# Patient Record
Sex: Male | Born: 1945 | Race: White | Hispanic: No | Marital: Married | Smoking: Former smoker
Health system: Southern US, Community
[De-identification: ages and names within clinical notes are randomized; demographics above are authoritative.]

## PROBLEM LIST (undated history)

## (undated) DIAGNOSIS — I209 Angina pectoris, unspecified: Secondary | ICD-10-CM

## (undated) DIAGNOSIS — K922 Gastrointestinal hemorrhage, unspecified: Secondary | ICD-10-CM

## (undated) DIAGNOSIS — R609 Edema, unspecified: Secondary | ICD-10-CM

## (undated) DIAGNOSIS — E119 Type 2 diabetes mellitus without complications: Secondary | ICD-10-CM

## (undated) DIAGNOSIS — I509 Heart failure, unspecified: Secondary | ICD-10-CM

## (undated) DIAGNOSIS — E039 Hypothyroidism, unspecified: Secondary | ICD-10-CM

## (undated) DIAGNOSIS — I890 Lymphedema, not elsewhere classified: Secondary | ICD-10-CM

## (undated) DIAGNOSIS — I2510 Atherosclerotic heart disease of native coronary artery without angina pectoris: Secondary | ICD-10-CM

## (undated) DIAGNOSIS — K219 Gastro-esophageal reflux disease without esophagitis: Secondary | ICD-10-CM

## (undated) DIAGNOSIS — E860 Dehydration: Secondary | ICD-10-CM

## (undated) DIAGNOSIS — I219 Acute myocardial infarction, unspecified: Secondary | ICD-10-CM

## (undated) DIAGNOSIS — G4730 Sleep apnea, unspecified: Secondary | ICD-10-CM

## (undated) DIAGNOSIS — I4891 Unspecified atrial fibrillation: Secondary | ICD-10-CM

## (undated) DIAGNOSIS — I10 Essential (primary) hypertension: Secondary | ICD-10-CM

## (undated) DIAGNOSIS — G2581 Restless legs syndrome: Secondary | ICD-10-CM

## (undated) DIAGNOSIS — F32A Depression, unspecified: Secondary | ICD-10-CM

## (undated) DIAGNOSIS — F329 Major depressive disorder, single episode, unspecified: Secondary | ICD-10-CM

## (undated) HISTORY — PX: CARPAL TUNNEL RELEASE: SHX101

## (undated) HISTORY — PX: COSMETIC SURGERY: SHX468

## (undated) HISTORY — PX: GASTRIC BYPASS: SHX52

## (undated) HISTORY — PX: PACEMAKER INSERTION: SHX728

---

## 2010-02-10 DIAGNOSIS — I2510 Atherosclerotic heart disease of native coronary artery without angina pectoris: Secondary | ICD-10-CM | POA: Insufficient documentation

## 2011-03-23 DIAGNOSIS — K922 Gastrointestinal hemorrhage, unspecified: Secondary | ICD-10-CM | POA: Insufficient documentation

## 2013-02-14 DIAGNOSIS — I4891 Unspecified atrial fibrillation: Secondary | ICD-10-CM | POA: Insufficient documentation

## 2013-02-23 DIAGNOSIS — D509 Iron deficiency anemia, unspecified: Secondary | ICD-10-CM | POA: Insufficient documentation

## 2013-03-12 DIAGNOSIS — I5032 Chronic diastolic (congestive) heart failure: Secondary | ICD-10-CM | POA: Insufficient documentation

## 2014-01-31 DIAGNOSIS — R768 Other specified abnormal immunological findings in serum: Secondary | ICD-10-CM | POA: Insufficient documentation

## 2015-06-10 DIAGNOSIS — I5032 Chronic diastolic (congestive) heart failure: Secondary | ICD-10-CM | POA: Diagnosis not present

## 2015-06-10 DIAGNOSIS — Z45018 Encounter for adjustment and management of other part of cardiac pacemaker: Secondary | ICD-10-CM | POA: Diagnosis not present

## 2015-06-28 DIAGNOSIS — R5383 Other fatigue: Secondary | ICD-10-CM | POA: Diagnosis not present

## 2015-06-28 DIAGNOSIS — I10 Essential (primary) hypertension: Secondary | ICD-10-CM | POA: Diagnosis not present

## 2015-06-28 DIAGNOSIS — G4700 Insomnia, unspecified: Secondary | ICD-10-CM | POA: Diagnosis not present

## 2015-06-28 DIAGNOSIS — K219 Gastro-esophageal reflux disease without esophagitis: Secondary | ICD-10-CM | POA: Diagnosis not present

## 2015-07-14 DIAGNOSIS — I5032 Chronic diastolic (congestive) heart failure: Secondary | ICD-10-CM | POA: Diagnosis not present

## 2015-07-23 ENCOUNTER — Inpatient Hospital Stay
Admission: EM | Admit: 2015-07-23 | Discharge: 2015-07-26 | DRG: 564 | Disposition: A | Discharge status: Home / Self Care | Payer: Medicare Other | Attending: Internal Medicine | Admitting: Internal Medicine

## 2015-07-23 ENCOUNTER — Emergency Department: Payer: Medicare Other

## 2015-07-23 DIAGNOSIS — G2581 Restless legs syndrome: Secondary | ICD-10-CM | POA: Diagnosis present

## 2015-07-23 DIAGNOSIS — W19XXXA Unspecified fall, initial encounter: Secondary | ICD-10-CM | POA: Diagnosis present

## 2015-07-23 DIAGNOSIS — R911 Solitary pulmonary nodule: Secondary | ICD-10-CM | POA: Diagnosis present

## 2015-07-23 DIAGNOSIS — E860 Dehydration: Secondary | ICD-10-CM | POA: Diagnosis present

## 2015-07-23 DIAGNOSIS — I110 Hypertensive heart disease with heart failure: Secondary | ICD-10-CM | POA: Diagnosis not present

## 2015-07-23 DIAGNOSIS — I2510 Atherosclerotic heart disease of native coronary artery without angina pectoris: Secondary | ICD-10-CM | POA: Diagnosis present

## 2015-07-23 DIAGNOSIS — M6282 Rhabdomyolysis: Secondary | ICD-10-CM | POA: Diagnosis not present

## 2015-07-23 DIAGNOSIS — Z8673 Personal history of transient ischemic attack (TIA), and cerebral infarction without residual deficits: Secondary | ICD-10-CM | POA: Diagnosis not present

## 2015-07-23 DIAGNOSIS — T796XXA Traumatic ischemia of muscle, initial encounter: Principal | ICD-10-CM | POA: Diagnosis present

## 2015-07-23 DIAGNOSIS — E669 Obesity, unspecified: Secondary | ICD-10-CM | POA: Diagnosis present

## 2015-07-23 DIAGNOSIS — R778 Other specified abnormalities of plasma proteins: Secondary | ICD-10-CM

## 2015-07-23 DIAGNOSIS — E119 Type 2 diabetes mellitus without complications: Secondary | ICD-10-CM | POA: Diagnosis present

## 2015-07-23 DIAGNOSIS — Z6841 Body Mass Index (BMI) 40.0 and over, adult: Secondary | ICD-10-CM

## 2015-07-23 DIAGNOSIS — Z833 Family history of diabetes mellitus: Secondary | ICD-10-CM | POA: Diagnosis not present

## 2015-07-23 DIAGNOSIS — Z66 Do not resuscitate: Secondary | ICD-10-CM | POA: Diagnosis present

## 2015-07-23 DIAGNOSIS — F329 Major depressive disorder, single episode, unspecified: Secondary | ICD-10-CM | POA: Diagnosis present

## 2015-07-23 DIAGNOSIS — M79601 Pain in right arm: Secondary | ICD-10-CM | POA: Diagnosis not present

## 2015-07-23 DIAGNOSIS — G9341 Metabolic encephalopathy: Secondary | ICD-10-CM | POA: Diagnosis not present

## 2015-07-23 DIAGNOSIS — S0081XA Abrasion of other part of head, initial encounter: Secondary | ICD-10-CM | POA: Diagnosis present

## 2015-07-23 DIAGNOSIS — S0993XA Unspecified injury of face, initial encounter: Secondary | ICD-10-CM | POA: Diagnosis not present

## 2015-07-23 DIAGNOSIS — R296 Repeated falls: Secondary | ICD-10-CM | POA: Diagnosis not present

## 2015-07-23 DIAGNOSIS — R55 Syncope and collapse: Secondary | ICD-10-CM | POA: Diagnosis not present

## 2015-07-23 DIAGNOSIS — G4733 Obstructive sleep apnea (adult) (pediatric): Secondary | ICD-10-CM | POA: Diagnosis present

## 2015-07-23 DIAGNOSIS — I5032 Chronic diastolic (congestive) heart failure: Secondary | ICD-10-CM | POA: Diagnosis present

## 2015-07-23 DIAGNOSIS — R4182 Altered mental status, unspecified: Secondary | ICD-10-CM | POA: Diagnosis present

## 2015-07-23 DIAGNOSIS — R748 Abnormal levels of other serum enzymes: Secondary | ICD-10-CM | POA: Diagnosis present

## 2015-07-23 DIAGNOSIS — S61511A Laceration without foreign body of right wrist, initial encounter: Secondary | ICD-10-CM | POA: Diagnosis present

## 2015-07-23 DIAGNOSIS — I10 Essential (primary) hypertension: Secondary | ICD-10-CM

## 2015-07-23 DIAGNOSIS — Z882 Allergy status to sulfonamides status: Secondary | ICD-10-CM

## 2015-07-23 DIAGNOSIS — R7989 Other specified abnormal findings of blood chemistry: Secondary | ICD-10-CM

## 2015-07-23 DIAGNOSIS — R531 Weakness: Secondary | ICD-10-CM | POA: Diagnosis not present

## 2015-07-23 DIAGNOSIS — I890 Lymphedema, not elsewhere classified: Secondary | ICD-10-CM | POA: Diagnosis present

## 2015-07-23 DIAGNOSIS — S199XXA Unspecified injury of neck, initial encounter: Secondary | ICD-10-CM | POA: Diagnosis not present

## 2015-07-23 DIAGNOSIS — I4891 Unspecified atrial fibrillation: Secondary | ICD-10-CM | POA: Diagnosis not present

## 2015-07-23 DIAGNOSIS — Z87891 Personal history of nicotine dependence: Secondary | ICD-10-CM | POA: Diagnosis not present

## 2015-07-23 DIAGNOSIS — E039 Hypothyroidism, unspecified: Secondary | ICD-10-CM | POA: Diagnosis not present

## 2015-07-23 DIAGNOSIS — Z79899 Other long term (current) drug therapy: Secondary | ICD-10-CM

## 2015-07-23 DIAGNOSIS — Z950 Presence of cardiac pacemaker: Secondary | ICD-10-CM | POA: Diagnosis not present

## 2015-07-23 DIAGNOSIS — I214 Non-ST elevation (NSTEMI) myocardial infarction: Secondary | ICD-10-CM | POA: Diagnosis not present

## 2015-07-23 DIAGNOSIS — R609 Edema, unspecified: Secondary | ICD-10-CM | POA: Diagnosis present

## 2015-07-23 DIAGNOSIS — S299XXA Unspecified injury of thorax, initial encounter: Secondary | ICD-10-CM | POA: Diagnosis not present

## 2015-07-23 DIAGNOSIS — S3993XA Unspecified injury of pelvis, initial encounter: Secondary | ICD-10-CM | POA: Diagnosis not present

## 2015-07-23 HISTORY — DX: Unspecified atrial fibrillation: I48.91

## 2015-07-23 HISTORY — DX: Type 2 diabetes mellitus without complications: E11.9

## 2015-07-23 HISTORY — DX: Atherosclerotic heart disease of native coronary artery without angina pectoris: I25.10

## 2015-07-23 HISTORY — DX: Essential (primary) hypertension: I10

## 2015-07-23 HISTORY — DX: Edema, unspecified: R60.9

## 2015-07-23 HISTORY — DX: Heart failure, unspecified: I50.9

## 2015-07-23 HISTORY — DX: Sleep apnea, unspecified: G47.30

## 2015-07-23 LAB — URINE DRUG SCREEN, QUALITATIVE (ARMC ONLY)
AMPHETAMINES, UR SCREEN: NOT DETECTED
BENZODIAZEPINE, UR SCRN: POSITIVE — AB
Barbiturates, Ur Screen: NOT DETECTED
Cannabinoid 50 Ng, Ur ~~LOC~~: NOT DETECTED
Cocaine Metabolite,Ur ~~LOC~~: NOT DETECTED
MDMA (Ecstasy)Ur Screen: NOT DETECTED
METHADONE SCREEN, URINE: NOT DETECTED
Opiate, Ur Screen: NOT DETECTED
Phencyclidine (PCP) Ur S: NOT DETECTED
Tricyclic, Ur Screen: NOT DETECTED

## 2015-07-23 LAB — COMPREHENSIVE METABOLIC PANEL
ALK PHOS: 67 U/L (ref 38–126)
ALT: 28 U/L (ref 17–63)
AST: 78 U/L — AB (ref 15–41)
Albumin: 4.4 g/dL (ref 3.5–5.0)
Anion gap: 6 (ref 5–15)
BUN: 32 mg/dL — AB (ref 6–20)
CHLORIDE: 104 mmol/L (ref 101–111)
CO2: 30 mmol/L (ref 22–32)
CREATININE: 1.01 mg/dL (ref 0.61–1.24)
Calcium: 8.7 mg/dL — ABNORMAL LOW (ref 8.9–10.3)
GFR calc Af Amer: >60 mL/min (ref >60)
GFR calc non Af Amer: >60 mL/min (ref >60)
Glucose, Bld: 119 mg/dL — ABNORMAL HIGH (ref 65–99)
Potassium: 3.9 mmol/L (ref 3.5–5.1)
SODIUM: 140 mmol/L (ref 135–145)
Total Bilirubin: 2.2 mg/dL — ABNORMAL HIGH (ref 0.3–1.2)
Total Protein: 7.6 g/dL (ref 6.5–8.1)

## 2015-07-23 LAB — URINALYSIS COMPLETE WITH MICROSCOPIC (ARMC ONLY)
BILIRUBIN URINE: NEGATIVE
GLUCOSE, UA: NEGATIVE mg/dL
HGB URINE DIPSTICK: NEGATIVE
LEUKOCYTES UA: NEGATIVE
Nitrite: NEGATIVE
Protein, ur: NEGATIVE mg/dL
SPECIFIC GRAVITY, URINE: 1.017 (ref 1.005–1.030)
pH: 5 (ref 5.0–8.0)

## 2015-07-23 LAB — CBC WITH DIFFERENTIAL/PLATELET
Basophils Absolute: 0 10*3/uL (ref 0–0.1)
Basophils Relative: 1 %
EOS PCT: 1 %
Eosinophils Absolute: 0.1 10*3/uL (ref 0–0.7)
HCT: 41.7 % (ref 40.0–52.0)
HEMOGLOBIN: 14.3 g/dL (ref 13.0–18.0)
LYMPHS ABS: 0.5 10*3/uL — AB (ref 1.0–3.6)
LYMPHS PCT: 5 %
MCH: 31.9 pg (ref 26.0–34.0)
MCHC: 34.4 g/dL (ref 32.0–36.0)
MCV: 92.8 fL (ref 80.0–100.0)
MONOS PCT: 9 %
Monocytes Absolute: 0.9 10*3/uL (ref 0.2–1.0)
Neutro Abs: 8.7 10*3/uL — ABNORMAL HIGH (ref 1.4–6.5)
Neutrophils Relative %: 84 %
PLATELETS: 158 10*3/uL (ref 150–440)
RBC: 4.49 MIL/uL (ref 4.40–5.90)
RDW: 14.6 % — ABNORMAL HIGH (ref 11.5–14.5)
WBC: 10.3 10*3/uL (ref 3.8–10.6)

## 2015-07-23 LAB — TYPE AND SCREEN
ABO/RH(D): A POS
Antibody Screen: NEGATIVE

## 2015-07-23 LAB — LIPASE, BLOOD: Lipase: 14 U/L (ref 11–51)

## 2015-07-23 LAB — ETHANOL

## 2015-07-23 LAB — GLUCOSE, CAPILLARY: Glucose-Capillary: 90 mg/dL (ref 65–99)

## 2015-07-23 LAB — CK: Total CK: 2139 U/L — ABNORMAL HIGH (ref 49–397)

## 2015-07-23 LAB — TROPONIN I: TROPONIN I: 0.34 ng/mL — AB (ref <0.031)

## 2015-07-23 LAB — ABO/RH: ABO/RH(D): A POS

## 2015-07-23 MED ORDER — LIDOCAINE-EPINEPHRINE (PF) 1 %-1:200000 IJ SOLN
INTRAMUSCULAR | Status: AC
  Administered 2015-07-23: 21:00:00
  Filled 2015-07-23: qty 30

## 2015-07-23 NOTE — ED Notes (Signed)
Per wife, states she believes pt had been on the ground since Thursday, states pt is lethargic and not at his baseline

## 2015-07-23 NOTE — ED Notes (Signed)
Notified of elevated troponin, EDP made aware

## 2015-07-23 NOTE — ED Notes (Signed)
MD at bedside. 

## 2015-07-23 NOTE — ED Notes (Signed)
Pt from home via EMS, reports wife was out of town and pt was at home by himself, wife reports when she got home she found pt on the floor covered in blood but awake. Pt states when his HGB is low he passes out. Pt lost consciousness, fell and broke lamp which cut his R. Wrist. Bleeding is controlled on assessment. Pt was incontinent upon arrival . Pt also presents with extensive bruising on abd and arms, 4+ edema in all extremities. Pt has hx of CHF and pacemaker. Pt A&O X4. Pt states he was on the floor for approx. 2 hrs before wife found him

## 2015-07-23 NOTE — ED Provider Notes (Signed)
[facility] Emergency Department Provider Note  ____________________________________________  Time seen: Approximately 815 PM  I have reviewed the triage vital signs and the nursing notes.   HISTORY  Chief Complaint Fall and Laceration   HPI Jesse [name] is a 70 y.o. male with a history of hypertension, diabetes and CHF who is presenting to the emergency department today after syncopal episode. The patient says that he thinks he passed out because he is anemic. He denies any chest pain or shortness of breath. Says that he has had to have blood transfusions in the past because of a chronic anemia. He says that he was probably lying on the ground for several hours. Denies any headache or pain at this time. Says that he thinks that he fell and cut his right wrist on a lamp. He denies any suicidal or homicidal ideation. He denies attempting to cut his wrist.   Past Medical History  Diagnosis Date  . Hypertension   . Diabetes mellitus without complication (HCC)   . CHF (congestive heart failure) (HCC)     There are no active problems to display for this patient.   No past surgical history on file.  No current outpatient prescriptions on file.  Allergies Sulfa antibiotics  No family history on file.  Social History Social History  Substance Use Topics  . Smoking status: Never Smoker   . Smokeless tobacco: None  . Alcohol Use: Yes    Review of Systems Constitutional: No fever/chills Eyes: No visual changes. ENT: No sore throat. Cardiovascular: Denies chest pain. Respiratory: Denies shortness of breath. Gastrointestinal: No abdominal pain.  No nausea, no vomiting.  No diarrhea.  No constipation. Genitourinary: Negative for dysuria. Musculoskeletal: Negative for back pain. Skin: Negative for rash. Neurological: Negative for headaches, focal weakness or numbness.  10-point ROS otherwise  negative.  ____________________________________________   PHYSICAL EXAM:  VITAL SIGNS: ED Triage Vitals  Enc Vitals Group     BP 07/23/15 1955 133/77 mmHg     Pulse Rate 07/23/15 1955 78     Resp 07/23/15 1959 14     Temp 07/23/15 1945 97.8 F (36.6 C)     Temp Source 07/23/15 1945 Axillary     SpO2 07/23/15 1955 99 %     Weight --      Height --      Head Cir --      Peak Flow --      Pain Score --      Pain Loc --      Pain Edu? --      Excl. in GC? --     Constitutional: Alert and oriented. Patient is covered in blood. He told the nurse that she was "floating like an Chief Technology Officer." Eyes: Conjunctivae are normal. PERRL. EOMI. Head: Atraumatic. Nose: Blood in the bilateral nares. However, no active bleeding. No tenderness or deformity. No nasal septal hematoma. Mouth/Throat: Mucous membranes are moist.  Oropharynx non-erythematous. Neck: No stridor.  Nontender to palpation. Cardiovascular: Normal rate, regular rhythm. Grossly normal heart sounds.  Good peripheral circulation. Respiratory: Normal respiratory effort.  No retractions. Lungs CTAB. Gastrointestinal: Soft and nontender. No distention. No abdominal bruits. No CVA tenderness. Musculoskeletal: Rennis Harding entices to the bilateral lower extremities. Nontender to palpation. Not erythematous, warm and without any induration.  No joint effusions. Neurologic:  Normal speech and language. No gross focal neurologic deficits are appreciated.  Skin:  Right wrist with medial to lateral laceration which is linear and without any pulsatile bleeding.  It is to the subcutaneous layer and there is no exposed tendon or bone. Patient is neurovascularly intact distal to the injury. Psychiatric: Mood and affect are normal. Speech and behavior are normal.  ____________________________________________   LABS (all labs ordered are listed, but only abnormal results are displayed)  Labs Reviewed  CBC WITH DIFFERENTIAL/PLATELET - Abnormal; Notable  for the following:    RDW 14.6 (*)    Neutro Abs 8.7 (*)    Lymphs Abs 0.5 (*)    All other components within normal limits  GLUCOSE, CAPILLARY  TROPONIN I  COMPREHENSIVE METABOLIC PANEL  LIPASE, BLOOD  ETHANOL  URINE DRUG SCREEN, QUALITATIVE ([facility] ONLY)  URINALYSIS COMPLETEWITH MICROSCOPIC ([facility] ONLY)  CK  TYPE AND SCREEN   ____________________________________________  EKG  ED ECG REPORT I, Arelia Longest, the attending physician, personally viewed and interpreted this ECG.   Date: 07/23/2015  EKG Time: 1956  Rate: 85  Rhythm: Ventricular paced rhythm   Intervals:Left bundle branch block pattern consistent with ventricular pacing.  ST&T Change: No ST segment elevation or depression. T-wave inversion in aVL.  ____________________________________________  RADIOLOGY      CT Head Wo Contrast (Final result) Result time: 07/23/15 81:19:14   Final result by Rad Results In Interface (07/23/15 78:29:56)   Narrative:   CLINICAL DATA: Patient found unresponsive on the floor with blood on his face.  EXAM: CT HEAD WITHOUT CONTRAST  CT MAXILLOFACIAL WITHOUT CONTRAST  CT CERVICAL SPINE WITHOUT CONTRAST  TECHNIQUE: Multidetector CT imaging of the head, cervical spine, and maxillofacial structures were performed using the standard protocol without intravenous contrast. Multiplanar CT image reconstructions of the cervical spine and maxillofacial structures were also generated.  COMPARISON: None.  FINDINGS: CT HEAD FINDINGS  Ventricles and sulci are appropriate for patient's age. 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 No evidence for acute intracranial hemorrhage, mass lesion or mass-effect. Orbits are unremarkable. Scalp soft tissues are unremarkable. Mucosal thickening involving the left maxillary sinus. Mastoid air cells unremarkable. Calvarium is intact.  CT MAXILLOFACIAL FINDINGS  Mucosal thickening involving the left maxillary  sinus. Zygomatic archs and pterygoid plates are intact. Maxilla and mandible are intact. Multiple missing teeth are demonstrated. Multiple dental caries. No evidence for acute maxillofacial fracture.  CT CERVICAL SPINE FINDINGS  Straightening of the normal cervical lordosis. Multilevel degenerative disc disease and facet disease. Preservation of the vertebral body heights. Craniocervical junction is intact. No evidence for acute cervical spine fracture. In the right upper lobe there is a 8 x 7 mm nodule (8 mm mean diameter) on image 94 of series 13. Prevertebral soft tissues unremarkable.  IMPRESSION: No acute intracranial process.  No acute cervical spine fracture.  No acute maxillofacial fracture.  Mucosal thickening left maxillary sinus.  There is an 8 mm right upper lobe pulmonary nodule. Non-contrast chest CT at 6-12 months is recommended. If the nodule is stable at time of repeat CT, then future CT at 18-24 months (from today's scan) is considered optional for low-risk patients, but is recommended for high-risk patients. This recommendation follows the consensus statement: Guidelines for Management of Incidental Pulmonary Nodules Detected on CT Images:From the Fleischner Society 2017; published online before print (10.1148/[id number]).   Electronically Signed By: Annia Belt M.D. On: 07/23/2015 22:08          CT Cervical Spine Wo Contrast (Final result) Result time: 07/23/15 69:62:95   Final result by Rad Results In Interface (07/23/15 28:41:32)   Narrative:   CLINICAL DATA: Patient found  unresponsive on the floor with blood on his face.  EXAM: CT HEAD WITHOUT CONTRAST  CT MAXILLOFACIAL WITHOUT CONTRAST  CT CERVICAL SPINE WITHOUT CONTRAST  TECHNIQUE: Multidetector CT imaging of the head, cervical spine, and maxillofacial structures were performed using the standard protocol without intravenous contrast. Multiplanar CT image  reconstructions of the cervical spine and maxillofacial structures were also generated.  COMPARISON: None.  FINDINGS: CT HEAD FINDINGS  Ventricles and sulci are appropriate for patient's age. 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 No evidence for acute intracranial hemorrhage, mass lesion or mass-effect. Orbits are unremarkable. Scalp soft tissues are unremarkable. Mucosal thickening involving the left maxillary sinus. Mastoid air cells unremarkable. Calvarium is intact.  CT MAXILLOFACIAL FINDINGS  Mucosal thickening involving the left maxillary sinus. Zygomatic archs and pterygoid plates are intact. Maxilla and mandible are intact. Multiple missing teeth are demonstrated. Multiple dental caries. No evidence for acute maxillofacial fracture.  CT CERVICAL SPINE FINDINGS  Straightening of the normal cervical lordosis. Multilevel degenerative disc disease and facet disease. Preservation of the vertebral body heights. Craniocervical junction is intact. No evidence for acute cervical spine fracture. In the right upper lobe there is a 8 x 7 mm nodule (8 mm mean diameter) on image 94 of series 13. Prevertebral soft tissues unremarkable.  IMPRESSION: No acute intracranial process.  No acute cervical spine fracture.  No acute maxillofacial fracture.  Mucosal thickening left maxillary sinus.  There is an 8 mm right upper lobe pulmonary nodule. Non-contrast chest CT at 6-12 months is recommended. If the nodule is stable at time of repeat CT, then future CT at 18-24 months (from today's scan) is considered optional for low-risk patients, but is recommended for high-risk patients. This recommendation follows the consensus statement: Guidelines for Management of Incidental Pulmonary Nodules Detected on CT Images:From the Fleischner Society 2017; published online before print (10.1148/[id number]).   Electronically Signed By: Annia Beltrew Davis M.D. On:  07/23/2015 22:08          CT Maxillofacial WO CM (Final result) Result time: 07/23/15 09:81:1922:08:22   Final result by Rad Results In Interface (07/23/15 14:78:2922:08:22)   Narrative:   CLINICAL DATA: Patient found unresponsive on the floor with blood on his face.  EXAM: CT HEAD WITHOUT CONTRAST  CT MAXILLOFACIAL WITHOUT CONTRAST  CT CERVICAL SPINE WITHOUT CONTRAST  TECHNIQUE: Multidetector CT imaging of the head, cervical spine, and maxillofacial structures were performed using the standard protocol without intravenous contrast. Multiplanar CT image reconstructions of the cervical spine and maxillofacial structures were also generated.  COMPARISON: None.  FINDINGS: CT HEAD FINDINGS  Ventricles and sulci are appropriate for patient's age. 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 No evidence for acute intracranial hemorrhage, mass lesion or mass-effect. Orbits are unremarkable. Scalp soft tissues are unremarkable. Mucosal thickening involving the left maxillary sinus. Mastoid air cells unremarkable. Calvarium is intact.  CT MAXILLOFACIAL FINDINGS  Mucosal thickening involving the left maxillary sinus. Zygomatic archs and pterygoid plates are intact. Maxilla and mandible are intact. Multiple missing teeth are demonstrated. Multiple dental caries. No evidence for acute maxillofacial fracture.  CT CERVICAL SPINE FINDINGS  Straightening of the normal cervical lordosis. Multilevel degenerative disc disease and facet disease. Preservation of the vertebral body heights. Craniocervical junction is intact. No evidence for acute cervical spine fracture. In the right upper lobe there is a 8 x 7 mm nodule (8 mm mean diameter) on image 94 of series 13. Prevertebral soft tissues unremarkable.  IMPRESSION: No acute intracranial process.  No acute cervical spine fracture.  No acute maxillofacial fracture.  Mucosal thickening left maxillary sinus.  There is an 8  mm right upper lobe pulmonary nodule. Non-contrast chest CT at 6-12 months is recommended. If the nodule is stable at time of repeat CT, then future CT at 18-24 months (from today's scan) is considered optional for low-risk patients, but is recommended for high-risk patients. This recommendation follows the consensus statement: Guidelines for Management of Incidental Pulmonary Nodules Detected on CT Images:From the Fleischner Society 2017; published online before print (10.1148/[id number]).   Electronically Signed By: Annia Belt M.D. On: 07/23/2015 22:08          DG Chest 2 View (Final result) Result time: 07/23/15 21:31:25   Final result by Rad Results In Interface (07/23/15 21:31:25)   Narrative:   CLINICAL DATA: Found on floor at home.  EXAM: CHEST 2 VIEW  COMPARISON: None  FINDINGS: There is moderate cardiomegaly. There are grossly intact appearances of the visible portions of the transvenous cardiac lead. The lungs are clear. The pulmonary vasculature is normal. There is no pleural effusion. Hilar and mediastinal contours are unremarkable.  IMPRESSION: Cardiomegaly. No acute cardiopulmonary findings.   Electronically Signed By: Ellery Plunk M.D. On: 07/23/2015 21:31          DG Pelvis 1-2 Views (Final result) Result time: 07/23/15 21:31:59   Final result by Rad Results In Interface (07/23/15 21:31:59)   Narrative:   CLINICAL DATA: Found on floor at home  EXAM: PELVIS - 1-2 VIEW  COMPARISON: None  FINDINGS: Negative for acute fracture dislocation. Sacroiliac joints and pubic symphysis appear intact. No soft tissue foreign body.  IMPRESSION: Negative.   Electronically Signed By: Ellery Plunk M.D. On: 07/23/2015 21:31    ____________________________________________   PROCEDURES  LACERATION REPAIR Performed by: Arelia Longest Authorized by: Arelia Longest Consent: Verbal consent  obtained. Risks and benefits: risks, benefits and alternatives were discussed Consent given by: patient Patient identity confirmed: provided demographic data Prepped and Draped in normal sterile fashion Wound explored  Laceration Location: Right wrist, volar surface  Laceration Length: 6 cm  No Foreign Bodies seen or palpated  Anesthesia: local infiltration  Local anesthetic: lidocaine 1 % with epinephrine  Anesthetic total: 4 ml  Irrigation method: syringe Amount of cleaning: standard  Skin closure: 4-0 nylon   Number of sutures: 5   Technique: Simple interrupted   Patient tolerance: Patient tolerated the procedure well with no immediate complications.  ____________________________________________   INITIAL IMPRESSION / ASSESSMENT AND PLAN / ED COURSE  Pertinent labs & imaging results that were available during my care of the patient were reviewed by me and considered in my medical decision making (see chart for details).  ----------------------------------------- 12:15 AM on 07/24/2015 -----------------------------------------  Patient remains at his baseline mental status since he came to the emergency department here today. His family is now the bedside and says that he is acting abnormal. They stated they think that this happened on Thursday evening. The patient continues to have a nonfocal neurologic exam.  He was found to have an increased creatinine kinase as well as troponin. He is still denying any chest pain. I discussed the case with Dr. Welton Flakes of cardiology who thinks that the elevated troponin is secondary to rhabdo. Due to the patient's history of GI bleed we'll be holding off on any anticoagulation at this time. I explain the plan for remission to the wife as well as the family and they're understanding  one to comply. They're concerned that the patient may have had a stroke. 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 I  discussed with them that the inpatient team would likely pursue further workup.  We discussed possible transfer to the [facility] with the patient has had care in the past. However, the family is concerned about a delay in transfer which would delay care. To the hospital here in [place]. Signed out to Dr. Tobi Bastos. Unclear what it initially precipitated the fall. Possible syncope. ____________________________________________   FINAL CLINICAL IMPRESSION(S) / ED DIAGNOSES  Altered mental status. Right wrist laceration. Rhabdomyolysis.    Myrna Blazer, MD 07/24/15 [phone]

## 2015-07-23 NOTE — ED Notes (Signed)
Pt incontinent of stool and urine, pt cleaned, redness noted to sacrum

## 2015-07-23 NOTE — ED Notes (Addendum)
Emergency planning/management officerolice officer at bedside. Pt denies SI upon assessment

## 2015-07-23 NOTE — ED Notes (Signed)
This RN attempted top interrogate pacemaker, unable to. EDP made aware. Family unsure of what type of pacemaker pt has

## 2015-07-23 NOTE — ED Notes (Signed)
Patient transported to X-ray 

## 2015-07-24 ENCOUNTER — Encounter: Payer: Self-pay | Admitting: Internal Medicine

## 2015-07-24 ENCOUNTER — Inpatient Hospital Stay
Admit: 2015-07-24 | Discharge: 2015-07-24 | Disposition: A | Discharge status: Home / Self Care | Payer: Medicare Other | Attending: Cardiovascular Disease | Admitting: Cardiovascular Disease

## 2015-07-24 DIAGNOSIS — R55 Syncope and collapse: Secondary | ICD-10-CM | POA: Diagnosis present

## 2015-07-24 DIAGNOSIS — I4891 Unspecified atrial fibrillation: Secondary | ICD-10-CM | POA: Diagnosis present

## 2015-07-24 DIAGNOSIS — S61511A Laceration without foreign body of right wrist, initial encounter: Secondary | ICD-10-CM | POA: Diagnosis present

## 2015-07-24 DIAGNOSIS — W19XXXA Unspecified fall, initial encounter: Secondary | ICD-10-CM | POA: Diagnosis present

## 2015-07-24 DIAGNOSIS — G2581 Restless legs syndrome: Secondary | ICD-10-CM | POA: Diagnosis present

## 2015-07-24 DIAGNOSIS — Z79899 Other long term (current) drug therapy: Secondary | ICD-10-CM | POA: Diagnosis not present

## 2015-07-24 DIAGNOSIS — Z833 Family history of diabetes mellitus: Secondary | ICD-10-CM | POA: Diagnosis not present

## 2015-07-24 DIAGNOSIS — E860 Dehydration: Secondary | ICD-10-CM | POA: Diagnosis present

## 2015-07-24 DIAGNOSIS — R748 Abnormal levels of other serum enzymes: Secondary | ICD-10-CM | POA: Diagnosis present

## 2015-07-24 DIAGNOSIS — R911 Solitary pulmonary nodule: Secondary | ICD-10-CM | POA: Diagnosis present

## 2015-07-24 DIAGNOSIS — I110 Hypertensive heart disease with heart failure: Secondary | ICD-10-CM | POA: Diagnosis present

## 2015-07-24 DIAGNOSIS — I890 Lymphedema, not elsewhere classified: Secondary | ICD-10-CM | POA: Diagnosis present

## 2015-07-24 DIAGNOSIS — G4733 Obstructive sleep apnea (adult) (pediatric): Secondary | ICD-10-CM | POA: Diagnosis present

## 2015-07-24 DIAGNOSIS — I5032 Chronic diastolic (congestive) heart failure: Secondary | ICD-10-CM | POA: Diagnosis present

## 2015-07-24 DIAGNOSIS — Z6841 Body Mass Index (BMI) 40.0 and over, adult: Secondary | ICD-10-CM | POA: Diagnosis not present

## 2015-07-24 DIAGNOSIS — Z66 Do not resuscitate: Secondary | ICD-10-CM | POA: Diagnosis present

## 2015-07-24 DIAGNOSIS — R296 Repeated falls: Secondary | ICD-10-CM | POA: Diagnosis present

## 2015-07-24 DIAGNOSIS — Z87891 Personal history of nicotine dependence: Secondary | ICD-10-CM | POA: Diagnosis not present

## 2015-07-24 DIAGNOSIS — E669 Obesity, unspecified: Secondary | ICD-10-CM | POA: Diagnosis present

## 2015-07-24 DIAGNOSIS — R4182 Altered mental status, unspecified: Secondary | ICD-10-CM | POA: Diagnosis present

## 2015-07-24 DIAGNOSIS — M6282 Rhabdomyolysis: Secondary | ICD-10-CM | POA: Diagnosis present

## 2015-07-24 DIAGNOSIS — S0081XA Abrasion of other part of head, initial encounter: Secondary | ICD-10-CM | POA: Diagnosis present

## 2015-07-24 DIAGNOSIS — E119 Type 2 diabetes mellitus without complications: Secondary | ICD-10-CM | POA: Diagnosis present

## 2015-07-24 DIAGNOSIS — G9341 Metabolic encephalopathy: Secondary | ICD-10-CM | POA: Diagnosis present

## 2015-07-24 DIAGNOSIS — E039 Hypothyroidism, unspecified: Secondary | ICD-10-CM | POA: Diagnosis present

## 2015-07-24 DIAGNOSIS — F329 Major depressive disorder, single episode, unspecified: Secondary | ICD-10-CM | POA: Diagnosis present

## 2015-07-24 DIAGNOSIS — R609 Edema, unspecified: Secondary | ICD-10-CM | POA: Diagnosis present

## 2015-07-24 DIAGNOSIS — I2510 Atherosclerotic heart disease of native coronary artery without angina pectoris: Secondary | ICD-10-CM | POA: Diagnosis present

## 2015-07-24 DIAGNOSIS — Z882 Allergy status to sulfonamides status: Secondary | ICD-10-CM | POA: Diagnosis not present

## 2015-07-24 DIAGNOSIS — Z950 Presence of cardiac pacemaker: Secondary | ICD-10-CM | POA: Diagnosis not present

## 2015-07-24 DIAGNOSIS — T796XXA Traumatic ischemia of muscle, initial encounter: Secondary | ICD-10-CM | POA: Diagnosis present

## 2015-07-24 DIAGNOSIS — Z8673 Personal history of transient ischemic attack (TIA), and cerebral infarction without residual deficits: Secondary | ICD-10-CM | POA: Diagnosis not present

## 2015-07-24 LAB — BLOOD GAS, ARTERIAL
ACID-BASE EXCESS: 3.3 mmol/L — AB (ref 0.0–3.0)
Allens test (pass/fail): POSITIVE — AB
BICARBONATE: 28.5 meq/L — AB (ref 21.0–28.0)
DELIVERY SYSTEMS: POSITIVE
Expiratory PAP: 15.6
FIO2: 0.28
O2 SAT: 85.4 %
PATIENT TEMPERATURE: 37
pCO2 arterial: 45 mmHg (ref 32.0–48.0)
pH, Arterial: 7.41 (ref 7.350–7.450)
pO2, Arterial: 50 mmHg — ABNORMAL LOW (ref 83.0–108.0)

## 2015-07-24 LAB — BASIC METABOLIC PANEL
ANION GAP: 9 (ref 5–15)
BUN: 30 mg/dL — AB (ref 6–20)
CO2: 22 mmol/L (ref 22–32)
Calcium: 8.5 mg/dL — ABNORMAL LOW (ref 8.9–10.3)
Chloride: 111 mmol/L (ref 101–111)
Creatinine, Ser: 0.96 mg/dL (ref 0.61–1.24)
GFR calc Af Amer: >60 mL/min (ref >60)
Glucose, Bld: 120 mg/dL — ABNORMAL HIGH (ref 65–99)
POTASSIUM: 4.9 mmol/L (ref 3.5–5.1)
SODIUM: 142 mmol/L (ref 135–145)

## 2015-07-24 LAB — ECHOCARDIOGRAM COMPLETE
Height: 73 in
Weight: 4860.8 oz

## 2015-07-24 LAB — CBC
HEMATOCRIT: 36.6 % — AB (ref 40.0–52.0)
Hemoglobin: 13.1 g/dL (ref 13.0–18.0)
MCH: 32.7 pg (ref 26.0–34.0)
MCHC: 35.8 g/dL (ref 32.0–36.0)
MCV: 91.2 fL (ref 80.0–100.0)
Platelets: 144 10*3/uL — ABNORMAL LOW (ref 150–440)
RBC: 4.01 MIL/uL — ABNORMAL LOW (ref 4.40–5.90)
RDW: 14.6 % — AB (ref 11.5–14.5)
WBC: 8.8 10*3/uL (ref 3.8–10.6)

## 2015-07-24 LAB — CK: CK TOTAL: 1607 U/L — AB (ref 49–397)

## 2015-07-24 LAB — TROPONIN I
TROPONIN I: 0.19 ng/mL — AB (ref <0.031)
Troponin I: 0.31 ng/mL — ABNORMAL HIGH (ref <0.031)
Troponin I: 0.26 ng/mL — ABNORMAL HIGH (ref <0.031)

## 2015-07-24 LAB — AMMONIA: Ammonia: 37 umol/L — ABNORMAL HIGH (ref 9–35)

## 2015-07-24 MED ORDER — ACETAMINOPHEN 650 MG RE SUPP: 650.0000 mg | Freq: Four times a day (QID) | RECTAL | Status: DC | PRN

## 2015-07-24 MED ORDER — SODIUM CHLORIDE 0.9 % IV SOLN
INTRAVENOUS | Status: DC
  Administered 2015-07-24 – 2015-07-25 (×3): via INTRAVENOUS

## 2015-07-24 MED ORDER — ACETAMINOPHEN 325 MG PO TABS
650.0000 mg | ORAL_TABLET | Freq: Four times a day (QID) | ORAL | Status: DC | PRN
  Administered 2015-07-26: 650 mg via ORAL
  Filled 2015-07-24: qty 2

## 2015-07-24 MED ORDER — SODIUM CHLORIDE 0.9% FLUSH
3.0000 mL | Freq: Two times a day (BID) | INTRAVENOUS | Status: DC
  Administered 2015-07-24 – 2015-07-26 (×5): 3 mL via INTRAVENOUS

## 2015-07-24 MED ORDER — ROPINIROLE HCL 1 MG PO TABS
2.0000 mg | ORAL_TABLET | Freq: Three times a day (TID) | ORAL | Status: DC
Start: 2015-07-24 — End: 2015-07-26
  Administered 2015-07-24 – 2015-07-25 (×5): 2 mg via ORAL
  Filled 2015-07-24 (×5): qty 2

## 2015-07-24 MED ORDER — HEPARIN SODIUM (PORCINE) 5000 UNIT/ML IJ SOLN
5000.0000 [IU] | Freq: Three times a day (TID) | INTRAMUSCULAR | Status: DC
  Administered 2015-07-24 – 2015-07-25 (×4): 5000 [IU] via SUBCUTANEOUS
  Filled 2015-07-24 (×4): qty 1

## 2015-07-24 MED ORDER — PANTOPRAZOLE SODIUM 40 MG PO TBEC
40.0000 mg | DELAYED_RELEASE_TABLET | Freq: Every day | ORAL | Status: DC
  Administered 2015-07-24 – 2015-07-26 (×3): 40 mg via ORAL
  Filled 2015-07-24 (×3): qty 1

## 2015-07-24 MED ORDER — LORAZEPAM 1 MG PO TABS
1.0000 mg | ORAL_TABLET | Freq: Every day | ORAL | Status: DC
  Administered 2015-07-24 – 2015-07-25 (×2): 1 mg via ORAL
  Filled 2015-07-24 (×3): qty 1

## 2015-07-24 MED ORDER — ISOSORBIDE MONONITRATE ER 60 MG PO TB24
60.0000 mg | ORAL_TABLET | Freq: Every day | ORAL | Status: DC
  Administered 2015-07-24 – 2015-07-26 (×3): 60 mg via ORAL
  Filled 2015-07-24 (×3): qty 1

## 2015-07-24 MED ORDER — ONDANSETRON HCL 4 MG PO TABS: 4.0000 mg | ORAL_TABLET | Freq: Four times a day (QID) | ORAL | Status: DC | PRN

## 2015-07-24 MED ORDER — CARVEDILOL 3.125 MG PO TABS: 3.1250 mg | ORAL_TABLET | Freq: Two times a day (BID) | ORAL | Status: DC

## 2015-07-24 MED ORDER — ONDANSETRON HCL 4 MG/2ML IJ SOLN: 4.0000 mg | Freq: Four times a day (QID) | INTRAMUSCULAR | Status: DC | PRN

## 2015-07-24 MED ORDER — SENNOSIDES-DOCUSATE SODIUM 8.6-50 MG PO TABS: 1.0000 | ORAL_TABLET | Freq: Every evening | ORAL | Status: DC | PRN

## 2015-07-24 MED ORDER — CARVEDILOL 3.125 MG PO TABS
3.1250 mg | ORAL_TABLET | Freq: Two times a day (BID) | ORAL | Status: DC
  Administered 2015-07-24 – 2015-07-26 (×5): 3.125 mg via ORAL
  Filled 2015-07-24 (×5): qty 1

## 2015-07-24 MED ORDER — LEVOTHYROXINE SODIUM 50 MCG PO TABS
25.0000 ug | ORAL_TABLET | Freq: Every day | ORAL | Status: DC
  Administered 2015-07-24 – 2015-07-26 (×3): 25 ug via ORAL
  Filled 2015-07-24 (×3): qty 1

## 2015-07-24 MED ORDER — CITALOPRAM HYDROBROMIDE 20 MG PO TABS
20.0000 mg | ORAL_TABLET | Freq: Every day | ORAL | Status: DC
  Administered 2015-07-24 – 2015-07-26 (×3): 20 mg via ORAL
  Filled 2015-07-24 (×3): qty 1

## 2015-07-24 MED ORDER — PANTOPRAZOLE SODIUM 40 MG PO TBEC: 40.0000 mg | DELAYED_RELEASE_TABLET | Freq: Every day | ORAL | Status: DC

## 2015-07-24 NOTE — H&P (Signed)
[facility] - [place] [facility]   PATIENT NAME: Jesse [name]    MR#:  [id number]  DATE OF BIRTH:  [birth year]  DATE OF ADMISSION:  07/23/2015  PRIMARY CARE PHYSICIAN: No PCP Per Patient   REQUESTING/REFERRING PHYSICIAN:   CHIEF COMPLAINT:   Chief Complaint  Patient presents with  . Fall  . Laceration    HISTORY OF PRESENT ILLNESS: Jesse [name]  is a 70 y.o. 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 Patient's wife went to telemetry on Thursday and patient fell down his bedroom and was lying on the floor for 3-4 hours. He was brought to the emergency room today. He had an abrasion over the face as well as extremities and the some blood stains were also noted on the walls at home secondary to fall. This information was given by patient's wife. Patient appeared little confused and altered according to the family member. Patient was worked up with a CT head and CT spine and CT maxillofacial area. No evidence of fracture is noted no evidence of any intracranial abnormality. During the workup in the emergency room first set of troponin was elevated. Case was discussed by ER physician with patient's cardiologist Dr. Welton Flakes who said that could be secondary to rhabdomyolysis. Patient has elevated CK level during the workup in the emergency room. Patient is somewhat lethargic and symptoms back to sleep. Not able to give much history.  PAST MEDICAL HISTORY:   Past Medical History  Diagnosis Date  . Hypertension   . Diabetes mellitus without complication (HCC)   . CHF (congestive heart failure) (HCC)   . CAD (coronary artery disease)   . A-fib (HCC)   . Sleep apnea   . Chronic edema     PAST SURGICAL  HISTORY: Past Surgical History  Procedure Laterality Date  . Pacemaker insertion      SOCIAL HISTORY:  Social History  Substance Use Topics  . Smoking status: Former Games developer  . Smokeless tobacco: Not on file  . Alcohol Use: 0.0 oz/week    0 Standard drinks or equivalent per week    FAMILY HISTORY:  Family History  Problem Relation Age of Onset  . Diabetes Brother     DRUG ALLERGIES:  Allergies  Allergen Reactions  . Sulfa Antibiotics     REVIEW OF SYSTEMS:  Unable to obtain as patient is sleepy and lethargic.  MEDICATIONS AT HOME:  Prior to Admission medications   Medication Sig Start Date End Date Taking? Authorizing Provider  bumetanide (BUMEX) 1 MG tablet Take 3 tablets by mouth daily. Pt takes 2 tablets in the AM and 1 tablet in the afternoon as directed 06/26/15  Yes Historical Provider, MD  carvedilol (COREG) 3.125 MG tablet Take 1 tablet by mouth 2 (two) times daily. 05/17/15  Yes Historical Provider, MD  citalopram (CELEXA) 20 MG tablet Take 20 mg by mouth daily. 06/07/15  Yes Historical Provider, MD  gabapentin (NEURONTIN) 400 MG capsule Take 1 capsule by mouth 3 (three) times daily. 06/26/15  Yes Historical Provider, MD  isosorbide mononitrate (IMDUR) 60 MG 24 hr tablet Take 1 tablet by mouth daily. 05/26/15  Yes Historical Provider, MD  levothyroxine (SYNTHROID, LEVOTHROID) 25 MCG tablet Take 25 mcg by mouth daily. 05/26/15  Yes Historical Provider, MD  LORazepam (ATIVAN) 1 MG tablet Take 1 mg by mouth 2 (two) times daily. 04/18/15  Yes Historical Provider, MD  pantoprazole (PROTONIX) 40 MG tablet Take 40 mg by mouth daily. 05/02/15  Yes Historical Provider, MD  rOPINIRole (REQUIP) 2 MG tablet Take 1 tablet by mouth 3 (three) times daily. 06/11/15  Yes Historical Provider, MD  spironolactone (ALDACTONE) 25 MG tablet Take 25 mg by mouth daily. 05/25/15  Yes Historical Provider, MD      PHYSICAL EXAMINATION:   VITAL SIGNS: Blood pressure 129/75, pulse 79, temperature 97.8 F  (36.6 C), temperature source Axillary, resp. rate 17, SpO2 97 %.  GENERAL:  70 y.o.-year-old patient lying in the bed with no acute distress.  EYES: Pupils equal, round, reactive to light and accommodation. No scleral icterus. Extraocular muscles intact.  HEENT: Head normocephalic. Oropharynx and nasopharynx clear. Has abrasion over the frontal area and nose NECK:  Supple, no jugular venous distention. No thyroid enlargement, no tenderness.  LUNGS: Normal breath sounds bilaterally, no wheezing, rales,rhonchi or crepitation. No use of accessory muscles of respiration.  CARDIOVASCULAR: S1, S2 normal. No murmurs, rubs, or gallops.  ABDOMEN: Soft, nontender, nondistended. Bowel sounds present. No organomegaly or mass.  EXTREMITIES: No pedal edema, cyanosis, or clubbing. Has abrasion secondary to fall NEUROLOGIC: Awake and arousable, not completely oriented to time,place and person. PSYCHIATRIC: could not be assessed SKIN: Has abrasion over the arm ad face areas.  LABORATORY PANEL:   CBC  Recent Labs Lab 07/23/15 1948  WBC 10.3  HGB 14.3  HCT 41.7  PLT 158  MCV 92.8  MCH 31.9  MCHC 34.4  RDW 14.6*  LYMPHSABS 0.5*  MONOABS 0.9  EOSABS 0.1  BASOSABS 0.0   ------------------------------------------------------------------------------------------------------------------  Chemistries   Recent Labs Lab 07/23/15 1948  NA 140  K 3.9  CL 104  CO2 30  GLUCOSE 119*  BUN 32*  CREATININE 1.01  CALCIUM 8.7*  AST 78*  ALT 28  ALKPHOS 67  BILITOT 2.2*   ------------------------------------------------------------------------------------------------------------------ CrCl cannot be calculated (Unknown ideal weight.). ------------------------------------------------------------------------------------------------------------------ No results for input(s): TSH, T4TOTAL, T3FREE, THYROIDAB in the last 72 hours.  Invalid input(s): FREET3   Coagulation profile No results for  input(s): INR, PROTIME in the last 168 hours. ------------------------------------------------------------------------------------------------------------------- No results for input(s): DDIMER in the last 72 hours. -------------------------------------------------------------------------------------------------------------------  Cardiac Enzymes  Recent Labs Lab 07/23/15 1948  TROPONINI 0.34*   ------------------------------------------------------------------------------------------------------------------ Invalid input(s): POCBNP  ---------------------------------------------------------------------------------------------------------------  Urinalysis    Component Value Date/Time   COLORURINE YELLOW* 07/23/2015 1948   APPEARANCEUR CLEAR* 07/23/2015 1948   LABSPEC 1.017 07/23/2015 1948   PHURINE 5.0 07/23/2015 1948   GLUCOSEU NEGATIVE 07/23/2015 1948   HGBUR NEGATIVE 07/23/2015 1948   BILIRUBINUR NEGATIVE 07/23/2015 1948   KETONESUR 1+* 07/23/2015 1948   PROTEINUR NEGATIVE 07/23/2015 1948   NITRITE NEGATIVE 07/23/2015 1948   LEUKOCYTESUR NEGATIVE 07/23/2015 1948     RADIOLOGY: Dg Chest 2 View  07/23/2015  CLINICAL DATA:  Found on floor at home. EXAM: CHEST  2 VIEW COMPARISON:  None FINDINGS: There is moderate cardiomegaly. There are grossly intact appearances of the visible portions of the transvenous cardiac lead. The lungs are clear. The pulmonary vasculature is normal. There is no pleural effusion. Hilar and mediastinal contours are unremarkable. IMPRESSION: Cardiomegaly.  No acute cardiopulmonary findings. Electronically Signed   By: Ellery Plunk M.D.   On: 07/23/2015 21:31   Dg Pelvis 1-2 Views  07/23/2015  CLINICAL DATA:  Found on floor at home  EXAM: PELVIS - 1-2 VIEW COMPARISON:  None FINDINGS: Negative for acute fracture dislocation. Sacroiliac joints and pubic symphysis appear intact. No soft tissue foreign body. IMPRESSION: Negative. Electronically Signed    By: Ellery Plunk M.D.   On: 07/23/2015 21:31   Ct Head Wo Contrast  07/23/2015  CLINICAL DATA:  Patient found unresponsive on the floor with blood on his face. EXAM: CT HEAD WITHOUT CONTRAST CT MAXILLOFACIAL WITHOUT CONTRAST CT CERVICAL SPINE WITHOUT CONTRAST TECHNIQUE: Multidetector CT imaging of the head, cervical spine, and maxillofacial structures were performed using the standard protocol without intravenous contrast. Multiplanar CT image reconstructions of the cervical spine and maxillofacial structures were also generated. COMPARISON:  None. FINDINGS: CT HEAD FINDINGS Ventricles and sulci are appropriate for patient's age. 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 No evidence for acute intracranial hemorrhage, mass lesion or mass-effect. Orbits are unremarkable. Scalp soft tissues are unremarkable. Mucosal thickening involving the left maxillary sinus. Mastoid air cells unremarkable. Calvarium is intact. CT MAXILLOFACIAL FINDINGS Mucosal thickening involving the left maxillary sinus. Zygomatic archs and pterygoid plates are intact. Maxilla and mandible are intact. Multiple missing teeth are demonstrated. Multiple dental caries. No evidence for acute maxillofacial fracture. CT CERVICAL SPINE FINDINGS Straightening of the normal cervical lordosis. Multilevel degenerative disc disease and facet disease. Preservation of the vertebral body heights. Craniocervical junction is intact. No evidence for acute cervical spine fracture. In the right upper lobe there is a 8 x 7 mm nodule (8 mm mean diameter) on image 94 of series 13. Prevertebral soft tissues unremarkable. IMPRESSION: No acute intracranial process. No acute cervical spine fracture. No acute maxillofacial fracture. Mucosal thickening left maxillary sinus. There is an 8 mm right upper lobe pulmonary nodule. Non-contrast chest CT at 6-12 months is recommended. If the nodule is stable at time of repeat CT, then future CT at 18-24 months  (from today's scan) is considered optional for low-risk patients, but is recommended for high-risk patients. This recommendation follows the consensus statement: Guidelines for Management of Incidental Pulmonary Nodules Detected on CT Images:From the Fleischner Society 2017; published online before print (10.1148/[id number]). Electronically Signed   By: Annia Belt M.D.   On: 07/23/2015 22:08   Ct Cervical Spine Wo Contrast  07/23/2015  CLINICAL DATA:  Patient found unresponsive on the floor with blood on his face. EXAM: CT HEAD WITHOUT CONTRAST CT MAXILLOFACIAL WITHOUT CONTRAST CT CERVICAL SPINE WITHOUT CONTRAST TECHNIQUE: Multidetector CT imaging of the head, cervical spine, and maxillofacial structures were performed using the standard protocol without intravenous contrast. Multiplanar CT image reconstructions of the cervical spine and maxillofacial structures were also generated. COMPARISON:  None. FINDINGS: CT HEAD FINDINGS Ventricles and sulci are appropriate for patient's age. 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 No evidence for acute intracranial hemorrhage, mass lesion or mass-effect. Orbits are unremarkable. Scalp soft tissues are unremarkable. Mucosal thickening involving the left maxillary sinus. Mastoid air cells unremarkable. Calvarium is intact. CT MAXILLOFACIAL FINDINGS Mucosal thickening involving the left maxillary sinus. Zygomatic archs and pterygoid plates are intact. Maxilla and mandible are intact. Multiple missing teeth are demonstrated. Multiple dental caries. No evidence for acute maxillofacial fracture. CT CERVICAL SPINE FINDINGS Straightening of the normal cervical lordosis. Multilevel degenerative disc disease and facet disease. Preservation of the vertebral body heights. Craniocervical junction is intact. No evidence for acute cervical spine fracture. In the right upper lobe there is a 8 x 7 mm nodule (8 mm mean diameter) on image 94 of series 13.  Prevertebral soft tissues unremarkable. IMPRESSION: No acute intracranial process. No acute cervical spine fracture. No acute maxillofacial fracture. Mucosal thickening left maxillary sinus. There is an 8 mm right upper lobe pulmonary nodule. Non-contrast chest CT at 6-12 months is recommended. If the nodule is stable at time of repeat CT, then future CT at 18-24 months (from today's scan) is considered optional for low-risk patients, but is recommended for high-risk patients. This recommendation follows the consensus statement: Guidelines for Management of Incidental Pulmonary Nodules Detected on CT Images:From the Fleischner Society 2017; published online before print (10.1148/[phone]). Electronically Signed   By: Annia Beltrew  Davis M.D.   On: 07/23/2015 22:08   Ct Maxillofacial Wo Cm  07/23/2015  CLINICAL DATA:  Patient found unresponsive on the floor with blood on his face. EXAM: CT HEAD WITHOUT CONTRAST CT MAXILLOFACIAL WITHOUT CONTRAST CT CERVICAL SPINE WITHOUT CONTRAST TECHNIQUE: Multidetector CT imaging of the head, cervical spine, and maxillofacial structures were performed using the standard protocol without intravenous contrast. Multiplanar CT image reconstructions of the cervical spine and maxillofacial structures were also generated. COMPARISON:  None. FINDINGS: CT HEAD FINDINGS Ventricles and sulci are appropriate for patient's age. 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 No evidence for acute intracranial hemorrhage, mass lesion or mass-effect. Orbits are unremarkable. Scalp soft tissues are unremarkable. Mucosal thickening involving the left maxillary sinus. Mastoid air cells unremarkable. Calvarium is intact. CT MAXILLOFACIAL FINDINGS Mucosal thickening involving the left maxillary sinus. Zygomatic archs and pterygoid plates are intact. Maxilla and mandible are intact. Multiple missing teeth are demonstrated. Multiple dental caries. No evidence for acute maxillofacial  fracture. CT CERVICAL SPINE FINDINGS Straightening of the normal cervical lordosis. Multilevel degenerative disc disease and facet disease. Preservation of the vertebral body heights. Craniocervical junction is intact. No evidence for acute cervical spine fracture. In the right upper lobe there is a 8 x 7 mm nodule (8 mm mean diameter) on image 94 of series 13. Prevertebral soft tissues unremarkable. IMPRESSION: No acute intracranial process. No acute cervical spine fracture. No acute maxillofacial fracture. Mucosal thickening left maxillary sinus. There is an 8 mm right upper lobe pulmonary nodule. Non-contrast chest CT at 6-12 months is recommended. If the nodule is stable at time of repeat CT, then future CT at 18-24 months (from today's scan) is considered optional for low-risk patients, but is recommended for high-risk patients. This recommendation follows the consensus statement: Guidelines for Management of Incidental Pulmonary Nodules Detected on CT Images:From the Fleischner Society 2017; published online before print (10.1148/[phone]). Electronically Signed   By: Annia Beltrew  Davis M.D.   On: 07/23/2015 22:08    EKG: Orders placed or performed during the hospital encounter of 07/23/15  . ED EKG  . ED EKG    IMPRESSION AND PLAN: 70 year old male patient with history of hypertension, diabetes mellitus, congestive heart failure, pacemaker, sleep apnea, coronary artery disease presented to the emergency room secondary to fall and confusion.  Admitting diagnosis 1. Accidental fall 2. Questionable syncope 3. Rhabdomyolysis 4. Dehydration 5. Abnormal troponin 6. Hypertension 7. Congestive heart failure 8. Altered mental status Treatment plan Admit patient to telemetry Continue CPAP at bedtime IV fluid hydration Hold diuretics Cycle troponin check for ischemia Elevated troponin could be secondary to rhabdomyolysis DVT prophylaxis subcutaneous heparin Follow-up CK level Monitor  patient on telemetry for any arrhythmia. Cardiac consultation Supportive care.  All the records are reviewed and case discussed with ED provider. Management plans discussed with the patient, family and they are in agreement.  CODE  STATUS:FULL Code Status History    This patient does not have a recorded code status. Please follow your organizational policy for patients in this situation.       TOTAL TIME TAKING CARE OF THIS PATIENT: 52 minutes.    Ihor Austin M.D on 07/24/2015 at 12:53 AM  Between 7am to 6pm - Pager - [phone]  After 6pm go to [email or web address] - password [email or web address] [facility]  [place] [facility]  Office  [phone]  CC: Primary care physician; No PCP Per Patient

## 2015-07-24 NOTE — Progress Notes (Signed)
*  PRELIMINARY RESULTS* Echocardiogram 2D Echocardiogram has been performed.  Garrel Ridgelikeshia S Stills 07/24/2015, 2:09 PM

## 2015-07-24 NOTE — ED Notes (Signed)
MD at bedside. 

## 2015-07-24 NOTE — Care Management Important Message (Signed)
Important Message  Patient Details  Name: Jesse PerchesSamuel [name] MRN: [id number] Date of Birth: 02/20/1946   Medicare Important Message Given:  Yes    Harris Kistler A, RN 07/24/2015, 3:17 PM

## 2015-07-24 NOTE — Progress Notes (Signed)
Jesse [name] is a 70 y.o. male  [id number]  Primary Cardiologist: Adrian Blackwater Reason for Consultation: Syncope with elevated troponin  HPI: 70 year old white male with a past medical history of coronary artery disease diastolic heart failure pacemaker implantation and atrial fibrillation presented to the hospital after having an episode of syncope and abrasion on the face. Patient denied any chest pain but has been short of breath and had several syncopal episodes in the past which ended up leading to pacemaker implantation. There was gap of several weeks after implantation of the pacemaker that patient did not have syncope. Now had another episode where he fell down and was on the floor for like up few days.   Review of Systems: No chest pain no fever no chills   Past Medical History  Diagnosis Date  . Hypertension   . Diabetes mellitus without complication (HCC)   . CHF (congestive heart failure) (HCC)   . CAD (coronary artery disease)   . A-fib (HCC)   . Sleep apnea   . Chronic edema     Medications Prior to Admission  Medication Sig Dispense Refill  . bumetanide (BUMEX) 1 MG tablet Take 3 tablets by mouth daily. Pt takes 2 tablets in the AM and 1 tablet in the afternoon as directed  0  . carvedilol (COREG) 3.125 MG tablet Take 1 tablet by mouth 2 (two) times daily.  3  . citalopram (CELEXA) 20 MG tablet Take 20 mg by mouth daily.  0  . gabapentin (NEURONTIN) 400 MG capsule Take 1 capsule by mouth 3 (three) times daily.  0  . isosorbide mononitrate (IMDUR) 60 MG 24 hr tablet Take 1 tablet by mouth daily.  0  . levothyroxine (SYNTHROID, LEVOTHROID) 25 MCG tablet Take 25 mcg by mouth daily.  0  . LORazepam (ATIVAN) 1 MG tablet Take 1 mg by mouth 2 (two) times daily.  0  . pantoprazole (PROTONIX) 40 MG tablet Take 40 mg by mouth daily.  0  . rOPINIRole (REQUIP) 2 MG tablet Take 1 tablet by mouth 3 (three) times daily.  0  . spironolactone (ALDACTONE) 25 MG tablet Take 25 mg by  mouth daily.  0     . carvedilol  3.125 mg Oral BID WC  . citalopram  20 mg Oral Daily  . heparin  5,000 Units Subcutaneous 3 times per day  . isosorbide mononitrate  60 mg Oral Daily  . levothyroxine  25 mcg Oral QAC breakfast  . pantoprazole  40 mg Oral QAC breakfast  . rOPINIRole  2 mg Oral TID  . sodium chloride flush  3 mL Intravenous Q12H    Infusions: . sodium chloride 100 mL/hr at 07/24/15 0237    Allergies  Allergen Reactions  . Sulfa Antibiotics     Social History   Social History  . Marital Status: Married    Spouse Name: N/A  . Number of Children: N/A  . Years of Education: N/A   Occupational History  . retired    Social History Main Topics  . Smoking status: Former Games developer  . Smokeless tobacco: Not on file  . Alcohol Use: 0.0 oz/week    0 Standard drinks or equivalent per week  . Drug Use: No  . Sexual Activity: Not on file   Other Topics Concern  . Not on file   Social History Narrative    Family History  Problem Relation Age of Onset  . Diabetes Brother     PHYSICAL EXAM:  Filed Vitals:   07/24/15 0130 07/24/15 0256  BP: 138/85 118/64  Pulse: 85 75  Temp:  98.1 F (36.7 C)  Resp: 19 24    No intake or output data in the 24 hours ending 07/24/15 1057  General:  Well appearing. No respiratory difficulty HEENT: normal Neck: supple. no JVD. Carotids 2+ bilat; no bruits. No lymphadenopathy or thryomegaly appreciated. Cor: PMI nondisplaced. Regular rate & rhythm. No rubs, gallops or murmurs. Lungs: clear Abdomen: soft, nontender, nondistended. No hepatosplenomegaly. No bruits or masses. Good bowel sounds. Extremities: no cyanosis, clubbing, rash, edema Neuro: alert & oriented x 3, cranial nerves grossly intact. moves all 4 extremities w/o difficulty. Affect pleasant.  ECG: Unable to view EKG  Results for orders placed or performed during the hospital encounter of 07/23/15 (from the past 24 hour(s))  CBC with Differential     Status:  Abnormal   Collection Time: 07/23/15  7:48 PM  Result Value Ref Range   WBC 10.3 3.8 - 10.6 K/uL   RBC 4.49 4.40 - 5.90 MIL/uL   Hemoglobin 14.3 13.0 - 18.0 g/dL   HCT 16.141.7 09.640.0 - 04.552.0 %   MCV 92.8 80.0 - 100.0 fL   MCH 31.9 26.0 - 34.0 pg   MCHC 34.4 32.0 - 36.0 g/dL   RDW 40.914.6 (H) 81.111.5 - 91.414.5 %   Platelets 158 150 - 440 K/uL   Neutrophils Relative % 84 %   Neutro Abs 8.7 (H) 1.4 - 6.5 K/uL   Lymphocytes Relative 5 %   Lymphs Abs 0.5 (L) 1.0 - 3.6 K/uL   Monocytes Relative 9 %   Monocytes Absolute 0.9 0.2 - 1.0 K/uL   Eosinophils Relative 1 %   Eosinophils Absolute 0.1 0 - 0.7 K/uL   Basophils Relative 1 %   Basophils Absolute 0.0 0 - 0.1 K/uL  Type and screen [facility]     Status: None   Collection Time: 07/23/15  7:48 PM  Result Value Ref Range   ABO/RH(D) A POS    Antibody Screen NEG    Sample Expiration 07/26/2015   Troponin I     Status: Abnormal   Collection Time: 07/23/15  7:48 PM  Result Value Ref Range   Troponin I 0.34 (H) <0.031 ng/mL  Comprehensive metabolic panel     Status: Abnormal   Collection Time: 07/23/15  7:48 PM  Result Value Ref Range   Sodium 140 135 - 145 mmol/L   Potassium 3.9 3.5 - 5.1 mmol/L   Chloride 104 101 - 111 mmol/L   CO2 30 22 - 32 mmol/L   Glucose, Bld 119 (H) 65 - 99 mg/dL   BUN 32 (H) 6 - 20 mg/dL   Creatinine, Ser 7.821.01 0.61 - 1.24 mg/dL   Calcium 8.7 (L) 8.9 - 10.3 mg/dL   Total Protein 7.6 6.5 - 8.1 g/dL   Albumin 4.4 3.5 - 5.0 g/dL   AST 78 (H) 15 - 41 U/L   ALT 28 17 - 63 U/L   Alkaline Phosphatase 67 38 - 126 U/L   Total Bilirubin 2.2 (H) 0.3 - 1.2 mg/dL   GFR calc non Af Amer >60 >60 mL/min   GFR calc Af Amer >60 >60 mL/min   Anion gap 6 5 - 15  Lipase, blood     Status: None   Collection Time: 07/23/15  7:48 PM  Result Value Ref Range   Lipase 14 11 - 51 U/L  Ethanol     Status:  None   Collection Time: 07/23/15  7:48 PM  Result Value Ref Range   Alcohol, Ethyl (B) <5 <5 mg/dL  Urine Drug  Screen, Qualitative ([facility] only)     Status: Abnormal   Collection Time: 07/23/15  7:48 PM  Result Value Ref Range   Tricyclic, Ur Screen NONE DETECTED NONE DETECTED   Amphetamines, Ur Screen NONE DETECTED NONE DETECTED   MDMA (Ecstasy)Ur Screen NONE DETECTED NONE DETECTED   Cocaine Metabolite,Ur [place] NONE DETECTED NONE DETECTED   Opiate, Ur Screen NONE DETECTED NONE DETECTED   Phencyclidine (PCP) Ur S NONE DETECTED NONE DETECTED   Cannabinoid 50 Ng, Ur [place] NONE DETECTED NONE DETECTED   Barbiturates, Ur Screen NONE DETECTED NONE DETECTED   Benzodiazepine, Ur Scrn POSITIVE (A) NONE DETECTED   Methadone Scn, Ur NONE DETECTED NONE DETECTED  Urinalysis complete, with microscopic ([facility] only)     Status: Abnormal   Collection Time: 07/23/15  7:48 PM  Result Value Ref Range   Color, Urine YELLOW (A) YELLOW   APPearance CLEAR (A) CLEAR   Glucose, UA NEGATIVE NEGATIVE mg/dL   Bilirubin Urine NEGATIVE NEGATIVE   Ketones, ur 1+ (A) NEGATIVE mg/dL   Specific Gravity, Urine 1.017 1.005 - 1.030   Hgb urine dipstick NEGATIVE NEGATIVE   pH 5.0 5.0 - 8.0   Protein, ur NEGATIVE NEGATIVE mg/dL   Nitrite NEGATIVE NEGATIVE   Leukocytes, UA NEGATIVE NEGATIVE   RBC / HPF 0-5 0 - 5 RBC/hpf   WBC, UA 0-5 0 - 5 WBC/hpf   Bacteria, UA RARE (A) NONE SEEN   Squamous Epithelial / LPF 0-5 (A) NONE SEEN   Mucous PRESENT    Hyaline Casts, UA PRESENT   CK     Status: Abnormal   Collection Time: 07/23/15  7:48 PM  Result Value Ref Range   Total CK 2139 (H) 49 - 397 U/L  ABO/Rh     Status: None   Collection Time: 07/23/15  7:48 PM  Result Value Ref Range   ABO/RH(D) A POS   Glucose, capillary     Status: None   Collection Time: 07/23/15  8:10 PM  Result Value Ref Range   Glucose-Capillary 90 65 - 99 mg/dL  CK     Status: Abnormal   Collection Time: 07/24/15  3:13 AM  Result Value Ref Range   Total CK 1607 (H) 49 - 397 U/L  Basic metabolic panel     Status: Abnormal   Collection Time: 07/24/15  3:13 AM   Result Value Ref Range   Sodium 142 135 - 145 mmol/L   Potassium 4.9 3.5 - 5.1 mmol/L   Chloride 111 101 - 111 mmol/L   CO2 22 22 - 32 mmol/L   Glucose, Bld 120 (H) 65 - 99 mg/dL   BUN 30 (H) 6 - 20 mg/dL   Creatinine, Ser 5.78 0.61 - 1.24 mg/dL   Calcium 8.5 (L) 8.9 - 10.3 mg/dL   GFR calc non Af Amer >60 >60 mL/min   GFR calc Af Amer >60 >60 mL/min   Anion gap 9 5 - 15  CBC     Status: Abnormal   Collection Time: 07/24/15  3:13 AM  Result Value Ref Range   WBC 8.8 3.8 - 10.6 K/uL   RBC 4.01 (L) 4.40 - 5.90 MIL/uL   Hemoglobin 13.1 13.0 - 18.0 g/dL   HCT 46.9 (L) 62.9 - 52.8 %   MCV 91.2 80.0 - 100.0 fL   MCH 32.7 26.0 -  34.0 pg   MCHC 35.8 32.0 - 36.0 g/dL   RDW 81.1 (H) 91.4 - 78.2 %   Platelets 144 (L) 150 - 440 K/uL  Troponin I     Status: Abnormal   Collection Time: 07/24/15  3:40 AM  Result Value Ref Range   Troponin I 0.31 (H) <0.031 ng/mL  Troponin I     Status: Abnormal   Collection Time: 07/24/15  7:54 AM  Result Value Ref Range   Troponin I 0.26 (H) <0.031 ng/mL  Blood gas, arterial     Status: Abnormal   Collection Time: 07/24/15  9:52 AM  Result Value Ref Range   FIO2 0.28    Delivery systems CONTINUOUS POSITIVE AIRWAY PRESSURE    Expiratory PAP 15.6    pH, Arterial 7.41 7.350 - 7.450   pCO2 arterial 45 32.0 - 48.0 mmHg   pO2, Arterial 50 (L) 83.0 - 108.0 mmHg   Bicarbonate 28.5 (H) 21.0 - 28.0 mEq/L   Acid-Base Excess 3.3 (H) 0.0 - 3.0 mmol/L   O2 Saturation 85.4 %   Patient temperature 37.0    Collection site LEFT BRACHIAL    Sample type ARTERIAL DRAW    Allens test (pass/fail) POSITIVE (A) PASS   Dg Chest 2 View  07/23/2015  CLINICAL DATA:  Found on floor at home. EXAM: CHEST  2 VIEW COMPARISON:  None FINDINGS: There is moderate cardiomegaly. There are grossly intact appearances of the visible portions of the transvenous cardiac lead. The lungs are clear. The pulmonary vasculature is normal. There is no pleural effusion. Hilar and mediastinal  contours are unremarkable. IMPRESSION: Cardiomegaly.  No acute cardiopulmonary findings. Electronically Signed   By: Ellery Plunk M.D.   On: 07/23/2015 21:31   Dg Pelvis 1-2 Views  07/23/2015  CLINICAL DATA:  Found on floor at home EXAM: PELVIS - 1-2 VIEW COMPARISON:  None FINDINGS: Negative for acute fracture dislocation. Sacroiliac joints and pubic symphysis appear intact. No soft tissue foreign body. IMPRESSION: Negative. Electronically Signed   By: Ellery Plunk M.D.   On: 07/23/2015 21:31   Ct Head Wo Contrast  07/23/2015  CLINICAL DATA:  Patient found unresponsive on the floor with blood on his face. EXAM: CT HEAD WITHOUT CONTRAST CT MAXILLOFACIAL WITHOUT CONTRAST CT CERVICAL SPINE WITHOUT CONTRAST TECHNIQUE: Multidetector CT imaging of the head, cervical spine, and maxillofacial structures were performed using the standard protocol without intravenous contrast. Multiplanar CT image reconstructions of the cervical spine and maxillofacial structures were also generated. COMPARISON:  None. FINDINGS: CT HEAD FINDINGS Ventricles and sulci are appropriate for patient's age. 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 No evidence for acute intracranial hemorrhage, mass lesion or mass-effect. Orbits are unremarkable. Scalp soft tissues are unremarkable. Mucosal thickening involving the left maxillary sinus. Mastoid air cells unremarkable. Calvarium is intact. CT MAXILLOFACIAL FINDINGS Mucosal thickening involving the left maxillary sinus. Zygomatic archs and pterygoid plates are intact. Maxilla and mandible are intact. Multiple missing teeth are demonstrated. Multiple dental caries. No evidence for acute maxillofacial fracture. CT CERVICAL SPINE FINDINGS Straightening of the normal cervical lordosis. Multilevel degenerative disc disease and facet disease. Preservation of the vertebral body heights. Craniocervical junction is intact. No evidence for acute cervical spine fracture. In the  right upper lobe there is a 8 x 7 mm nodule (8 mm mean diameter) on image 94 of series 13. Prevertebral soft tissues unremarkable. IMPRESSION: No acute intracranial process. No acute cervical spine fracture. No acute maxillofacial fracture. Mucosal thickening left maxillary  sinus. There is an 8 mm right upper lobe pulmonary nodule. Non-contrast chest CT at 6-12 months is recommended. If the nodule is stable at time of repeat CT, then future CT at 18-24 months (from today's scan) is considered optional for low-risk patients, but is recommended for high-risk patients. This recommendation follows the consensus statement: Guidelines for Management of Incidental Pulmonary Nodules Detected on CT Images:From the Fleischner Society 2017; published online before print (10.1148/[id number]). Electronically Signed   By: Annia Belt M.D.   On: 07/23/2015 22:08   Ct Cervical Spine Wo Contrast  07/23/2015  CLINICAL DATA:  Patient found unresponsive on the floor with blood on his face. EXAM: CT HEAD WITHOUT CONTRAST CT MAXILLOFACIAL WITHOUT CONTRAST CT CERVICAL SPINE WITHOUT CONTRAST TECHNIQUE: Multidetector CT imaging of the head, cervical spine, and maxillofacial structures were performed using the standard protocol without intravenous contrast. Multiplanar CT image reconstructions of the cervical spine and maxillofacial structures were also generated. COMPARISON:  None. FINDINGS: CT HEAD FINDINGS Ventricles and sulci are appropriate for patient's age. 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 No evidence for acute intracranial hemorrhage, mass lesion or mass-effect. Orbits are unremarkable. Scalp soft tissues are unremarkable. Mucosal thickening involving the left maxillary sinus. Mastoid air cells unremarkable. Calvarium is intact. CT MAXILLOFACIAL FINDINGS Mucosal thickening involving the left maxillary sinus. Zygomatic archs and pterygoid plates are intact. Maxilla and mandible are intact. Multiple  missing teeth are demonstrated. Multiple dental caries. No evidence for acute maxillofacial fracture. CT CERVICAL SPINE FINDINGS Straightening of the normal cervical lordosis. Multilevel degenerative disc disease and facet disease. Preservation of the vertebral body heights. Craniocervical junction is intact. No evidence for acute cervical spine fracture. In the right upper lobe there is a 8 x 7 mm nodule (8 mm mean diameter) on image 94 of series 13. Prevertebral soft tissues unremarkable. IMPRESSION: No acute intracranial process. No acute cervical spine fracture. No acute maxillofacial fracture. Mucosal thickening left maxillary sinus. There is an 8 mm right upper lobe pulmonary nodule. Non-contrast chest CT at 6-12 months is recommended. If the nodule is stable at time of repeat CT, then future CT at 18-24 months (from today's scan) is considered optional for low-risk patients, but is recommended for high-risk patients. This recommendation follows the consensus statement: Guidelines for Management of Incidental Pulmonary Nodules Detected on CT Images:From the Fleischner Society 2017; published online before print (10.1148/[id number]). Electronically Signed   By: Annia Belt M.D.   On: 07/23/2015 22:08   Ct Maxillofacial Wo Cm  07/23/2015  CLINICAL DATA:  Patient found unresponsive on the floor with blood on his face. EXAM: CT HEAD WITHOUT CONTRAST CT MAXILLOFACIAL WITHOUT CONTRAST CT CERVICAL SPINE WITHOUT CONTRAST TECHNIQUE: Multidetector CT imaging of the head, cervical spine, and maxillofacial structures were performed using the standard protocol without intravenous contrast. Multiplanar CT image reconstructions of the cervical spine and maxillofacial structures were also generated. COMPARISON:  None. FINDINGS: CT HEAD FINDINGS Ventricles and sulci are appropriate for patient's age. 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 No evidence for acute intracranial hemorrhage, mass lesion or  mass-effect. Orbits are unremarkable. Scalp soft tissues are unremarkable. Mucosal thickening involving the left maxillary sinus. Mastoid air cells unremarkable. Calvarium is intact. CT MAXILLOFACIAL FINDINGS Mucosal thickening involving the left maxillary sinus. Zygomatic archs and pterygoid plates are intact. Maxilla and mandible are intact. Multiple missing teeth are demonstrated. Multiple dental caries. No evidence for acute maxillofacial fracture. CT CERVICAL SPINE FINDINGS Straightening of the normal cervical lordosis.  Multilevel degenerative disc disease and facet disease. Preservation of the vertebral body heights. Craniocervical junction is intact. No evidence for acute cervical spine fracture. In the right upper lobe there is a 8 x 7 mm nodule (8 mm mean diameter) on image 94 of series 13. Prevertebral soft tissues unremarkable. IMPRESSION: No acute intracranial process. No acute cervical spine fracture. No acute maxillofacial fracture. Mucosal thickening left maxillary sinus. There is an 8 mm right upper lobe pulmonary nodule. Non-contrast chest CT at 6-12 months is recommended. If the nodule is stable at time of repeat CT, then future CT at 18-24 months (from today's scan) is considered optional for low-risk patients, but is recommended for high-risk patients. This recommendation follows the consensus statement: Guidelines for Management of Incidental Pulmonary Nodules Detected on CT Images:From the Fleischner Society 2017; published online before print (10.1148/[id number]). Electronically Signed   By: Annia Belt M.D.   On: 07/23/2015 22:08     ASSESSMENT AND PLAN: Elevated troponin with syncope initially thought to be rhabdomyolysis and patient has been given IV fluids and is being watched. Advise getting an echocardiogram to look at wall motion and ejection fraction and consider doing cardiac catheterization as troponin is quite high to be only due to rhabdomyolysis. Given the patient has  syncopal episode we will consider doing cardiac catheterization.  Jesse [name] A

## 2015-07-24 NOTE — Progress Notes (Signed)
Pt admitted to room 236. A&Ox4, drowsy, VSS, no complaints at this time. Pt educated on call bell, telephone, bed alarm, and need to call nursing before up out of bed. Skin assessed with Yasmin, RN. Tele[place], [place]. RN will continue to monitor and treat per MD orders. Syliva Overmanassie A Soundra Lampley, RN

## 2015-07-24 NOTE — Progress Notes (Signed)
[employer] - [place] [facility]   PATIENT NAME: Jesse [name]    MR#:  [id number]  DATE OF BIRTH:  12-15-1945  SUBJECTIVE:   Patient here due to recurrent falls, generalized weakness and also noted to be in acute rhabdomyolysis. Patient is quite lethargic this morning, wife is at bedside. Patient on CPAP presently but responsive and follows simple commands.   REVIEW OF SYSTEMS:    Review of Systems  Constitutional: Negative for fever and chills.  HENT: Negative for congestion and tinnitus.   Eyes: Negative for blurred vision and double vision.  Respiratory: Negative for cough, shortness of breath and wheezing.   Cardiovascular: Positive for leg swelling. Negative for chest pain, orthopnea and PND.  Gastrointestinal: Negative for nausea, vomiting, abdominal pain and diarrhea.  Genitourinary: Negative for dysuria and hematuria.  Musculoskeletal: Positive for falls.  Neurological: Positive for weakness. Negative for dizziness, sensory change and focal weakness.  All other systems reviewed and are negative.   Nutrition: Heart healthy Tolerating Diet: Yes Tolerating PT: Await evaluation   DRUG ALLERGIES:   Allergies  Allergen Reactions  . Sulfa Antibiotics     VITALS:  Blood pressure 122/61, pulse 69, temperature 98.7 F (37.1 C), temperature source Oral, resp. rate 19, height  (1.854 m), weight 137.803 kg (303 lb 12.8 oz), SpO2 99 %.  PHYSICAL EXAMINATION:   Physical Exam  GENERAL:  70 y.o.-year-old morbidly obese patient lying in the bed lethargic but in no apparent distress  EYES: Pupils equal, round, reactive to light and accommodation. No scleral icterus. Extraocular muscles intact.  HEENT: Head atraumatic, normocephalic. Oropharynx and nasopharynx clear.  NECK:  Supple, no jugular venous distention. No thyroid enlargement, no tenderness.  LUNGS: Poor respiratory effort, no wheezing, rales, rhonchi. No use of accessory muscles of respiration.   CARDIOVASCULAR: S1, S2 normal. No murmurs, rubs, or gallops.  ABDOMEN: Soft, nontender, nondistended. Bowel sounds present. No organomegaly or mass.  EXTREMITIES: No cyanosis, clubbing, +3-4 dependent edema bilaterally.    NEUROLOGIC: Cranial nerves II through XII are intact. No focal Motor or sensory deficits b/l. Globally weak  PSYCHIATRIC: The patient is alert and oriented x 2.  SKIN: No obvious rash, lesion, or ulcer.    LABORATORY PANEL:   CBC  Recent Labs Lab 07/24/15 0313  WBC 8.8  HGB 13.1  HCT 36.6*  PLT 144*   ------------------------------------------------------------------------------------------------------------------  Chemistries   Recent Labs Lab 07/23/15 1948 07/24/15 0313  NA 140 142  K 3.9 4.9  CL 104 111  CO2 30 22  GLUCOSE 119* 120*  BUN 32* 30*  CREATININE 1.01 0.96  CALCIUM 8.7* 8.5*  AST 78*  --   ALT 28  --   ALKPHOS 67  --   BILITOT 2.2*  --    ------------------------------------------------------------------------------------------------------------------  Cardiac Enzymes  Recent Labs Lab 07/24/15 0754  TROPONINI 0.26*   ------------------------------------------------------------------------------------------------------------------  RADIOLOGY:  Dg Chest 2 View  07/23/2015  CLINICAL DATA:  Found on floor at home. EXAM: CHEST  2 VIEW COMPARISON:  None FINDINGS: There is moderate cardiomegaly. There are grossly intact appearances of the visible portions of the transvenous cardiac lead. The lungs are clear. The pulmonary vasculature is normal. There is no pleural effusion. Hilar and mediastinal contours are unremarkable. IMPRESSION: Cardiomegaly.  No acute cardiopulmonary findings. Electronically Signed   By: Ellery Plunk M.D.   On: 07/23/2015 21:31   Dg Pelvis 1-2 Views  07/23/2015  CLINICAL DATA:  Found on floor at home EXAM: PELVIS -  1-2 VIEW COMPARISON:  None FINDINGS: Negative for acute fracture dislocation. Sacroiliac  joints and pubic symphysis appear intact. No soft tissue foreign body. IMPRESSION: Negative. Electronically Signed   By: Ellery Plunkaniel R Mitchell M.D.   On: 07/23/2015 21:31   Ct Head Wo Contrast  07/23/2015  CLINICAL DATA:  Patient found unresponsive on the floor with blood on his face. EXAM: CT HEAD WITHOUT CONTRAST CT MAXILLOFACIAL WITHOUT CONTRAST CT CERVICAL SPINE WITHOUT CONTRAST TECHNIQUE: Multidetector CT imaging of the head, cervical spine, and maxillofacial structures were performed using the standard protocol without intravenous contrast. Multiplanar CT image reconstructions of the cervical spine and maxillofacial structures were also generated. COMPARISON:  None. FINDINGS: CT HEAD FINDINGS Ventricles and sulci are appropriate for patient's age. 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 No evidence for acute intracranial hemorrhage, mass lesion or mass-effect. Orbits are unremarkable. Scalp soft tissues are unremarkable. Mucosal thickening involving the left maxillary sinus. Mastoid air cells unremarkable. Calvarium is intact. CT MAXILLOFACIAL FINDINGS Mucosal thickening involving the left maxillary sinus. Zygomatic archs and pterygoid plates are intact. Maxilla and mandible are intact. Multiple missing teeth are demonstrated. Multiple dental caries. No evidence for acute maxillofacial fracture. CT CERVICAL SPINE FINDINGS Straightening of the normal cervical lordosis. Multilevel degenerative disc disease and facet disease. Preservation of the vertebral body heights. Craniocervical junction is intact. No evidence for acute cervical spine fracture. In the right upper lobe there is a 8 x 7 mm nodule (8 mm mean diameter) on image 94 of series 13. Prevertebral soft tissues unremarkable. IMPRESSION: No acute intracranial process. No acute cervical spine fracture. No acute maxillofacial fracture. Mucosal thickening left maxillary sinus. There is an 8 mm right upper lobe pulmonary nodule. Non-contrast  chest CT at 6-12 months is recommended. If the nodule is stable at time of repeat CT, then future CT at 18-24 months (from today's scan) is considered optional for low-risk patients, but is recommended for high-risk patients. This recommendation follows the consensus statement: Guidelines for Management of Incidental Pulmonary Nodules Detected on CT Images:From the Fleischner Society 2017; published online before print (10.1148/[id number]). Electronically Signed   By: Annia Beltrew  Davis M.D.   On: 07/23/2015 22:08   Ct Cervical Spine Wo Contrast  07/23/2015  CLINICAL DATA:  Patient found unresponsive on the floor with blood on his face. EXAM: CT HEAD WITHOUT CONTRAST CT MAXILLOFACIAL WITHOUT CONTRAST CT CERVICAL SPINE WITHOUT CONTRAST TECHNIQUE: Multidetector CT imaging of the head, cervical spine, and maxillofacial structures were performed using the standard protocol without intravenous contrast. Multiplanar CT image reconstructions of the cervical spine and maxillofacial structures were also generated. COMPARISON:  None. FINDINGS: CT HEAD FINDINGS Ventricles and sulci are appropriate for patient's age. 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 No evidence for acute intracranial hemorrhage, mass lesion or mass-effect. Orbits are unremarkable. Scalp soft tissues are unremarkable. Mucosal thickening involving the left maxillary sinus. Mastoid air cells unremarkable. Calvarium is intact. CT MAXILLOFACIAL FINDINGS Mucosal thickening involving the left maxillary sinus. Zygomatic archs and pterygoid plates are intact. Maxilla and mandible are intact. Multiple missing teeth are demonstrated. Multiple dental caries. No evidence for acute maxillofacial fracture. CT CERVICAL SPINE FINDINGS Straightening of the normal cervical lordosis. Multilevel degenerative disc disease and facet disease. Preservation of the vertebral body heights. Craniocervical junction is intact. No evidence for acute cervical spine  fracture. In the right upper lobe there is a 8 x 7 mm nodule (8 mm mean diameter) on image 94 of series 13. Prevertebral soft tissues  unremarkable. IMPRESSION: No acute intracranial process. No acute cervical spine fracture. No acute maxillofacial fracture. Mucosal thickening left maxillary sinus. There is an 8 mm right upper lobe pulmonary nodule. Non-contrast chest CT at 6-12 months is recommended. If the nodule is stable at time of repeat CT, then future CT at 18-24 months (from today's scan) is considered optional for low-risk patients, but is recommended for high-risk patients. This recommendation follows the consensus statement: Guidelines for Management of Incidental Pulmonary Nodules Detected on CT Images:From the Fleischner Society 2017; published online before print (10.1148/[id number]). Electronically Signed   By: Annia Belt M.D.   On: 07/23/2015 22:08   Ct Maxillofacial Wo Cm  07/23/2015  CLINICAL DATA:  Patient found unresponsive on the floor with blood on his face. EXAM: CT HEAD WITHOUT CONTRAST CT MAXILLOFACIAL WITHOUT CONTRAST CT CERVICAL SPINE WITHOUT CONTRAST TECHNIQUE: Multidetector CT imaging of the head, cervical spine, and maxillofacial structures were performed using the standard protocol without intravenous contrast. Multiplanar CT image reconstructions of the cervical spine and maxillofacial structures were also generated. COMPARISON:  None. FINDINGS: CT HEAD FINDINGS Ventricles and sulci are appropriate for patient's age. 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 No evidence for acute intracranial hemorrhage, mass lesion or mass-effect. Orbits are unremarkable. Scalp soft tissues are unremarkable. Mucosal thickening involving the left maxillary sinus. Mastoid air cells unremarkable. Calvarium is intact. CT MAXILLOFACIAL FINDINGS Mucosal thickening involving the left maxillary sinus. Zygomatic archs and pterygoid plates are intact. Maxilla and mandible are intact.  Multiple missing teeth are demonstrated. Multiple dental caries. No evidence for acute maxillofacial fracture. CT CERVICAL SPINE FINDINGS Straightening of the normal cervical lordosis. Multilevel degenerative disc disease and facet disease. Preservation of the vertebral body heights. Craniocervical junction is intact. No evidence for acute cervical spine fracture. In the right upper lobe there is a 8 x 7 mm nodule (8 mm mean diameter) on image 94 of series 13. Prevertebral soft tissues unremarkable. IMPRESSION: No acute intracranial process. No acute cervical spine fracture. No acute maxillofacial fracture. Mucosal thickening left maxillary sinus. There is an 8 mm right upper lobe pulmonary nodule. Non-contrast chest CT at 6-12 months is recommended. If the nodule is stable at time of repeat CT, then future CT at 18-24 months (from today's scan) is considered optional for low-risk patients, but is recommended for high-risk patients. This recommendation follows the consensus statement: Guidelines for Management of Incidental Pulmonary Nodules Detected on CT Images:From the Fleischner Society 2017; published online before print (10.1148/[id number]). Electronically Signed   By: Annia Belt M.D.   On: 07/23/2015 22:08     ASSESSMENT AND PLAN:   70 year old male with past medical history of hypertension, type 2 diabetes without complication, history of CHF, coronary artery disease, atrial fibrillation, obstructive sleep apnea, chronic lower extremity edema who presented to the hospital after being found down on the floor for prolonged period of time and noted to be in acute rhabdomyolysis.  1. Acute rhabdomyolysis-this was secondary to the patient's fall and patient being on the floor for prolonged period of time. -Continue aggressive IV fluids, CKs are trending down. Renal function is stable.  2. Generalized weakness, frequent falls-etiology unclear. I suspect this is probably deconditioning from his  poor mobility status and obesity. -I will get a physical therapy consult to assess his mobility.  3. Elevated troponin-likely in the setting of acute rhabdomyolysis. No evidence of acute coronary syndrome clinically. -Await further cardiology input, await echocardiogram results.  4. Hypothyroidism-continue Synthroid.  5. History  of obstructive sleep apnea with CO2 retention-continue CPAP at bedtime. -ABG obtained this morning does not show any evidence of hypercarbia.  6. Restless leg syndrome-continue Requip.  7. Essential hypertension-continue indoor, Coreg.  8. Depression-continue Celexa.  9. Atrial fibrillation-rate controlled continue Coreg. -Patient is a high fall risk and therefore not on long term anticoagulation.  All the records are reviewed and case discussed with Care Management/Social Workerr. Management plans discussed with the patient, family and they are in agreement.  CODE STATUS: DO NOT RESUSCITATE  DVT Prophylaxis: Heparin subcutaneous  TOTAL TIME TAKING CARE OF THIS PATIENT: 30 minutes.   POSSIBLE D/C IN 2-3 DAYS, DEPENDING ON CLINICAL CONDITION.   Houston Siren M.D on 07/24/2015 at 1:21 PM  Between 7am to 6pm - Pager - [phone]  After 6pm go to [email or web address] - password [email or web address] [facility]  [facility]  Office  [phone]  CC: Primary care physician; No PCP Per Patient

## 2015-07-24 NOTE — Progress Notes (Signed)
PT Cancellation Note  Patient Details Name: Cherlynn PerchesSamuel Rengel MRN: [id number] DOB: 10/28/1945   Cancelled Treatment:    Reason Eval/Treat Not Completed: Medical issues which prohibited therapy Pt's troponin is elevated, possibly more than would be expected from rhabdomylosis.  Will hold PT until after further work up.  Malachi ProGalen R Bao Coreas 07/24/2015, 1:02 PM

## 2015-07-25 ENCOUNTER — Encounter
Admission: EM | Disposition: A | Discharge status: Home / Self Care | Payer: Self-pay | Source: Home / Self Care | Attending: Specialist

## 2015-07-25 ENCOUNTER — Encounter: Payer: Self-pay | Admitting: Cardiovascular Disease

## 2015-07-25 HISTORY — PX: CARDIAC CATHETERIZATION: SHX172

## 2015-07-25 LAB — BASIC METABOLIC PANEL
Anion gap: 3 — ABNORMAL LOW (ref 5–15)
BUN: 24 mg/dL — AB (ref 6–20)
CO2: 28 mmol/L (ref 22–32)
CREATININE: 0.84 mg/dL (ref 0.61–1.24)
Calcium: 8.2 mg/dL — ABNORMAL LOW (ref 8.9–10.3)
Chloride: 111 mmol/L (ref 101–111)
GFR calc Af Amer: >60 mL/min (ref >60)
GLUCOSE: 117 mg/dL — AB (ref 65–99)
POTASSIUM: 3.6 mmol/L (ref 3.5–5.1)
SODIUM: 142 mmol/L (ref 135–145)

## 2015-07-25 LAB — PROTIME-INR
INR: 1.19
Prothrombin Time: 15.3 seconds — ABNORMAL HIGH (ref 11.4–15.0)

## 2015-07-25 LAB — CK: Total CK: 470 U/L — ABNORMAL HIGH (ref 49–397)

## 2015-07-25 SURGERY — LEFT HEART CATH AND CORONARY ANGIOGRAPHY
Anesthesia: Moderate Sedation | Laterality: Right

## 2015-07-25 MED ORDER — MIDAZOLAM HCL 2 MG/2ML IJ SOLN
INTRAMUSCULAR | Status: AC
  Filled 2015-07-25: qty 2

## 2015-07-25 MED ORDER — SODIUM CHLORIDE 0.9 % WEIGHT BASED INFUSION: 1.0000 mL/kg/h | INTRAVENOUS | Status: DC

## 2015-07-25 MED ORDER — SODIUM CHLORIDE 0.9 % IV SOLN: 250.0000 mL | INTRAVENOUS | Status: DC | PRN

## 2015-07-25 MED ORDER — SODIUM CHLORIDE 0.9% FLUSH
3.0000 mL | Freq: Two times a day (BID) | INTRAVENOUS | Status: DC
  Administered 2015-07-25 – 2015-07-26 (×2): 3 mL via INTRAVENOUS

## 2015-07-25 MED ORDER — ACETAMINOPHEN 325 MG PO TABS: 650.0000 mg | ORAL_TABLET | ORAL | Status: DC | PRN

## 2015-07-25 MED ORDER — SODIUM CHLORIDE 0.9% FLUSH
3.0000 mL | Freq: Two times a day (BID) | INTRAVENOUS | Status: DC
  Administered 2015-07-25: 3 mL via INTRAVENOUS

## 2015-07-25 MED ORDER — SODIUM CHLORIDE 0.9% FLUSH: 3.0000 mL | INTRAVENOUS | Status: DC | PRN

## 2015-07-25 MED ORDER — ASPIRIN 81 MG PO CHEW
81.0000 mg | CHEWABLE_TABLET | ORAL | Status: AC
  Administered 2015-07-25: 81 mg via ORAL

## 2015-07-25 MED ORDER — MIDAZOLAM HCL 2 MG/2ML IJ SOLN
INTRAMUSCULAR | Status: DC | PRN
  Administered 2015-07-25 (×2): 1 mg via INTRAVENOUS

## 2015-07-25 MED ORDER — SODIUM CHLORIDE 0.9 % WEIGHT BASED INFUSION: 1.0000 mL/kg/h | INTRAVENOUS | Status: AC

## 2015-07-25 MED ORDER — HEPARIN (PORCINE) IN NACL 2-0.9 UNIT/ML-% IJ SOLN
INTRAMUSCULAR | Status: AC
  Filled 2015-07-25: qty 1000

## 2015-07-25 MED ORDER — SODIUM CHLORIDE 0.9 % WEIGHT BASED INFUSION: 3.0000 mL/kg/h | INTRAVENOUS | Status: DC

## 2015-07-25 MED ORDER — ONDANSETRON HCL 4 MG/2ML IJ SOLN: 4.0000 mg | Freq: Four times a day (QID) | INTRAMUSCULAR | Status: DC | PRN

## 2015-07-25 MED ORDER — ASPIRIN 81 MG PO CHEW
CHEWABLE_TABLET | ORAL | Status: AC
  Administered 2015-07-25: 81 mg via ORAL
  Filled 2015-07-25: qty 1

## 2015-07-25 MED ORDER — IOPAMIDOL (ISOVUE-300) INJECTION 61%
INTRAVENOUS | Status: DC | PRN
  Administered 2015-07-25: 160 mL via INTRA_ARTERIAL

## 2015-07-25 MED ORDER — CLOPIDOGREL BISULFATE 75 MG PO TABS
75.0000 mg | ORAL_TABLET | Freq: Every day | ORAL | Status: DC
  Administered 2015-07-25 – 2015-07-26 (×2): 75 mg via ORAL
  Filled 2015-07-25 (×2): qty 1

## 2015-07-25 MED ORDER — ENOXAPARIN SODIUM 40 MG/0.4ML ~~LOC~~ SOLN
40.0000 mg | Freq: Two times a day (BID) | SUBCUTANEOUS | Status: DC
  Administered 2015-07-25: 40 mg via SUBCUTANEOUS
  Filled 2015-07-25 (×2): qty 0.4

## 2015-07-25 MED ORDER — FENTANYL CITRATE (PF) 100 MCG/2ML IJ SOLN
INTRAMUSCULAR | Status: DC | PRN
  Administered 2015-07-25 (×2): 25 ug via INTRAVENOUS

## 2015-07-25 MED ORDER — FENTANYL CITRATE (PF) 100 MCG/2ML IJ SOLN
INTRAMUSCULAR | Status: AC
  Filled 2015-07-25: qty 2

## 2015-07-25 SURGICAL SUPPLY — 9 items
CATH INFINITI 5FR ANG PIGTAIL (CATHETERS) ×3 IMPLANT
CATH INFINITI 5FR JL4 (CATHETERS) ×3 IMPLANT
CATH INFINITI JR4 5F (CATHETERS) ×3 IMPLANT
DEVICE CLOSURE MYNXGRIP 5F (Vascular Products) ×3 IMPLANT
KIT MANI 3VAL PERCEP (MISCELLANEOUS) ×3 IMPLANT
NEEDLE PERC 18GX7CM (NEEDLE) ×3 IMPLANT
PACK CARDIAC CATH (CUSTOM PROCEDURE TRAY) ×3 IMPLANT
SHEATH PINNACLE 5F 10CM (SHEATH) ×3 IMPLANT
WIRE EMERALD 3MM-J .035X150CM (WIRE) ×3 IMPLANT

## 2015-07-25 NOTE — Progress Notes (Signed)
[employer] - [place] at [facility]   PATIENT NAME: Jesse PerchesSamuel [name]    MR#:  [id number]  DATE OF BIRTH:  05/14/1945  SUBJECTIVE:   Patient here due to recurrent falls, generalized weakness and also noted to be in acute rhabdomyolysis. CK is much improved today. Much more awake and conversive today. Has any chest pain, shortness of breath, nausea, vomiting. Going for cardiac catheterization this morning.  REVIEW OF SYSTEMS:    Review of Systems  Constitutional: Negative for fever and chills.  HENT: Negative for congestion and tinnitus.   Eyes: Negative for blurred vision and double vision.  Respiratory: Negative for cough, shortness of breath and wheezing.   Cardiovascular: Positive for leg swelling. Negative for chest pain, orthopnea and PND.  Gastrointestinal: Negative for nausea, vomiting, abdominal pain and diarrhea.  Genitourinary: Negative for dysuria and hematuria.  Musculoskeletal: Positive for falls.  Neurological: Positive for weakness. Negative for dizziness, sensory change and focal weakness.  All other systems reviewed and are negative.   Nutrition: Heart healthy Tolerating Diet: Yes Tolerating PT: Await evaluation   DRUG ALLERGIES:   Allergies  Allergen Reactions  . Sulfa Antibiotics     VITALS:  Blood pressure 118/64, pulse 66, temperature 97.6 F (36.4 C), temperature source Oral, resp. rate 18, height 6\' 1"  (1.854 m), weight 138.483 kg (305 lb 4.8 oz), SpO2 99 %.  PHYSICAL EXAMINATION:   Physical Exam  GENERAL:  70 y.o.-year-old morbidly obese patient lying in the bed in no apparent distress  EYES: Pupils equal, round, reactive to light and accommodation. No scleral icterus. Extraocular muscles intact.  HEENT: Head atraumatic, normocephalic. Oropharynx and nasopharynx clear.  NECK:  Supple, no jugular venous distention. No thyroid enlargement, no tenderness.  LUNGS: Poor respiratory effort, no wheezing, rales, rhonchi. No use of accessory  muscles of respiration.  CARDIOVASCULAR: S1, S2 normal. No murmurs, rubs, or gallops.  ABDOMEN: Soft, nontender, nondistended. Bowel sounds present. No organomegaly or mass.  EXTREMITIES: No cyanosis, clubbing, +3-4 dependent edema bilaterally.    NEUROLOGIC: Cranial nerves II through XII are intact. No focal Motor or sensory deficits b/l. Globally weak  PSYCHIATRIC: The patient is alert and oriented x 3.  SKIN: No obvious rash, lesion, or ulcer.    LABORATORY PANEL:   CBC  Recent Labs Lab 07/24/15 0313  WBC 8.8  HGB 13.1  HCT 36.6*  PLT 144*   ------------------------------------------------------------------------------------------------------------------  Chemistries   Recent Labs Lab 07/23/15 1948  07/25/15 0523  NA 140  < > 142  K 3.9  < > 3.6  CL 104  < > 111  CO2 30  < > 28  GLUCOSE 119*  < > 117*  BUN 32*  < > 24*  CREATININE 1.01  < > 0.84  CALCIUM 8.7*  < > 8.2*  AST 78*  --   --   ALT 28  --   --   ALKPHOS 67  --   --   BILITOT 2.2*  --   --   < > = values in this interval not displayed. ------------------------------------------------------------------------------------------------------------------  Cardiac Enzymes  Recent Labs Lab 07/24/15 1438  TROPONINI 0.19*   ------------------------------------------------------------------------------------------------------------------  RADIOLOGY:  Dg Chest 2 View  07/23/2015  CLINICAL DATA:  Found on floor at home. EXAM: CHEST  2 VIEW COMPARISON:  None FINDINGS: There is moderate cardiomegaly. There are grossly intact appearances of the visible portions of the transvenous cardiac lead. The lungs are clear. The pulmonary vasculature is normal. There is no pleural  effusion. Hilar and mediastinal contours are unremarkable. IMPRESSION: Cardiomegaly.  No acute cardiopulmonary findings. Electronically Signed   By: Ellery Plunk M.D.   On: 07/23/2015 21:31   Dg Pelvis 1-2 Views  07/23/2015  CLINICAL DATA:   Found on floor at home EXAM: PELVIS - 1-2 VIEW COMPARISON:  None FINDINGS: Negative for acute fracture dislocation. Sacroiliac joints and pubic symphysis appear intact. No soft tissue foreign body. IMPRESSION: Negative. Electronically Signed   By: Ellery Plunk M.D.   On: 07/23/2015 21:31   Ct Head Wo Contrast  07/23/2015  CLINICAL DATA:  Patient found unresponsive on the floor with blood on his face. EXAM: CT HEAD WITHOUT CONTRAST CT MAXILLOFACIAL WITHOUT CONTRAST CT CERVICAL SPINE WITHOUT CONTRAST TECHNIQUE: Multidetector CT imaging of the head, cervical spine, and maxillofacial structures were performed using the standard protocol without intravenous contrast. Multiplanar CT image reconstructions of the cervical spine and maxillofacial structures were also generated. COMPARISON:  None. FINDINGS: CT HEAD FINDINGS Ventricles and sulci are appropriate for patient's age. 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 No evidence for acute intracranial hemorrhage, mass lesion or mass-effect. Orbits are unremarkable. Scalp soft tissues are unremarkable. Mucosal thickening involving the left maxillary sinus. Mastoid air cells unremarkable. Calvarium is intact. CT MAXILLOFACIAL FINDINGS Mucosal thickening involving the left maxillary sinus. Zygomatic archs and pterygoid plates are intact. Maxilla and mandible are intact. Multiple missing teeth are demonstrated. Multiple dental caries. No evidence for acute maxillofacial fracture. CT CERVICAL SPINE FINDINGS Straightening of the normal cervical lordosis. Multilevel degenerative disc disease and facet disease. Preservation of the vertebral body heights. Craniocervical junction is intact. No evidence for acute cervical spine fracture. In the right upper lobe there is a 8 x 7 mm nodule (8 mm mean diameter) on image 94 of series 13. Prevertebral soft tissues unremarkable. IMPRESSION: No acute intracranial process. No acute cervical spine fracture. No acute  maxillofacial fracture. Mucosal thickening left maxillary sinus. There is an 8 mm right upper lobe pulmonary nodule. Non-contrast chest CT at 6-12 months is recommended. If the nodule is stable at time of repeat CT, then future CT at 18-24 months (from today's scan) is considered optional for low-risk patients, but is recommended for high-risk patients. This recommendation follows the consensus statement: Guidelines for Management of Incidental Pulmonary Nodules Detected on CT Images:From the Fleischner Society 2017; published online before print (10.1148/[id number]). Electronically Signed   By: Annia Belt M.D.   On: 07/23/2015 22:08   Ct Cervical Spine Wo Contrast  07/23/2015  CLINICAL DATA:  Patient found unresponsive on the floor with blood on his face. EXAM: CT HEAD WITHOUT CONTRAST CT MAXILLOFACIAL WITHOUT CONTRAST CT CERVICAL SPINE WITHOUT CONTRAST TECHNIQUE: Multidetector CT imaging of the head, cervical spine, and maxillofacial structures were performed using the standard protocol without intravenous contrast. Multiplanar CT image reconstructions of the cervical spine and maxillofacial structures were also generated. COMPARISON:  None. FINDINGS: CT HEAD FINDINGS Ventricles and sulci are appropriate for patient's age. 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 No evidence for acute intracranial hemorrhage, mass lesion or mass-effect. Orbits are unremarkable. Scalp soft tissues are unremarkable. Mucosal thickening involving the left maxillary sinus. Mastoid air cells unremarkable. Calvarium is intact. CT MAXILLOFACIAL FINDINGS Mucosal thickening involving the left maxillary sinus. Zygomatic archs and pterygoid plates are intact. Maxilla and mandible are intact. Multiple missing teeth are demonstrated. Multiple dental caries. No evidence for acute maxillofacial fracture. CT CERVICAL SPINE FINDINGS Straightening of the normal cervical lordosis. Multilevel degenerative disc  disease and  facet disease. Preservation of the vertebral body heights. Craniocervical junction is intact. No evidence for acute cervical spine fracture. In the right upper lobe there is a 8 x 7 mm nodule (8 mm mean diameter) on image 94 of series 13. Prevertebral soft tissues unremarkable. IMPRESSION: No acute intracranial process. No acute cervical spine fracture. No acute maxillofacial fracture. Mucosal thickening left maxillary sinus. There is an 8 mm right upper lobe pulmonary nodule. Non-contrast chest CT at 6-12 months is recommended. If the nodule is stable at time of repeat CT, then future CT at 18-24 months (from today's scan) is considered optional for low-risk patients, but is recommended for high-risk patients. This recommendation follows the consensus statement: Guidelines for Management of Incidental Pulmonary Nodules Detected on CT Images:From the Fleischner Society 2017; published online before print (10.1148/[id number]). Electronically Signed   By: Annia Belt M.D.   On: 07/23/2015 22:08   Ct Maxillofacial Wo Cm  07/23/2015  CLINICAL DATA:  Patient found unresponsive on the floor with blood on his face. EXAM: CT HEAD WITHOUT CONTRAST CT MAXILLOFACIAL WITHOUT CONTRAST CT CERVICAL SPINE WITHOUT CONTRAST TECHNIQUE: Multidetector CT imaging of the head, cervical spine, and maxillofacial structures were performed using the standard protocol without intravenous contrast. Multiplanar CT image reconstructions of the cervical spine and maxillofacial structures were also generated. COMPARISON:  None. FINDINGS: CT HEAD FINDINGS Ventricles and sulci are appropriate for patient's age. 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 No evidence for acute intracranial hemorrhage, mass lesion or mass-effect. Orbits are unremarkable. Scalp soft tissues are unremarkable. Mucosal thickening involving the left maxillary sinus. Mastoid air cells unremarkable. Calvarium is intact. CT MAXILLOFACIAL FINDINGS Mucosal  thickening involving the left maxillary sinus. Zygomatic archs and pterygoid plates are intact. Maxilla and mandible are intact. Multiple missing teeth are demonstrated. Multiple dental caries. No evidence for acute maxillofacial fracture. CT CERVICAL SPINE FINDINGS Straightening of the normal cervical lordosis. Multilevel degenerative disc disease and facet disease. Preservation of the vertebral body heights. Craniocervical junction is intact. No evidence for acute cervical spine fracture. In the right upper lobe there is a 8 x 7 mm nodule (8 mm mean diameter) on image 94 of series 13. Prevertebral soft tissues unremarkable. IMPRESSION: No acute intracranial process. No acute cervical spine fracture. No acute maxillofacial fracture. Mucosal thickening left maxillary sinus. There is an 8 mm right upper lobe pulmonary nodule. Non-contrast chest CT at 6-12 months is recommended. If the nodule is stable at time of repeat CT, then future CT at 18-24 months (from today's scan) is considered optional for low-risk patients, but is recommended for high-risk patients. This recommendation follows the consensus statement: Guidelines for Management of Incidental Pulmonary Nodules Detected on CT Images:From the Fleischner Society 2017; published online before print (10.1148/[id number]). Electronically Signed   By: Annia Belt M.D.   On: 07/23/2015 22:08     ASSESSMENT AND PLAN:   70 year old male with past medical history of hypertension, type 2 diabetes without complication, history of CHF, coronary artery disease, atrial fibrillation, obstructive sleep apnea, chronic lower extremity edema who presented to the hospital after being found down on the floor for prolonged period of time and noted to be in acute rhabdomyolysis.  1. Acute rhabdomyolysis-this was secondary to the patient's fall and patient being on the floor for prolonged period of time. -Much improved with IV fluids and CKs almost normalized now.  Renal function stable.  2. Generalized weakness, frequent falls-etiology unclear. I suspect this is probably deconditioning  from his poor mobility status and obesity. -Await physical therapy evaluation.  3. Elevated troponin-likely in the setting of acute rhabdomyolysis. No evidence of acute coronary syndrome clinically. -Due to his recurrent falls and possible syncope patient was seen by cardiology and underwent cardiac catheterization today which showed a high-grade stenosis of the RCA which is currently being managed medically. -Continue aspirin, Plavix, Imdur, carvedilol. -Echo showed mild systolic dysfunction with EF of 40%.  4. Hypothyroidism-continue Synthroid.  5. History of obstructive sleep apnea with CO2 retention-continue CPAP at bedtime. -ABG yesterday showing no evidence of hypercarbia.  6. Restless leg syndrome-continue Requip.  7. Essential hypertension-continue Imdur, Coreg.  8. Depression-continue Celexa.  9. Atrial fibrillation-rate controlled continue Coreg. -Patient is a high fall risk and therefore not on long term anticoagulation.  All the records are reviewed and case discussed with Care Management/Social Workerr. Management plans discussed with the patient, family and they are in agreement.  CODE STATUS: DO NOT RESUSCITATE  DVT Prophylaxis: Heparin subcutaneous  TOTAL TIME TAKING CARE OF THIS PATIENT: 30 minutes.   POSSIBLE D/C IN 2-3 DAYS, DEPENDING ON CLINICAL CONDITION.   Houston Siren M.D on 07/25/2015 at 2:36 PM  Between 7am to 6pm - Pager - [phone]  After 6pm go to [email or web address] - password [email or web address] [facility]  [address] [place] [facility]  Office  [phone]  CC: Primary care physician; No PCP Per Patient

## 2015-07-25 NOTE — Progress Notes (Signed)
SUBJECTIVE: No chest pain sleeping comfortably   Filed Vitals:   07/24/15 1206 07/24/15 1935 07/25/15 0506 07/25/15 0509  BP: 122/61 109/60  130/72  Pulse: 69 66  75  Temp: 98.7 F (37.1 C) 98.2 F (36.8 C)  98.1 F (36.7 C)  TempSrc: Oral Oral  Oral  Resp: 19 20  20   Height:      Weight:   305 lb 4.8 oz (138.483 kg)   SpO2: 99% 99%  98%    Intake/Output Summary (Last 24 hours) at 07/25/15 0831 Last data filed at 07/25/15 [id number]  Gross per 24 hour  Intake 1373.33 ml  Output    950 ml  Net 423.33 ml    LABS: Basic Metabolic Panel:  Recent Labs  13/11/6502/16/17 0313 07/25/15 0523  NA 142 142  K 4.9 3.6  CL 111 111  CO2 22 28  GLUCOSE 120* 117*  BUN 30* 24*  CREATININE 0.96 0.84  CALCIUM 8.5* 8.2*   Liver Function Tests:  Recent Labs  07/23/15 1948  AST 78*  ALT 28  ALKPHOS 67  BILITOT 2.2*  PROT 7.6  ALBUMIN 4.4    Recent Labs  07/23/15 1948  LIPASE 14   CBC:  Recent Labs  07/23/15 1948 07/24/15 0313  WBC 10.3 8.8  NEUTROABS 8.7*  --   HGB 14.3 13.1  HCT 41.7 36.6*  MCV 92.8 91.2  PLT 158 144*   Cardiac Enzymes:  Recent Labs  07/23/15 1948 07/24/15 0313 07/24/15 0340 07/24/15 0754 07/24/15 1438 07/25/15 0523  CKTOTAL 2139* 1607*  --   --   --  470*  TROPONINI 0.34*  --  0.31* 0.26* 0.19*  --    BNP: Invalid input(s): POCBNP D-Dimer: No results for input(s): DDIMER in the last 72 hours. Hemoglobin A1C: No results for input(s): HGBA1C in the last 72 hours. Fasting Lipid Panel: No results for input(s): CHOL, HDL, LDLCALC, TRIG, CHOLHDL, LDLDIRECT in the last 72 hours. Thyroid Function Tests: No results for input(s): TSH, T4TOTAL, T3FREE, THYROIDAB in the last 72 hours.  Invalid input(s): FREET3 Anemia Panel: No results for input(s): VITAMINB12, FOLATE, FERRITIN, TIBC, IRON, RETICCTPCT in the last 72 hours.   PHYSICAL EXAM General: Well developed, well nourished, in no acute distress HEENT:  Normocephalic and atramatic Neck:   No JVD.  Lungs: Clear bilaterally to auscultation and percussion. Heart: HRRR . Normal S1 and S2 without gallops or murmurs.  Abdomen: Bowel sounds are positive, abdomen soft and non-tender  Msk:  Back normal, normal gait. Normal strength and tone for age. Extremities: No clubbing, cyanosis or edema.   Neuro: Alert and oriented X 3. Psych:  Good affect, responds appropriately  TELEMETRY:Sinus rhythm  ASSESSMENT AND PLAN: Syncopal episodes with history of diastolic heart failure presented with another episode of syncope in spite of having [name] recent pacemaker implantation. We will assess coronary status by doing cardiac catheterization today.  Principal Problem:   Rhabdomyolysis Active Problems:   Jesse [name]    Jesse [name] A, MD, [facility]ACC 07/25/2015 8:31 AM

## 2015-07-25 NOTE — Clinical Documentation Improvement (Signed)
Internal Medicine Please update your documentation within the medical record to reflect your response to this query. Thank you  Can the diagnosis of altered mental status be further specified?   Transient alteration of awareness  Encephalopathy - Alcoholic, Anoxic/Hypoxia, Drug Induced/Toxic (specify drug), Hepatic, Hypertensive, Hypoglycemic, Metabolic/Septic, Traumatic/post concussive, Wernicke, Other  Other  Clinically Undetermined  Document any associated diagnoses/conditions.  Supporting Information: 07/24/15 H&P.Marland Kitchen.Marland Kitchen."Altered mental status"... 07/24/15 progr note.Marland Kitchen.Marland Kitchen."Patient here due to recurrent falls, generalized weakness and also noted to be in acute rhabdomyolysis. Patient is quite lethargic this morning, wife is at bedside. Patient on CPAP presently but responsive and follows simple commands"..." 07/24/15: TX: "Continue CPAP at bedtime; IV fluid hydration; Hold diuretics; Follow-up CK level; Monitor patient on telemetry for any arrhythmia"  Please exercise your independent, professional judgment when responding. A specific answer is not anticipated or expected.  Thank You,  Toribio Harbourphelia R Skyelynn Rambeau, RN, BSN, CCDS Certified Clinical Documentation Specialist [place]: Health Information Management [phone]

## 2015-07-25 NOTE — Progress Notes (Signed)
PT Cancellation Note  Patient Details Name: Jesse [name] MRN: [id number] DOB: 10/08/1945   Cancelled Treatment:    Reason Eval/Treat Not Completed: Medical issues which prohibited therapy. Pt's chart reviewed. He currently has elevated troponin and is pending a cardiac catheterization today to assess coronary status and is inappropriate to participate in PT evaluation at this time. PT will f/u at a later time and complete evaluation when appropriate.   Adelene IdlerMindy Jo Shamecka Hocutt, PT, DPT  07/25/2015, 9:00 AM [phone]

## 2015-07-25 NOTE — Care Management (Addendum)
Cardiac cath today. Not in room on assessment. Has just returned. No family in room. Will follow up. It is reported in progression that patient lives at home with his wife. 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 Uses CPAP. Admitted following a fall. Toponins elevated, cardiology consult and cath.

## 2015-07-25 NOTE — Progress Notes (Signed)
Report to cheryl telemetry.  Check right groin for bleeding or hematoma.  Patient will be on bedrest for 1 hours post sheath pull---out of bed at 13:55.  Bilateral pulses are doppler 's DP's..Marland Kitchen

## 2015-07-26 DIAGNOSIS — E669 Obesity, unspecified: Secondary | ICD-10-CM

## 2015-07-26 DIAGNOSIS — R7989 Other specified abnormal findings of blood chemistry: Secondary | ICD-10-CM

## 2015-07-26 DIAGNOSIS — G2581 Restless legs syndrome: Secondary | ICD-10-CM

## 2015-07-26 DIAGNOSIS — I890 Lymphedema, not elsewhere classified: Secondary | ICD-10-CM

## 2015-07-26 DIAGNOSIS — G4733 Obstructive sleep apnea (adult) (pediatric): Secondary | ICD-10-CM

## 2015-07-26 DIAGNOSIS — R778 Other specified abnormalities of plasma proteins: Secondary | ICD-10-CM

## 2015-07-26 DIAGNOSIS — I10 Essential (primary) hypertension: Secondary | ICD-10-CM

## 2015-07-26 DIAGNOSIS — G9341 Metabolic encephalopathy: Secondary | ICD-10-CM

## 2015-07-26 LAB — BASIC METABOLIC PANEL
Anion gap: 2 — ABNORMAL LOW (ref 5–15)
BUN: 19 mg/dL (ref 6–20)
CHLORIDE: 111 mmol/L (ref 101–111)
CO2: 28 mmol/L (ref 22–32)
CREATININE: 0.83 mg/dL (ref 0.61–1.24)
Calcium: 8.3 mg/dL — ABNORMAL LOW (ref 8.9–10.3)
GFR calc non Af Amer: >60 mL/min (ref >60)
Glucose, Bld: 130 mg/dL — ABNORMAL HIGH (ref 65–99)
Potassium: 3.6 mmol/L (ref 3.5–5.1)
Sodium: 141 mmol/L (ref 135–145)

## 2015-07-26 LAB — CK: Total CK: 212 U/L (ref 49–397)

## 2015-07-26 MED ORDER — LISINOPRIL 5 MG PO TABS: 5.0000 mg | ORAL_TABLET | Freq: Every day | ORAL | Status: DC

## 2015-07-26 MED ORDER — CLOPIDOGREL BISULFATE 75 MG PO TABS: 75.0000 mg | ORAL_TABLET | Freq: Every day | ORAL | Status: AC

## 2015-07-26 NOTE — Evaluation (Signed)
Physical Therapy Evaluation Patient Details Name: Jesse PerchesSamuel Twilley MRN: [id number] DOB: 11/22/1945 Today's Date: 07/26/2015   History of Present Illness  70 yo M presented to ED after being found in the floor for a 2 day period by his wife due to a LOC. He was found to have an onset of rhabdomyolysis. PMH includes HTN, DM, CHF, CAD, a fib, and a pacemaker placement.  Clinical Impression  Pt demonstrated generalized weakness and difficulty walking due to extended period of time of inactivity. Pt c/o dizziness initially with BP 132/73. Symptoms clears as session progressed. He requires supervision to min guard for ambulation up to 125 ft with/without AD. Pt demonstrates deficits of strength, endurance, and ambulation, and HHPT is recommended to progress pt towards PLOF. PT recommended pt ambulate with his SPC at this time.     Follow Up Recommendations Home health PT    Equipment Recommendations  None recommended by PT ([facility]    Recommendations for Other Services       Precautions / Restrictions Precautions Precautions: Fall;ICD/Pacemaker      Mobility  Bed Mobility Overal bed mobility: Independent                Transfers Overall transfer level: Modified independent Equipment used: None                Ambulation/Gait Ambulation/Gait assistance: Supervision with AD; min guard without AD Ambulation Distance (Feet): 125 Feet Assistive device: Rolling walker (2 wheeled);None Gait Pattern/deviations: Decreased stride length;Drifts right/left;Wide base of support Gait velocity: reduced   General Gait Details: Pt generally steady with no LOB.   Stairs            Wheelchair Mobility    Modified Rankin (Stroke Patients Only)       Balance Overall balance assessment: History of Falls;Needs assistance Sitting-balance support: Feet supported Sitting balance-Leahy Scale: Normal     Standing balance support: No upper extremity  supported Standing balance-Leahy Scale: Fair Standing balance comment: no LOB                             Pertinent Vitals/Pain Pain Assessment: 0-10 Pain Score: 3  Pain Location: headache Pain Descriptors / Indicators: Aching (intermittent) Pain Intervention(s): Limited activity within patient's tolerance;Monitored during session    Home Living Family/patient expects to be discharged to:: Private residence Living Arrangements: Spouse/significant other;Children Available Help at Discharge: Family Type of Home: House Home Access: Level entry     Home Layout: One level        Prior Function Level of Independence: Independent         Comments: Pt I with ADLs, 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     Hand Dominance        Extremity/Trunk Assessment   Upper Extremity Assessment: Overall WFL for tasks assessed           Lower Extremity Assessment: Overall WFL for tasks assessed         Communication   Communication: No difficulties  Cognition Arousal/Alertness: Awake/alert Behavior During Therapy: WFL for tasks assessed/performed Overall Cognitive Status: Within Functional Limits for tasks assessed                      General Comments General comments (skin integrity, edema, etc.): severe edema B LE and feet    Exercises Other Exercises Other Exercises: Pt ambulated 75 ft with FWW, 50 ft  without AD,  and 125 ft without AD with min guard. Generally steady gait with no LOB. UE support would increase stability for longer distances and uneven surfaces.      Assessment/Plan    PT Assessment Patient needs continued PT services  PT Diagnosis Difficulty walking;Generalized weakness   PT Problem List Decreased strength;Decreased activity tolerance;Decreased balance;Decreased mobility;Obesity  PT Treatment Interventions DME instruction;Gait training;Stair training;Therapeutic activities;Therapeutic exercise;Balance  training;Neuromuscular re-education;Patient/family education   PT Goals (Current goals can be found in the Care Plan section) Acute Rehab PT Goals Patient Stated Goal: to go home PT Goal Formulation: With patient Time For Goal Achievement: 08/09/15 Potential to Achieve Goals: Fair    Frequency Min 2X/week   Barriers to discharge Inaccessible home environment;Decreased caregiver support      Co-evaluation               End of Session Equipment Utilized During Treatment: Gait belt Activity Tolerance: Patient tolerated treatment well Patient left: in chair;with call bell/phone within reach;with chair alarm set Nurse Communication: Mobility status         Time: 1610-9604 PT Time Calculation (min) (ACUTE ONLY): 24 min   Charges:   PT Evaluation $PT Eval Moderate Complexity: 1 Procedure     PT G Codes:        Adelene Idler, PT, DPT  07/26/2015, 10:10 AM [phone]

## 2015-07-26 NOTE — Care Management (Signed)
Patient admitted post fall.  Found to be in Rhabdomyolysis.  Patient lives at home with his wife.  Patient states that he has a CPAP in the home, and uses a cane for ambulation.  Patient states that drives himself.  PT has recommended home health PT.  Patient was provided agency preference list.  Patient does not have preference.  Referral [facility] made to [facility].  Barbara CowerJason with [facility] notified.  Informed by Barbara CowerJason that Patient can not be seen until after his initial appointment on 08/02/15.  Patient is aware.  RNCM signing off

## 2015-07-26 NOTE — Progress Notes (Signed)
Prayer provided.

## 2015-07-26 NOTE — Discharge Summary (Signed)
[facility] - [place] at [facility]   PATIENT NAME: Jesse [name]    MR#:  [id number]  DATE OF BIRTH:  03/27/1946  DATE OF ADMISSION:  07/23/2015 ADMITTING PHYSICIAN: Ihor Austin, MD  DATE OF DISCHARGE: No discharge date for patient encounter.  PRIMARY CARE PHYSICIAN: No PCP Per Patient     ADMISSION DIAGNOSIS:  Pulmonary nodule [R91.1] Wrist laceration, right, initial encounter [S61.511A] Traumatic rhabdomyolysis, initial encounter (HCC) [T79.6XXA] Altered mental status, unspecified altered mental status type [R41.82]  DISCHARGE DIAGNOSIS:  Principal Problem:   Rhabdomyolysis Active Problems:   Fall   Generalized weakness   Elevated troponin   S/P cardiac catheterization   Encephalopathy, metabolic   Obstructive sleep apnea   Restless leg syndrome   Essential hypertension   Lymphedema   Obesity   SECONDARY DIAGNOSIS:   Past Medical History  Diagnosis Date  . Hypertension   . Diabetes mellitus without complication (HCC)   . CHF (congestive heart failure) (HCC)   . CAD (coronary artery disease)   . A-fib (HCC)   . Sleep apnea   . Chronic edema     .pro HOSPITAL COURSE:   The patient is a 71 year old Caucasian male with past medical history significant for history of diabetes, congestive heart failure, coronary artery disease, atrial fibrillation, obstructive sleep apnea, essential hypertension, chronic lymphedema, who presents to the hospital with complaints of fall and inability to get up from the floor for  3 or 4 hours. Patient was confused. CT of head and C-spine showed no abnormalities. In emergency room, he was noted to have elevated troponin and rhabdomyolysis of about 2100. Patient was given IV fluids and the rhabdomyolysis resolved. Patient was seen by cardiologist, Dr. Welton Flakes, who recommended cardiac catheterization, which was performed on 07/25/2015, revealing 99% obstruction of proximal RCA with good collaterals. Plavix was added  to medical regiment. Follow-up was recommended as outpatient. Patient was seen by physical therapist and recommended home health physical therapy. Discussion by problem: 1.  Acute rhabdomyolysis-this was secondary to the patient's fall and patient being on the floor for prolonged period of time. -Much improved with IV fluids and CKs normalized now. Renal function remains stable. Patient still complains of some cramping in his thighs, we'll suspend statin for now until he seen by primary care physician/cardiologist , statin could be restarted at lower dose and advanced as outpatient.  2. Generalized weakness, frequent falls-etiology unclear. Suspected due to deconditioning from his poor mobility status and obesity.Physical therapy recommended home health physical therapy.  3. Elevated troponin-likely in the setting of acute rhabdomyolysis. No evidence of acute coronary syndrome clinically. -Due to his recurrent falls and possible syncope patient was seen by cardiology and underwent cardiac catheterization April 17th,  which showed a high-grade stenosis of proximal RCA which is currently being managed medically, Plavix was added to medical regiment. -Continue aspirin, Plavix, Imdur, carvedilol. -Echo showed mild systolic dysfunction with EF of 40%. Would benefit from an ACE inhibitor/ARB, but-patient's blood pressure is relatively low, will initiate ACE inhibitor at low-dose, to be advanced as outpatient  4. Hypothyroidism-continue Synthroid.  5. History of obstructive sleep apnea with CO2 retention-continue CPAP at bedtime. -ABG yesterday showing no evidence of hypercarbia.  6. Restless leg syndrome-continue Requip.  7. Essential hypertension-continue Imdur, Coreg, low-dose of lisinopril.  8. Depression-continue Celexa.  9. Atrial fibrillation-rate controlled continue Coreg.  -Patient is a high fall risk and therefore not on long term anticoagulation, may benefit from anticoagulation  initiation as outpatient  DISCHARGE  CONDITIONS:   Stable  CONSULTS OBTAINED:  Treatment Team:  Laurier Nancy, MD  DRUG ALLERGIES:   Allergies  Allergen Reactions  . Sulfa Antibiotics     DISCHARGE MEDICATIONS:   Current Discharge Medication List    START taking these medications   Details  clopidogrel (PLAVIX) 75 MG tablet Take 1 tablet (75 mg total) by mouth daily. Qty: 30 tablet, Refills: 6      CONTINUE these medications which have NOT CHANGED   Details  Ascorbic Acid (VITAMIN C) 1000 MG tablet Take 1,000 mg by mouth daily.    bumetanide (BUMEX) 1 MG tablet Take 3 tablets by mouth daily. Pt takes 2 tablets in the AM and 1 tablet in the afternoon as directed Refills: 0    carvedilol (COREG) 3.125 MG tablet Take 1 tablet by mouth 2 (two) times daily. Refills: 3    cholecalciferol (VITAMIN D) 400 units TABS tablet Take 2,000 Units by mouth.    citalopram (CELEXA) 20 MG tablet Take 20 mg by mouth daily. Refills: 0    ferrous sulfate 325 (65 FE) MG tablet Take 325 mg by mouth daily with breakfast.    gabapentin (NEURONTIN) 400 MG capsule Take 1 capsule by mouth 3 (three) times daily. Refills: 0    glimepiride (AMARYL) 2 MG tablet Take 2 mg by mouth daily with breakfast.    isosorbide mononitrate (IMDUR) 60 MG 24 hr tablet Take 60 mg by mouth daily.    levothyroxine (SYNTHROID, LEVOTHROID) 25 MCG tablet Take 25 mcg by mouth daily. Refills: 0    lisinopril (PRINIVIL,ZESTRIL) 20 MG tablet Take 20 mg by mouth daily.    LORazepam (ATIVAN) 1 MG tablet Take 1 mg by mouth 2 (two) times daily. Refills: 0    nitroGLYCERIN (NITROSTAT) 0.4 MG SL tablet Place 0.4 mg under the tongue every 5 (five) minutes as needed for chest pain.    pantoprazole (PROTONIX) 40 MG tablet Take 40 mg by mouth daily. Refills: 0    potassium chloride SA (K-DUR,KLOR-CON) 20 MEQ tablet Take 60 mEq by mouth daily.    rOPINIRole (REQUIP) 2 MG tablet Take 1 tablet by mouth 3 (three) times  daily. Refills: 0    spironolactone (ALDACTONE) 25 MG tablet Take 25 mg by mouth daily. Refills: 0    valsartan (DIOVAN) 160 MG tablet Take 160 mg by mouth 2 (two) times daily. Investigational drug from [employer], according to the label this could actually be a placebo    vitamin B-12 (CYANOCOBALAMIN) 1000 MCG tablet Take 1,000 mcg by mouth daily.      STOP taking these medications     atorvastatin (LIPITOR) 80 MG tablet          DISCHARGE INSTRUCTIONS:    Patient is to follow up with cardiologist as outpatient, to establish primary care physician and be seen within one week after discharge  If you experience worsening of your admission symptoms, develop shortness of breath, life threatening emergency, suicidal or homicidal thoughts you must seek medical attention immediately by calling 911 or calling your MD immediately  if symptoms less severe.  You Must read complete instructions/literature along with all the possible adverse reactions/side effects for all the Medicines you take and that have been prescribed to you. Take any new Medicines after you have completely understood and accept all the possible adverse reactions/side effects.   Please note  You were cared for by a hospitalist during your hospital stay. If you have any questions about  your discharge medications or the care you received while you were in the hospital after you are discharged, you can call the unit and asked to speak with the hospitalist on call if the hospitalist that took care of you is not available. Once you are discharged, your primary care physician will handle any further medical issues. Please note that NO REFILLS for any discharge medications will be authorized once you are discharged, as it is imperative that you return to your primary care physician (or establish a relationship with a primary care physician if you do not have one) for your aftercare needs so that they can reassess your  need for medications and monitor your lab values.    Today   CHIEF COMPLAINT:   Chief Complaint  Patient presents with  . Fall  . Laceration    HISTORY OF PRESENT ILLNESS:  Jesse [name]  is a 70 y.o. male with a known history of diabetes, congestive heart failure, coronary artery disease, atrial fibrillation, obstructive sleep apnea, essential hypertension, chronic lymphedema, who presents to the hospital with complaints of fall and inability to get up from the floor for  3 or 4 hours. Patient was confused. CT of head and C-spine showed no abnormalities. In emergency room, he was noted to have elevated troponin and rhabdomyolysis of about 2100. Patient was given IV fluids and the rhabdomyolysis resolved. Patient was seen by cardiologist, Dr. Welton Flakes, who recommended cardiac catheterization, which was performed on 07/25/2015, revealing 99% obstruction of proximal RCA with good collaterals. Plavix was added to medical regiment. Follow-up was recommended as outpatient. Patient was seen by physical therapist and recommended home health physical therapy. Discussion by problem: 1.  Acute rhabdomyolysis-this was secondary to the patient's fall and patient being on the floor for prolonged period of time. -Much improved with IV fluids and CKs normalized now. Renal function remains stable. Patient still complains of some cramping in his thighs, we'll suspend statin for now until he seen by primary care physician/cardiologist , statin could be restarted at lower dose and advanced as outpatient.  2. Generalized weakness, frequent falls-etiology unclear. Suspected due to deconditioning from his poor mobility status and obesity.Physical therapy recommended home health physical therapy.  3. Elevated troponin-likely in the setting of acute rhabdomyolysis. No evidence of acute coronary syndrome clinically. -Due to his recurrent falls and possible syncope patient was seen by cardiology and underwent cardiac  catheterization April 17th,  which showed a high-grade stenosis of proximal RCA which is currently being managed medically, Plavix was added to medical regiment. -Continue aspirin, Plavix, Imdur, carvedilol. -Echo showed mild systolic dysfunction with EF of 40%. Would benefit from an ACE inhibitor/ARB, but-patient's blood pressure is relatively low, will initiate ACE inhibitor at low-dose, to be advanced as outpatient  4. Hypothyroidism-continue Synthroid.  5. History of obstructive sleep apnea with CO2 retention-continue CPAP at bedtime. -ABG yesterday showing no evidence of hypercarbia.  6. Restless leg syndrome-continue Requip.  7. Essential hypertension-continue Imdur, Coreg, low-dose of lisinopril.  8. Depression-continue Celexa.  9. Atrial fibrillation-rate controlled continue Coreg.  -Patient is a high fall risk and therefore not on long term anticoagulation, may benefit from anticoagulation initiation as outpatient    VITAL SIGNS:  Blood pressure 128/73, pulse 69, temperature 97.8 F (36.6 C), temperature source Oral, resp. rate 20, height 6\' 1"  (1.854 m), weight 138.483 kg (305 lb 4.8 oz), SpO2 100 %.  I/O:   Intake/Output Summary (Last 24 hours) at 07/26/15 1123 Last data filed at 07/26/15 [phone]  Gross per 24 hour  Intake 1631.67 ml  Output    950 ml  Net 681.67 ml    PHYSICAL EXAMINATION:  GENERAL:  70 y.o.-year-old patient lying in the bed with no acute distress.  EYES: Pupils equal, round, reactive to light and accommodation. No scleral icterus. Extraocular muscles intact.  HEENT: Head atraumatic, normocephalic. Oropharynx and nasopharynx clear.  NECK:  Supple, no jugular venous distention. No thyroid enlargement, no tenderness.  LUNGS: Normal breath sounds bilaterally, no wheezing, rales,rhonchi or crepitation. No use of accessory muscles of respiration.  CARDIOVASCULAR: S1, S2 normal. No murmurs, rubs, or gallops.  ABDOMEN: Soft, non-tender, non-distended. Bowel  sounds present. No organomegaly or mass.  EXTREMITIES: 3+ lower extremity and pedal edema, no cyanosis, or clubbing.  NEUROLOGIC: Cranial nerves II through XII are intact. Muscle strength 5/5 in all extremities. Sensation intact. Gait not checked.  PSYCHIATRIC: The patient is alert and oriented x 3.  SKIN: No obvious rash, lesion, or ulcer.   DATA REVIEW:   CBC  Recent Labs Lab 07/24/15 0313  WBC 8.8  HGB 13.1  HCT 36.6*  PLT 144*    Chemistries   Recent Labs Lab 07/23/15 1948  07/26/15 0430  NA 140  < > 141  K 3.9  < > 3.6  CL 104  < > 111  CO2 30  < > 28  GLUCOSE 119*  < > 130*  BUN 32*  < > 19  CREATININE 1.01  < > 0.83  CALCIUM 8.7*  < > 8.3*  AST 78*  --   --   ALT 28  --   --   ALKPHOS 67  --   --   BILITOT 2.2*  --   --   < > = values in this interval not displayed.  Cardiac Enzymes  Recent Labs Lab 07/24/15 1438  TROPONINI 0.19*    Microbiology Results  No results found for this or any previous visit.  RADIOLOGY:  No results found.  EKG:   Orders placed or performed during the hospital encounter of 07/23/15  . ED EKG  . ED EKG      Management plans discussed with the patient, family and they are in agreement.  CODE STATUS:     Code Status Orders        Start     Ordered   07/24/15 1024  Do not attempt resuscitation (DNR)   Continuous    Question Answer Comment  In the event of cardiac or respiratory ARREST Do not call a "code blue"   In the event of cardiac or respiratory ARREST Do not perform Intubation, CPR, defibrillation or ACLS   In the event of cardiac or respiratory ARREST Use medication by any route, position, wound care, and other measures to relive pain and suffering. May use oxygen, suction and manual treatment of airway obstruction as needed for comfort.      07/24/15 1023    Code Status History    Date Active Date Inactive Code Status Order ID Comments User Context   07/24/2015  2:36 AM 07/24/2015 10:23 AM Full Code  [id number]  Ihor AustinPavan Pyreddy, MD ED      TOTAL TIME TAKING CARE OF THIS PATIENT: 40 minutes.    Katharina CaperVAICKUTE,Tahesha Skeet M.D on 07/26/2015 at 11:23 AM  Between 7am to 6pm - Pager - [phone]  After 6pm go to [email or web address] - password [email or web address] [facility]  Office  [phone]  CC: Primary care physician; No PCP Per Patient

## 2015-07-26 NOTE — Care Management Important Message (Signed)
Important Message  Patient Details  Name: Jesse PerchesSamuel Radi MRN: [id number] Date of Birth: 11/19/1945   Medicare Important Message Given:  Yes    Olegario MessierKathy A Candia Kingsbury 07/26/2015, 10:30 AM

## 2015-07-26 NOTE — Progress Notes (Signed)
SUBJECTIVE: No chest pain or shortness of breath sitting comfortably eating breakfast. F2   Filed Vitals:   07/25/15 1420 07/25/15 1519 07/25/15 2118 07/26/15 0417  BP: 118/64 133/72 142/74 134/60  Pulse: 66 66 63 69  Temp: 97.6 F (36.4 C) 97.3 F (36.3 C) 97.8 F (36.6 C) 97.8 F (36.6 C)  TempSrc: Oral Oral Oral Oral  Resp: 18 20 20 20   Height:      Weight:      SpO2: 99% 100% 100% 98%    Intake/Output Summary (Last 24 hours) at 07/26/15 0906 Last data filed at 07/26/15 0417  Gross per 24 hour  Intake 1391.67 ml  Output   1250 ml  Net 141.67 ml    LABS: Basic Metabolic Panel:  Recent Labs  16/01/9603/17/17 0523 07/26/15 0430  NA 142 141  K 3.6 3.6  CL 111 111  CO2 28 28  GLUCOSE 117* 130*  BUN 24* 19  CREATININE 0.84 0.83  CALCIUM 8.2* 8.3*   Liver Function Tests:  Recent Labs  07/23/15 1948  AST 78*  ALT 28  ALKPHOS 67  BILITOT 2.2*  PROT 7.6  ALBUMIN 4.4    Recent Labs  07/23/15 1948  LIPASE 14   CBC:  Recent Labs  07/23/15 1948 07/24/15 0313  WBC 10.3 8.8  NEUTROABS 8.7*  --   HGB 14.3 13.1  HCT 41.7 36.6*  MCV 92.8 91.2  PLT 158 144*   Cardiac Enzymes:  Recent Labs  07/24/15 0313 07/24/15 0340 07/24/15 0754 07/24/15 1438 07/25/15 0523 07/26/15 0430  CKTOTAL 1607*  --   --   --  470* 212  TROPONINI  --  0.31* 0.26* 0.19*  --   --    BNP: Invalid input(s): POCBNP D-Dimer: No results for input(s): DDIMER in the last 72 hours. Hemoglobin A1C: No results for input(s): HGBA1C in the last 72 hours. Fasting Lipid Panel: No results for input(s): CHOL, HDL, LDLCALC, TRIG, CHOLHDL, LDLDIRECT in the last 72 hours. Thyroid Function Tests: No results for input(s): TSH, T4TOTAL, T3FREE, THYROIDAB in the last 72 hours.  Invalid input(s): FREET3 Anemia Panel: No results for input(s): VITAMINB12, FOLATE, FERRITIN, TIBC, IRON, RETICCTPCT in the last 72 hours.   PHYSICAL EXAM General: Well developed, well nourished, in no acute  distress HEENT:  Normocephalic and atramatic Neck:  No JVD.  Lungs: Clear bilaterally to auscultation and percussion. Heart: HRRR . Normal S1 and S2 without gallops or murmurs.  Abdomen: Bowel sounds are positive, abdomen soft and non-tender  Msk:  Back normal, normal gait. Normal strength and tone for age. Extremities: No clubbing, cyanosis or edema.   Neuro: Alert and oriented X 3. Psych:  Good affect, responds appropriately  TELEMETRY:Sinus rhythm  ASSESSMENT AND PLAN: Patient has 1 vessel coronary artery disease with high-grade lesion in the mid RCA but appears to be chronically occluded with bridging collaterals. This severely calcified thus not a candidate for revascularization of this vessel. LAD and left circumflex had no significant disease and had normal left ventricle ejection fraction. Plavix was added to the current regimen and patient can go home with follow-up in the office next Tuesday at 10 AM. We will sign off.  Principal Problem:   Rhabdomyolysis Active Problems:   Mathis BudFall    Dori Devino A, MD, [facility] 07/26/2015 9:06 AM

## 2015-07-26 NOTE — Progress Notes (Signed)
Discharge instructions given. IV's and tele removed. Patient's wife is on the way now. Prescriptions sent to pharmacy. Given education on new meds as well as after care from cardiac cath. No questions at this time. Portable DNR signed by MD and given to patient.

## 2015-08-02 DIAGNOSIS — I10 Essential (primary) hypertension: Secondary | ICD-10-CM | POA: Diagnosis not present

## 2015-08-02 DIAGNOSIS — I739 Peripheral vascular disease, unspecified: Secondary | ICD-10-CM | POA: Diagnosis not present

## 2015-08-02 DIAGNOSIS — I2510 Atherosclerotic heart disease of native coronary artery without angina pectoris: Secondary | ICD-10-CM | POA: Diagnosis not present

## 2015-08-02 DIAGNOSIS — G4733 Obstructive sleep apnea (adult) (pediatric): Secondary | ICD-10-CM | POA: Diagnosis not present

## 2015-08-02 DIAGNOSIS — I509 Heart failure, unspecified: Secondary | ICD-10-CM | POA: Diagnosis not present

## 2015-08-22 DIAGNOSIS — E118 Type 2 diabetes mellitus with unspecified complications: Secondary | ICD-10-CM | POA: Diagnosis not present

## 2015-08-22 DIAGNOSIS — I2510 Atherosclerotic heart disease of native coronary artery without angina pectoris: Secondary | ICD-10-CM | POA: Diagnosis not present

## 2015-08-22 DIAGNOSIS — I890 Lymphedema, not elsewhere classified: Secondary | ICD-10-CM | POA: Insufficient documentation

## 2015-08-22 DIAGNOSIS — D500 Iron deficiency anemia secondary to blood loss (chronic): Secondary | ICD-10-CM | POA: Diagnosis not present

## 2015-08-22 DIAGNOSIS — I5032 Chronic diastolic (congestive) heart failure: Secondary | ICD-10-CM | POA: Diagnosis not present

## 2015-08-22 DIAGNOSIS — G2581 Restless legs syndrome: Secondary | ICD-10-CM | POA: Diagnosis not present

## 2015-08-22 DIAGNOSIS — E785 Hyperlipidemia, unspecified: Secondary | ICD-10-CM | POA: Diagnosis not present

## 2015-08-26 DIAGNOSIS — M67431 Ganglion, right wrist: Secondary | ICD-10-CM | POA: Diagnosis not present

## 2015-08-26 DIAGNOSIS — E119 Type 2 diabetes mellitus without complications: Secondary | ICD-10-CM | POA: Diagnosis not present

## 2015-08-26 DIAGNOSIS — E039 Hypothyroidism, unspecified: Secondary | ICD-10-CM | POA: Diagnosis not present

## 2015-09-07 DIAGNOSIS — M67431 Ganglion, right wrist: Secondary | ICD-10-CM | POA: Diagnosis not present

## 2015-09-07 DIAGNOSIS — M7021 Olecranon bursitis, right elbow: Secondary | ICD-10-CM | POA: Diagnosis not present

## 2015-09-17 ENCOUNTER — Encounter (HOSPITAL_COMMUNITY): Payer: Self-pay | Admitting: Emergency Medicine

## 2015-09-17 ENCOUNTER — Observation Stay (HOSPITAL_COMMUNITY)
Admission: EM | Admit: 2015-09-17 | Discharge: 2015-09-19 | Disposition: A | Discharge status: Home / Self Care | Payer: Medicare Other | Attending: Family Medicine | Admitting: Family Medicine

## 2015-09-17 DIAGNOSIS — Z87891 Personal history of nicotine dependence: Secondary | ICD-10-CM | POA: Insufficient documentation

## 2015-09-17 DIAGNOSIS — I2510 Atherosclerotic heart disease of native coronary artery without angina pectoris: Secondary | ICD-10-CM | POA: Diagnosis not present

## 2015-09-17 DIAGNOSIS — E119 Type 2 diabetes mellitus without complications: Secondary | ICD-10-CM | POA: Diagnosis not present

## 2015-09-17 DIAGNOSIS — Z950 Presence of cardiac pacemaker: Secondary | ICD-10-CM | POA: Insufficient documentation

## 2015-09-17 DIAGNOSIS — I110 Hypertensive heart disease with heart failure: Secondary | ICD-10-CM | POA: Diagnosis not present

## 2015-09-17 DIAGNOSIS — N179 Acute kidney failure, unspecified: Secondary | ICD-10-CM | POA: Diagnosis not present

## 2015-09-17 DIAGNOSIS — Z7901 Long term (current) use of anticoagulants: Secondary | ICD-10-CM | POA: Diagnosis not present

## 2015-09-17 DIAGNOSIS — Z79899 Other long term (current) drug therapy: Secondary | ICD-10-CM | POA: Diagnosis not present

## 2015-09-17 DIAGNOSIS — D696 Thrombocytopenia, unspecified: Secondary | ICD-10-CM | POA: Diagnosis not present

## 2015-09-17 DIAGNOSIS — I959 Hypotension, unspecified: Secondary | ICD-10-CM | POA: Diagnosis not present

## 2015-09-17 DIAGNOSIS — D649 Anemia, unspecified: Secondary | ICD-10-CM

## 2015-09-17 DIAGNOSIS — I890 Lymphedema, not elsewhere classified: Secondary | ICD-10-CM

## 2015-09-17 DIAGNOSIS — R079 Chest pain, unspecified: Secondary | ICD-10-CM | POA: Diagnosis present

## 2015-09-17 DIAGNOSIS — R072 Precordial pain: Secondary | ICD-10-CM | POA: Diagnosis present

## 2015-09-17 DIAGNOSIS — I509 Heart failure, unspecified: Secondary | ICD-10-CM | POA: Insufficient documentation

## 2015-09-17 DIAGNOSIS — I10 Essential (primary) hypertension: Secondary | ICD-10-CM | POA: Diagnosis present

## 2015-09-17 DIAGNOSIS — Z7984 Long term (current) use of oral hypoglycemic drugs: Secondary | ICD-10-CM | POA: Diagnosis not present

## 2015-09-17 DIAGNOSIS — G4733 Obstructive sleep apnea (adult) (pediatric): Secondary | ICD-10-CM | POA: Diagnosis present

## 2015-09-17 LAB — BASIC METABOLIC PANEL
Anion gap: 9 (ref 5–15)
BUN: 91 mg/dL — AB (ref 6–20)
CALCIUM: 7.9 mg/dL — AB (ref 8.9–10.3)
CHLORIDE: 106 mmol/L (ref 101–111)
CO2: 20 mmol/L — ABNORMAL LOW (ref 22–32)
CREATININE: 2.56 mg/dL — AB (ref 0.61–1.24)
GFR calc Af Amer: 28 mL/min — ABNORMAL LOW (ref >60)
GFR calc non Af Amer: 24 mL/min — ABNORMAL LOW (ref >60)
Glucose, Bld: 139 mg/dL — ABNORMAL HIGH (ref 65–99)
Potassium: 4.9 mmol/L (ref 3.5–5.1)
SODIUM: 135 mmol/L (ref 135–145)

## 2015-09-17 LAB — I-STAT CG4 LACTIC ACID, ED: LACTIC ACID, VENOUS: 1.23 mmol/L (ref 0.5–2.0)

## 2015-09-17 LAB — D-DIMER, QUANTITATIVE: D-Dimer, Quant: 0.37 ug/mL-FEU (ref 0.00–0.50)

## 2015-09-17 LAB — TROPONIN I

## 2015-09-17 MED ORDER — METOCLOPRAMIDE HCL 5 MG/ML IJ SOLN
10.0000 mg | Freq: Once | INTRAMUSCULAR | Status: AC
  Administered 2015-09-17: 10 mg via INTRAVENOUS
  Filled 2015-09-17: qty 2

## 2015-09-17 MED ORDER — SODIUM CHLORIDE 0.9 % IV SOLN
1000.0000 mL | INTRAVENOUS | Status: DC
  Administered 2015-09-17 – 2015-09-18 (×2): 1000 mL via INTRAVENOUS

## 2015-09-17 MED ORDER — ASPIRIN 81 MG PO CHEW
324.0000 mg | CHEWABLE_TABLET | Freq: Once | ORAL | Status: AC
  Administered 2015-09-17: 324 mg via ORAL
  Filled 2015-09-17: qty 4

## 2015-09-17 MED ORDER — DIPHENHYDRAMINE HCL 50 MG/ML IJ SOLN
25.0000 mg | Freq: Once | INTRAMUSCULAR | Status: AC
  Administered 2015-09-17: 25 mg via INTRAVENOUS
  Filled 2015-09-17: qty 1

## 2015-09-17 MED ORDER — SODIUM CHLORIDE 0.9 % IV SOLN
1000.0000 mL | Freq: Once | INTRAVENOUS | Status: AC
  Administered 2015-09-17: 1000 mL via INTRAVENOUS

## 2015-09-17 NOTE — ED Notes (Signed)
Ems states pt started having chest pain,sob and dizziness 2hrs. Pt denies chest pain at the present time

## 2015-09-17 NOTE — ED Provider Notes (Signed)
CSN: [id number]     Arrival date & time 09/17/15  2240 History   First MD Initiated Contact with Patient 09/17/15 2251     Chief Complaint  Patient presents with  . Chest Pain     (Consider location/radiation/quality/duration/timing/severity/associated sxs/prior Treatment) Patient is a 70 y.o. male presenting with chest pain. The history is provided by the patient.  Chest Pain He started having chest pain about 1.5 hours ago. Pain is sharp and over the midsternal with some radiation to the left side of the chest. There is associated dyspnea but no nausea or diaphoresis. Pain has resolved spontaneously. He does relate that he had has been having problems with dizziness when he stands up. This has been going on for the last month and seems to be getting worse. Today, blood pressure at home was reported to have been as low as 50/20. He denies melena. He is also had a mild headache today and has noted some mild photophobia.  Past Medical History  Diagnosis Date  . Hypertension   . Diabetes mellitus without complication (HCC)   . CHF (congestive heart failure) (HCC)   . CAD (coronary artery disease)   . A-fib (HCC)   . Sleep apnea   . Chronic edema    Past Surgical History  Procedure Laterality Date  . Pacemaker insertion    . Cardiac catheterization Right 07/25/2015    Procedure: Left Heart Cath and Coronary Angiography;  Surgeon: Laurier NancyShaukat A Khan, MD;  Location: [facility] INVASIVE CV LAB;  Service: Cardiovascular;  Laterality: Right;   Family History  Problem Relation Age of Onset  . Diabetes Brother    Social History  Substance Use Topics  . Smoking status: Former Games developermoker  . Smokeless tobacco: None  . Alcohol Use: 0.0 oz/week    0 Standard drinks or equivalent per week    Review of Systems  Cardiovascular: Positive for chest pain.  All other systems reviewed and are negative.     Allergies  Sulfa antibiotics  Home Medications   Prior to Admission medications   Medication  Sig Start Date End Date Taking? Authorizing Provider  Ascorbic Acid (VITAMIN C) 1000 MG tablet Take 1,000 mg by mouth daily.    Historical Provider, MD  bumetanide (BUMEX) 1 MG tablet Take 3 tablets by mouth daily. Pt takes 2 tablets in the AM and 1 tablet in the afternoon as directed 06/26/15   Historical Provider, MD  carvedilol (COREG) 3.125 MG tablet Take 1 tablet by mouth 2 (two) times daily. 05/17/15   Historical Provider, MD  cholecalciferol (VITAMIN D) 400 units TABS tablet Take 2,000 Units by mouth.    Historical Provider, MD  citalopram (CELEXA) 20 MG tablet Take 20 mg by mouth daily. 06/07/15   Historical Provider, MD  clopidogrel (PLAVIX) 75 MG tablet Take 1 tablet (75 mg total) by mouth daily. 07/26/15   Katharina Caperima Vaickute, MD  ferrous sulfate 325 (65 FE) MG tablet Take 325 mg by mouth daily with breakfast.    Historical Provider, MD  gabapentin (NEURONTIN) 400 MG capsule Take 1 capsule by mouth 3 (three) times daily. 06/26/15   Historical Provider, MD  glimepiride (AMARYL) 2 MG tablet Take 2 mg by mouth daily with breakfast.    Historical Provider, MD  isosorbide mononitrate (IMDUR) 60 MG 24 hr tablet Take 60 mg by mouth daily.    Historical Provider, MD  levothyroxine (SYNTHROID, LEVOTHROID) 25 MCG tablet Take 25 mcg by mouth daily. 05/26/15   Historical Provider, MD  lisinopril (PRINIVIL,ZESTRIL) 5 MG tablet Take 1 tablet (5 mg total) by mouth daily. 07/26/15   Katharina Caper, MD  LORazepam (ATIVAN) 1 MG tablet Take 1 mg by mouth 2 (two) times daily. 04/18/15   Historical Provider, MD  nitroGLYCERIN (NITROSTAT) 0.4 MG SL tablet Place 0.4 mg under the tongue every 5 (five) minutes as needed for chest pain.    Historical Provider, MD  pantoprazole (PROTONIX) 40 MG tablet Take 40 mg by mouth daily. 05/02/15   Historical Provider, MD  potassium chloride SA (K-DUR,KLOR-CON) 20 MEQ tablet Take 60 mEq by mouth daily.    Historical Provider, MD  rOPINIRole (REQUIP) 2 MG tablet Take 1 tablet by mouth 3  (three) times daily. 06/11/15   Historical Provider, MD  spironolactone (ALDACTONE) 25 MG tablet Take 25 mg by mouth daily. 05/25/15   Historical Provider, MD  vitamin B-12 (CYANOCOBALAMIN) 1000 MCG tablet Take 1,000 mcg by mouth daily.    Historical Provider, MD   There were no vitals taken for this visit. Physical Exam  Nursing note and vitals reviewed.  Morbidly obese 70 year old male, resting comfortably and in no acute distress. Vital signs are significant for hypotension. Oxygen saturation is 97%, which is normal. Head is normocephalic and atraumatic. PERRLA, EOMI. Oropharynx is clear. Neck is nontender and supple without adenopathy or JVD. Back is nontender and there is no CVA tenderness. Lungs are clear without rales, wheezes, or rhonchi. Chest is nontender. Heart has regular rate and rhythm without murmur. Abdomen is soft, flat, nontender without masses or hepatosplenomegaly and peristalsis is normoactive. Extremities: Severe lymphedema present in both legs, with superimposed 2+ pitting edema. Skin is warm and dry without rash. Neurologic: Mental status is normal, cranial nerves are intact, there are no motor or sensory deficits.  ED Course  Procedures (including critical care time) Labs Review Results for orders placed or performed during the hospital encounter of 09/17/15  Troponin I  Result Value Ref Range   Troponin I <0.03 <0.031 ng/mL  CBC with Differential  Result Value Ref Range   WBC 5.9 4.0 - 10.5 K/uL   RBC 3.50 (L) 4.22 - 5.81 MIL/uL   Hemoglobin 10.7 (L) 13.0 - 17.0 g/dL   HCT 16.1 (L) 09.6 - 04.5 %   MCV 90.9 78.0 - 100.0 fL   MCH 30.6 26.0 - 34.0 pg   MCHC 33.6 30.0 - 36.0 g/dL   RDW 40.9 81.1 - 91.4 %   Platelets 120 (L) 150 - 400 K/uL   Neutrophils Relative % 73 %   Neutro Abs 4.3 1.7 - 7.7 K/uL   Lymphocytes Relative 15 %   Lymphs Abs 0.9 0.7 - 4.0 K/uL   Monocytes Relative 10 %   Monocytes Absolute 0.6 0.1 - 1.0 K/uL   Eosinophils Relative 2 %    Eosinophils Absolute 0.1 0.0 - 0.7 K/uL   Basophils Relative 0 %   Basophils Absolute 0.0 0.0 - 0.1 K/uL  D-dimer, quantitative  Result Value Ref Range   D-Dimer, Quant 0.37 0.00 - 0.50 ug/mL-FEU  Basic metabolic panel  Result Value Ref Range   Sodium 135 135 - 145 mmol/L   Potassium 4.9 3.5 - 5.1 mmol/L   Chloride 106 101 - 111 mmol/L   CO2 20 (L) 22 - 32 mmol/L   Glucose, Bld 139 (H) 65 - 99 mg/dL   BUN 91 (H) 6 - 20 mg/dL   Creatinine, Ser 7.82 (H) 0.61 - 1.24 mg/dL   Calcium 7.9 (L) 8.9 -  10.3 mg/dL   GFR calc non Af Amer 24 (L) >60 mL/min   GFR calc Af Amer 28 (L) >60 mL/min   Anion gap 9 5 - 15  Urinalysis, Routine w reflex microscopic  Result Value Ref Range   Color, Urine YELLOW YELLOW   APPearance CLEAR CLEAR   Specific Gravity, Urine 1.015 1.005 - 1.030   pH 5.0 5.0 - 8.0   Glucose, UA NEGATIVE NEGATIVE mg/dL   Hgb urine dipstick NEGATIVE NEGATIVE   Bilirubin Urine NEGATIVE NEGATIVE   Ketones, ur NEGATIVE NEGATIVE mg/dL   Protein, ur NEGATIVE NEGATIVE mg/dL   Nitrite NEGATIVE NEGATIVE   Leukocytes, UA NEGATIVE NEGATIVE  Troponin I (q 6hr x 3)  Result Value Ref Range   Troponin I <0.03 <0.031 ng/mL  I-Stat CG4 Lactic Acid, ED  Result Value Ref Range   Lactic Acid, Venous 1.23 0.5 - 2.0 mmol/L  POC occult blood, ED Provider will collect  Result Value Ref Range   Fecal Occult Bld NEGATIVE NEGATIVE  Type and screen [facility]  Result Value Ref Range   ABO/RH(D) A POS    Antibody Screen NEG    Sample Expiration 09/21/2015   ABO/Rh  Result Value Ref Range   ABO/RH(D) A POS    Imaging Review US Renal  09/18/2015  CLINICAL DATA:  Acute renal failure. History of hypertension, diabetes. EXAM: RENAL / URINARY TRACT ULTRASOUND COMPLETE COMPARISON:  None. FINDINGS: Right Kidney: Length: 12.7 cm. Echogenicity within normal limits. No mass or hydronephrosis visualized. Left Kidney: Length: 12.9 cm. Echogenicity within normal limits. No mass or  hydronephrosis visualized. Bladder: Appears normal for degree of bladder distention. IMPRESSION: Negative. Electronically Signed   By: Awilda Metro M.D.   On: 09/18/2015 06:20   Dg Chest Port 1 View  09/18/2015  CLINICAL DATA:  Acute onset of dizziness and shortness of breath. Initial encounter. EXAM: PORTABLE CHEST 1 VIEW COMPARISON:  None. FINDINGS: The lungs are well-aerated. Vascular congestion is noted. Increased interstitial markings raise concern for mild pulmonary edema. No pleural effusion or pneumothorax is seen. The cardiomediastinal silhouette is mildly enlarged. A pacemaker is noted overlying the left chest wall, with a single lead ending overlying the right ventricle. No acute osseous abnormalities are seen. IMPRESSION: Vascular congestion and mild cardiomegaly. Increased interstitial markings raise concern for mild pulmonary edema. Electronically Signed   By: Roanna Raider M.D.   On: 09/18/2015 03:12   I have personally reviewed and evaluated these images and lab results as part of my medical decision-making.   EKG Interpretation   Date/Time:  Saturday September 17 2015 22:50:04 EDT Ventricular Rate:  66 PR Interval:    QRS Duration: 186 QT Interval:  502 QTC Calculation: 526 R Axis:   -103 Text Interpretation:  Electronic ventricular pacemaker No old tracing to  compare Confirmed by [facility]  MD, Penny Arrambide ([id number]) on 09/17/2015 10:51:36 PM      CRITICAL CARE Performed by: UEAVW,[email or web address] Total critical care time: 50 minutes Critical care time was exclusive of separately billable procedures and treating other patients. Critical care was necessary to treat or prevent imminent or life-threatening deterioration. Critical care was time spent personally by me on the following activities: development of treatment plan with patient and/or surrogate as well as nursing, discussions with consultants, evaluation of patient's response to treatment, examination of patient, obtaining history from  patient or surrogate, ordering and performing treatments and interventions, ordering and review of laboratory studies, ordering and review of radiographic studies, pulse oximetry  and re-evaluation of patient's condition. MDM   Final diagnoses:  Hypotension, unspecified hypotension type  Acute renal failure, unspecified acute renal failure type (HCC)  Normochromic normocytic anemia  Thrombocytopenia (HCC)    Chest pain which has resolved. ECG shows 100% paced rhythm so unable to assess for ischemia. Old records are reviewed and he had a cardiac catheterization in April of this year which showed a 90% lesion of the RCA but with established collaterals. No other lesions greater than 50%. Progressive orthostatic hypotension of uncertain cause. Prior echocardiogram showed both systolic and diastolic dysfunction. He will be given IV fluids and screening labs obtained.  After IV fluids, blood pressure did not really improved. Laboratory workup showed a significant drop in hemoglobin to 10.7. Rectal exam was done showing brown stool which was Hemoccult negative. Metabolic panel shows evidence of acute renal failure with BUN of 91 and creatinine of 2.56. 2 months ago, these were normal. Case is discussed with Dr. Katrinka Blazing of [facility] to request urinalysis and a chest x-ray which were obtained and were not illuminating. However, after second liter of fluid, blood pressure did come up over 100. Of note, lactic acid and troponin were both normal. He'll be admitted for evaluation of the cause of the failure as well as drop in hemoglobin.  Dione Booze, MD 09/18/15 [phone]

## 2015-09-18 ENCOUNTER — Emergency Department (HOSPITAL_COMMUNITY): Payer: Medicare Other

## 2015-09-18 ENCOUNTER — Observation Stay (HOSPITAL_COMMUNITY): Payer: Medicare Other

## 2015-09-18 DIAGNOSIS — I890 Lymphedema, not elsewhere classified: Secondary | ICD-10-CM | POA: Diagnosis not present

## 2015-09-18 DIAGNOSIS — N179 Acute kidney failure, unspecified: Secondary | ICD-10-CM | POA: Diagnosis not present

## 2015-09-18 DIAGNOSIS — I10 Essential (primary) hypertension: Secondary | ICD-10-CM | POA: Diagnosis not present

## 2015-09-18 DIAGNOSIS — R079 Chest pain, unspecified: Secondary | ICD-10-CM

## 2015-09-18 DIAGNOSIS — D649 Anemia, unspecified: Secondary | ICD-10-CM | POA: Insufficient documentation

## 2015-09-18 DIAGNOSIS — I951 Orthostatic hypotension: Secondary | ICD-10-CM | POA: Diagnosis not present

## 2015-09-18 DIAGNOSIS — I959 Hypotension, unspecified: Secondary | ICD-10-CM | POA: Diagnosis not present

## 2015-09-18 DIAGNOSIS — R072 Precordial pain: Secondary | ICD-10-CM

## 2015-09-18 DIAGNOSIS — D696 Thrombocytopenia, unspecified: Secondary | ICD-10-CM | POA: Insufficient documentation

## 2015-09-18 LAB — CBC WITH DIFFERENTIAL/PLATELET
BASOS ABS: 0 10*3/uL (ref 0.0–0.1)
BASOS ABS: 0 10*3/uL (ref 0.0–0.1)
BASOS PCT: 0 %
BASOS PCT: 0 %
EOS ABS: 0.1 10*3/uL (ref 0.0–0.7)
EOS ABS: 0.1 10*3/uL (ref 0.0–0.7)
EOS PCT: 2 %
EOS PCT: 4 %
HCT: 31.8 % — ABNORMAL LOW (ref 39.0–52.0)
HCT: 35.5 % — ABNORMAL LOW (ref 39.0–52.0)
Hemoglobin: 10.7 g/dL — ABNORMAL LOW (ref 13.0–17.0)
Hemoglobin: 11.6 g/dL — ABNORMAL LOW (ref 13.0–17.0)
Lymphocytes Relative: 15 %
Lymphocytes Relative: 23 %
Lymphs Abs: 0.9 10*3/uL (ref 0.7–4.0)
Lymphs Abs: 0.9 10*3/uL (ref 0.7–4.0)
MCH: 30.6 pg (ref 26.0–34.0)
MCH: 29.4 pg (ref 26.0–34.0)
MCHC: 33.6 g/dL (ref 30.0–36.0)
MCHC: 32.7 g/dL (ref 30.0–36.0)
MCV: 90.9 fL (ref 78.0–100.0)
MCV: 90.1 fL (ref 78.0–100.0)
MONO ABS: 0.6 10*3/uL (ref 0.1–1.0)
MONO ABS: 0.5 10*3/uL (ref 0.1–1.0)
MONOS PCT: 10 %
MONOS PCT: 13 %
NEUTROS ABS: 4.3 10*3/uL (ref 1.7–7.7)
Neutro Abs: 2.4 10*3/uL (ref 1.7–7.7)
Neutrophils Relative %: 73 %
Neutrophils Relative %: 61 %
PLATELETS: 120 10*3/uL — AB (ref 150–400)
PLATELETS: 90 10*3/uL — AB (ref 150–400)
RBC: 3.5 MIL/uL — ABNORMAL LOW (ref 4.22–5.81)
RBC: 3.94 MIL/uL — ABNORMAL LOW (ref 4.22–5.81)
RDW: 13.1 % (ref 11.5–15.5)
RDW: 12.7 % (ref 11.5–15.5)
WBC: 5.9 10*3/uL (ref 4.0–10.5)
WBC: 3.9 10*3/uL — ABNORMAL LOW (ref 4.0–10.5)

## 2015-09-18 LAB — URINALYSIS, ROUTINE W REFLEX MICROSCOPIC
BILIRUBIN URINE: NEGATIVE
GLUCOSE, UA: NEGATIVE mg/dL
HGB URINE DIPSTICK: NEGATIVE
KETONES UR: NEGATIVE mg/dL
Leukocytes, UA: NEGATIVE
Nitrite: NEGATIVE
PH: 5 (ref 5.0–8.0)
Protein, ur: NEGATIVE mg/dL
Specific Gravity, Urine: 1.015 (ref 1.005–1.030)

## 2015-09-18 LAB — TYPE AND SCREEN
ABO/RH(D): A POS
ANTIBODY SCREEN: NEGATIVE

## 2015-09-18 LAB — GLUCOSE, CAPILLARY
GLUCOSE-CAPILLARY: 109 mg/dL — AB (ref 65–99)
Glucose-Capillary: 67 mg/dL (ref 65–99)
Glucose-Capillary: 77 mg/dL (ref 65–99)
Glucose-Capillary: 80 mg/dL (ref 65–99)

## 2015-09-18 LAB — POC OCCULT BLOOD, ED: Fecal Occult Bld: NEGATIVE

## 2015-09-18 LAB — BASIC METABOLIC PANEL
ANION GAP: 9 (ref 5–15)
BUN: 83 mg/dL — ABNORMAL HIGH (ref 6–20)
CALCIUM: 8 mg/dL — AB (ref 8.9–10.3)
CO2: 21 mmol/L — AB (ref 22–32)
CREATININE: 2.1 mg/dL — AB (ref 0.61–1.24)
Chloride: 109 mmol/L (ref 101–111)
GFR calc Af Amer: 35 mL/min — ABNORMAL LOW (ref >60)
GFR, EST NON AFRICAN AMERICAN: 30 mL/min — AB (ref >60)
GLUCOSE: 66 mg/dL (ref 65–99)
Potassium: 5 mmol/L (ref 3.5–5.1)
Sodium: 139 mmol/L (ref 135–145)

## 2015-09-18 LAB — TROPONIN I
Troponin I: <0.03 ng/mL (ref <0.031)
Troponin I: <0.03 ng/mL (ref <0.031)

## 2015-09-18 LAB — MRSA PCR SCREENING: MRSA BY PCR: NEGATIVE

## 2015-09-18 LAB — TSH: TSH: 3.772 u[IU]/mL (ref 0.350–4.500)

## 2015-09-18 LAB — ABO/RH: ABO/RH(D): A POS

## 2015-09-18 LAB — BRAIN NATRIURETIC PEPTIDE: B NATRIURETIC PEPTIDE 5: 223.5 pg/mL — AB (ref 0.0–100.0)

## 2015-09-18 MED ORDER — ALBUTEROL SULFATE (2.5 MG/3ML) 0.083% IN NEBU: 2.5000 mg | INHALATION_SOLUTION | RESPIRATORY_TRACT | Status: DC | PRN

## 2015-09-18 MED ORDER — CLOPIDOGREL BISULFATE 75 MG PO TABS
75.0000 mg | ORAL_TABLET | Freq: Every day | ORAL | Status: DC
  Administered 2015-09-18: 75 mg via ORAL
  Filled 2015-09-18: qty 1

## 2015-09-18 MED ORDER — SODIUM CHLORIDE 0.9 % IV BOLUS (SEPSIS)
500.0000 mL | Freq: Once | INTRAVENOUS | Status: AC
Start: 2015-09-18 — End: 2015-09-18
  Administered 2015-09-18: 500 mL via INTRAVENOUS

## 2015-09-18 MED ORDER — SODIUM CHLORIDE 0.9 % IV BOLUS (SEPSIS)
1000.0000 mL | Freq: Once | INTRAVENOUS | Status: AC
  Administered 2015-09-18: 1000 mL via INTRAVENOUS

## 2015-09-18 MED ORDER — VITAMIN D 1000 UNITS PO TABS
2000.0000 [IU] | ORAL_TABLET | Freq: Every day | ORAL | Status: DC
  Administered 2015-09-18: 2000 [IU] via ORAL
  Filled 2015-09-18: qty 2

## 2015-09-18 MED ORDER — ACETAMINOPHEN 650 MG RE SUPP: 650.0000 mg | Freq: Four times a day (QID) | RECTAL | Status: DC | PRN

## 2015-09-18 MED ORDER — ROPINIROLE HCL 1 MG PO TABS
2.0000 mg | ORAL_TABLET | Freq: Once | ORAL | Status: AC
  Administered 2015-09-18: 2 mg via ORAL
  Filled 2015-09-18: qty 2

## 2015-09-18 MED ORDER — SPIRONOLACTONE 25 MG PO TABS
25.0000 mg | ORAL_TABLET | Freq: Every day | ORAL | Status: DC
  Administered 2015-09-18: 25 mg via ORAL
  Filled 2015-09-18: qty 1

## 2015-09-18 MED ORDER — IPRATROPIUM BROMIDE 0.02 % IN SOLN
0.5000 mg | Freq: Four times a day (QID) | RESPIRATORY_TRACT | Status: DC
  Filled 2015-09-18: qty 2.5

## 2015-09-18 MED ORDER — SODIUM CHLORIDE 0.9 % IV SOLN
1000.0000 mL | INTRAVENOUS | Status: DC
  Administered 2015-09-18 – 2015-09-19 (×2): 1000 mL via INTRAVENOUS

## 2015-09-18 MED ORDER — IPRATROPIUM BROMIDE 0.02 % IN SOLN: 0.5000 mg | RESPIRATORY_TRACT | Status: DC | PRN

## 2015-09-18 MED ORDER — GABAPENTIN 100 MG PO CAPS
200.0000 mg | ORAL_CAPSULE | Freq: Once | ORAL | Status: AC
  Administered 2015-09-18: 200 mg via ORAL
  Filled 2015-09-18: qty 2

## 2015-09-18 MED ORDER — INSULIN ASPART 100 UNIT/ML ~~LOC~~ SOLN: 0.0000 [IU] | Freq: Three times a day (TID) | SUBCUTANEOUS | Status: DC

## 2015-09-18 MED ORDER — FERROUS SULFATE 325 (65 FE) MG PO TABS
325.0000 mg | ORAL_TABLET | Freq: Every day | ORAL | Status: DC
  Administered 2015-09-18: 325 mg via ORAL
  Filled 2015-09-18: qty 1

## 2015-09-18 MED ORDER — LORAZEPAM 1 MG PO TABS
1.0000 mg | ORAL_TABLET | Freq: Two times a day (BID) | ORAL | Status: DC
  Administered 2015-09-18 (×2): 1 mg via ORAL
  Filled 2015-09-18 (×2): qty 1

## 2015-09-18 MED ORDER — ISOSORBIDE MONONITRATE ER 60 MG PO TB24
60.0000 mg | ORAL_TABLET | Freq: Every day | ORAL | Status: DC
  Administered 2015-09-18: 60 mg via ORAL
  Filled 2015-09-18: qty 1

## 2015-09-18 MED ORDER — VITAMIN B-12 1000 MCG PO TABS
1000.0000 ug | ORAL_TABLET | Freq: Every day | ORAL | Status: DC
  Administered 2015-09-18: 1000 ug via ORAL
  Filled 2015-09-18: qty 1

## 2015-09-18 MED ORDER — LEVOTHYROXINE SODIUM 25 MCG PO TABS
25.0000 ug | ORAL_TABLET | Freq: Every day | ORAL | Status: DC
  Administered 2015-09-18 – 2015-09-19 (×2): 25 ug via ORAL
  Filled 2015-09-18 (×2): qty 1

## 2015-09-18 MED ORDER — ACETAMINOPHEN 325 MG PO TABS: 650.0000 mg | ORAL_TABLET | Freq: Four times a day (QID) | ORAL | Status: DC | PRN

## 2015-09-18 MED ORDER — SODIUM CHLORIDE 0.9% FLUSH
3.0000 mL | Freq: Two times a day (BID) | INTRAVENOUS | Status: DC
  Administered 2015-09-18 (×2): 3 mL via INTRAVENOUS

## 2015-09-18 MED ORDER — CITALOPRAM HYDROBROMIDE 10 MG PO TABS
20.0000 mg | ORAL_TABLET | Freq: Every day | ORAL | Status: DC
  Administered 2015-09-18: 20 mg via ORAL
  Filled 2015-09-18: qty 2

## 2015-09-18 MED ORDER — PANTOPRAZOLE SODIUM 40 MG PO TBEC
40.0000 mg | DELAYED_RELEASE_TABLET | Freq: Two times a day (BID) | ORAL | Status: DC
  Administered 2015-09-18 (×2): 40 mg via ORAL
  Filled 2015-09-18 (×2): qty 1

## 2015-09-18 NOTE — ED Notes (Signed)
Hospitalist at bedside to speak with and assess pt for admission.

## 2015-09-18 NOTE — ED Notes (Signed)
Lab at bedside to obtain ordered specimens.

## 2015-09-18 NOTE — Plan of Care (Signed)
Problem: Pain Managment: Goal: General experience of comfort will improve Outcome: Completed/Met Date Met:  09/18/15 Pt educated on pain scale and available interventions. Pt verbalized understanding. Will continue to monitor.

## 2015-09-18 NOTE — Consult Note (Signed)
CARDIOLOGY CONSULT NOTE       Patient ID: Cherlynn PerchesSamuel Offenberger MRN: [id number] DOB/AGE: 69/07/1945 69 y.o.  Admit date: 09/17/2015 Referring Physician: Jennette KettleNeal Primary Physician: [name], MD Primary Cardiologist: Lynn ItoKhan [place] Reason for Consultation: Chest Pain  Principal Problem:   Chest pain Active Problems:   Generalized weakness   Obstructive sleep apnea   Essential hypertension   Lymphedema   Hypotension   HPI:  70 y.o. history of CAD.  Presented  with complaints of chest pain and generalized weakness. Pain was described as midsternal in nature. He reports no recent new medication changes. However over the last month or so he's been having increased lightheadedness when standing but has not reported any episodes of passing out. At home patient was noted have a blood pressure as close 50/20. He reports that he is eating and drinking as normal and has not noticed any significant changes in his weight.  Just cathed by Dr Welton FlakesKhan 07/25/15.  Total RCA with right to right collaterals and 50% D1 and distal LAD. Patient compliant with meds. History of SSS with pacer And CHF. Echo 07/24/15 with EF 40% moderate MR and estimated PA pressure 58 mmHg. [facility]  Has hereditary chronic lymphedema That has worsened since having heart issues. Retired from [employer] Two living daughters and wife and home. Severe depression and is supposed to see a psychiatrist for first time on  Monday.  Has been on citalopram.       Labs showed profound pre-renal azotemia Patient indicates being on both bumex and aldactone   ROS All other systems reviewed and negative except as noted above  Past Medical History  Diagnosis Date  . Hypertension   . Diabetes mellitus without complication (HCC)   . CHF (congestive heart failure) (HCC)   . CAD (coronary artery disease)   . A-fib (HCC)   . Sleep apnea   . Chronic edema     Family History  Problem Relation  Age of Onset  . Diabetes Brother     Social History   Social History  . Marital Status: Married    Spouse Name: N/A  . Number of Children: N/A  . Years of Education: N/A   Occupational History  . retired    Social History Main Topics  . Smoking status: Former Games developermoker  . Smokeless tobacco: Not on file  . Alcohol Use: 0.0 oz/week    0 Standard drinks or equivalent per week  . Drug Use: No  . Sexual Activity: Not on file   Other Topics Concern  . Not on file   Social History Narrative    Past Surgical History  Procedure Laterality Date  . Pacemaker insertion    . Cardiac catheterization Right 07/25/2015    Procedure: Left Heart Cath and Coronary Angiography;  Surgeon: Laurier NancyShaukat A Khan, MD;  Location: [facility] INVASIVE CV LAB;  Service: Cardiovascular;  Laterality: Right;     . cholecalciferol  2,000 Units Oral Daily  . citalopram  20 mg Oral Daily  . clopidogrel  75 mg Oral Daily  . ferrous sulfate  325 mg Oral Q breakfast  . insulin aspart  0-9 Units Subcutaneous TID WC  . ipratropium  0.5 mg Nebulization Q6H  . isosorbide mononitrate  60 mg Oral Daily  . levothyroxine  25 mcg Oral QAC breakfast  . LORazepam  1 mg Oral BID  . pantoprazole  40 mg Oral BID  . sodium chloride flush  3 mL Intravenous Q12H  . spironolactone  25 mg Oral Daily  . vitamin B-12  1,000 mcg Oral Daily   . sodium chloride 1,000 mL (09/18/15 0647)    Physical Exam: Blood pressure 117/67, pulse 69, temperature 97.6 F (36.4 C), temperature source Oral, resp. rate 18, height  (1.854 m), weight 130.591 kg (287 lb 14.4 oz), SpO2 100 %.    Depressed but articulate Healthy:  appears stated age HEENT: normal Neck supple with no adenopathy JVP normal no bruits no thyromegaly Lungs clear with no wheezing and good diaphragmatic motion Heart:  S1/S2 no murmur, no rub, gallop or click pacer under left clavicle PMI normal Abdomen: benighn, BS positve, no tenderness, no AAA no bruit.  No HSM or  HJR Distal pulses intact with no bruits Plus 3 chronic LE lymphedema Neuro non-focal Skin warm and dry No muscular weakness   Labs:   Lab Results  Component Value Date   WBC 5.9 09/17/2015   HGB 10.7* 09/17/2015   HCT 31.8* 09/17/2015   MCV 90.9 09/17/2015   PLT 120* 09/17/2015    Recent Labs Lab 09/18/15 0725  NA 139  K 5.0  CL 109  CO2 21*  BUN 83*  CREATININE 2.10*  CALCIUM 8.0*  GLUCOSE 66   Lab Results  Component Value Date   CKTOTAL 212 07/26/2015   TROPONINI <0.03 09/18/2015   No results found for: CHOL No results found for: HDL No results found for: LDLCALC No results found for: TRIG No results found for: CHOLHDL No results found for: LDLDIRECT    Radiology: US Renal  09/18/2015  CLINICAL DATA:  Acute renal failure. History of hypertension, diabetes. EXAM: RENAL / URINARY TRACT ULTRASOUND COMPLETE COMPARISON:  None. FINDINGS: Right Kidney: Length: 12.7 cm. Echogenicity within normal limits. No mass or hydronephrosis visualized. Left Kidney: Length: 12.9 cm. Echogenicity within normal limits. No mass or hydronephrosis visualized. Bladder: Appears normal for degree of bladder distention. IMPRESSION: Negative. Electronically Signed   By: Awilda Metro M.D.   On: 09/18/2015 06:20   Dg Chest Port 1 View  09/18/2015  CLINICAL DATA:  Acute onset of dizziness and shortness of breath. Initial encounter. EXAM: PORTABLE CHEST 1 VIEW COMPARISON:  None. FINDINGS: The lungs are well-aerated. Vascular congestion is noted. Increased interstitial markings raise concern for mild pulmonary edema. No pleural effusion or pneumothorax is seen. The cardiomediastinal silhouette is mildly enlarged. A pacemaker is noted overlying the left chest wall, with a single lead ending overlying the right ventricle. No acute osseous abnormalities are seen. IMPRESSION: Vascular congestion and mild cardiomegaly. Increased interstitial markings raise concern for mild pulmonary edema.  Electronically Signed   By: Roanna Raider M.D.   On: 09/18/2015 03:12    EKG: V pacing with variable PR    ASSESSMENT AND PLAN:  Chest Pain: r/o recent cath with stable anatomy and collateralized total RCA. Continue plavix and nitrates CHF:  EF 40% LE edema more from lymphedema hold diuretics given azotemia. Suspect he should only be on low dose lasix on d/c  Lymphedema:  Chronic hereditary  Depression: continue citalapram pateint would like to be d/c by Monday am to keep psychiatry appt SSS:  Normal pacer function ? Variable PR interval on ECG and not chronic afib not on anticoagulation  Ok to d/c home once azotemia resolved with outpatient f/u Dr Welton Flakes in [facility] No indication for cath Or further cardiac testing  Signed: Charlton Haws 09/18/2015, 8:22 AM

## 2015-09-18 NOTE — ED Notes (Signed)
Patient transported to Ultrasound 

## 2015-09-18 NOTE — Progress Notes (Signed)
Pt was seen and examined.  H&P and consult notes reviewed.  Labs and imaging reviewed.    Maryln Manuel. Johnson, MD 09/18/2015  1:27 PM

## 2015-09-18 NOTE — H&P (Addendum)
History and Physical    Jesse [name] ZOX:[id number] DOB: [birth year] DOA: 09/17/2015  Referring MD/NP/PA: Dr. Preston Fleeting PCP: Pasty Spillers McLean-Scocozza, MD  Patient coming from: Home  Chief Complaint: Chest pain  HPI: Jesse [name] is a 70 y.o. male with medical history significant of HTN, systolic CHF( EF 81% with diffuse hypokinesis and grade 1 diastolic dysfunction in 07/2015), CAD, lymphedema, A. Fib s/p PM, OSA on CPAP; who presents with complaints of chest pain and generalized weakness. Pain was described as midsternal in nature. He reports no recent new medication changes. However over the last month or so he's been having increased lightheadedness when standing but has not reported any episodes of passing out. At home patient was noted have a blood pressure as close 50/20. He reports that he is eating and drinking as normal and has not noticed any significant changes in his weight.  ED Course: Upon admission into the emergency department patient was evaluated and seen have a blood pressure as low as 75/37. Lab work revealed hemoglobin 10.7, platelets 120, d-dimer 0.37, troponin <0.03, BUN 90, and creatinine 2.56.  Just last month of the patient's blood counts and creatinine were relatively within normal limits. Urinalysis showed no acute abnormalities. An chest x-ray showed some mild vascular congestion.  Review of Systems: As per HPI otherwise 10 point review of systems negative.   Past Medical History  Diagnosis Date  . Hypertension   . Diabetes mellitus without complication (HCC)   . CHF (congestive heart failure) (HCC)   . CAD (coronary artery disease)   . A-fib (HCC)   . Sleep apnea   . Chronic edema     Past Surgical History  Procedure Laterality Date  . Pacemaker insertion    . Cardiac catheterization Right 07/25/2015    Procedure: Left Heart Cath and Coronary Angiography;  Surgeon: Laurier Nancy, MD;  Location: [facility]IVE CV [facility];  Service: Cardiovascular;  Laterality: Right;      reports that he has quit smoking. He does not have any smokeless tobacco history on file. He reports that he drinks alcohol. He reports that he does not use illicit drugs.  Allergies  Allergen Reactions  . Sulfa Antibiotics Other (See Comments)    hallucinations     Family History  Problem Relation Age of Onset  . Diabetes Brother     Prior to Admission medications   Medication Sig Start Date End Date Taking? Authorizing Provider  Ascorbic Acid (VITAMIN C) 1000 MG tablet Take 1,000 mg by mouth daily.   Yes Historical Provider, MD  bumetanide (BUMEX) 1 MG tablet Take 1-2 tablets by mouth 2 (two) times daily. Pt takes 2 tablets in the AM and 1 tablet in the afternoon as directed 06/26/15  Yes Historical Provider, MD  carvedilol (COREG) 3.125 MG tablet Take 1 tablet by mouth 2 (two) times daily. 05/17/15  Yes Historical Provider, MD  cholecalciferol (VITAMIN D) 400 units TABS tablet Take 2,000 Units by mouth.   Yes Historical Provider, MD  citalopram (CELEXA) 20 MG tablet Take 20 mg by mouth daily. 06/07/15  Yes Historical Provider, MD  clopidogrel (PLAVIX) 75 MG tablet Take 1 tablet (75 mg total) by mouth daily. 07/26/15  Yes Katharina Caper, MD  ferrous sulfate 325 (65 FE) MG tablet Take 325 mg by mouth daily with breakfast.   Yes Historical Provider, MD  gabapentin (NEURONTIN) 400 MG capsule Take 1 capsule by mouth 3 (three) times daily. 06/26/15  Yes Historical Provider, MD  glimepiride (AMARYL) 2  MG tablet Take 2 mg by mouth daily with breakfast.   Yes Historical Provider, MD  isosorbide mononitrate (IMDUR) 60 MG 24 hr tablet Take 60 mg by mouth daily.   Yes Historical Provider, MD  levothyroxine (SYNTHROID, LEVOTHROID) 25 MCG tablet Take 25 mcg by mouth daily. 05/26/15  Yes Historical Provider, MD  lisinopril (PRINIVIL,ZESTRIL) 5 MG tablet Take 1 tablet (5 mg total) by mouth daily. 07/26/15  Yes Katharina Caper, MD  LORazepam (ATIVAN) 1 MG tablet Take 1 mg by mouth 2 (two) times daily.  04/18/15  Yes Historical Provider, MD  nitroGLYCERIN (NITROSTAT) 0.4 MG SL tablet Place 0.4 mg under the tongue every 5 (five) minutes as needed for chest pain.   Yes Historical Provider, MD  pantoprazole (PROTONIX) 40 MG tablet Take 40 mg by mouth daily. 05/02/15  Yes Historical Provider, MD  potassium chloride SA (K-DUR,KLOR-CON) 20 MEQ tablet Take 60 mEq by mouth daily.   Yes Historical Provider, MD  rOPINIRole (REQUIP) 2 MG tablet Take 1 tablet by mouth 3 (three) times daily. 06/11/15  Yes Historical Provider, MD  spironolactone (ALDACTONE) 25 MG tablet Take 25 mg by mouth daily. 05/25/15  Yes Historical Provider, MD  vitamin B-12 (CYANOCOBALAMIN) 1000 MCG tablet Take 1,000 mcg by mouth daily.   Yes Historical Provider, MD    Physical Exam: Filed Vitals:   09/18/15 0345 09/18/15 0400 09/18/15 0415 09/18/15 0445  BP: 86/52 118/69 89/62 108/65  Pulse: 68 66 66 67  Temp:      Resp: Height:      Weight:      SpO2: 100% 99% 100% 100%      Constitutional: Obese male in no acute distress resting with CPAP mass on. Filed Vitals:   09/18/15 0345 09/18/15 0400 09/18/15 0415 09/18/15 0445  BP: 86/52 118/69 89/62 108/65  Pulse: 68 66 66 67  Temp:      Resp: Height:      Weight:      SpO2: 100% 99% 100% 100%   Eyes: PERRL, lids and conjunctivae normal ENMT: Mucous membranes are moist. Posterior pharynx clear of any exudate or lesions.Normal dentition.  Neck: normal, supple, no masses, no thyromegaly Respiratory: Bilateral crackles appreciated. Normal respiratory effort. No accessory muscle use.  Cardiovascular: Regular rate and rhythm, no murmurs / rubs / gallops.Lymphedema present of the bilateral lower extremities 2+ pedal pulses. No carotid bruits.  Abdomen: no tenderness, no masses palpated. No hepatosplenomegaly. Bowel sounds positive.  Musculoskeletal: no clubbing / cyanosis. No joint deformity upper and lower extremities. Good ROM, no contractures. Normal  muscle tone.  Skin: no rashes, lesions, ulcers. No induration Neurologic: CN 2-12 grossly intact. Sensation intact, DTR normal. Strength 5/5 in all 4.  Psychiatric: Normal judgment and insight. Alert and oriented x 3. Normal mood.     Labs on Admission: I have personally reviewed following labs and imaging studies  CBC:  Recent Labs Lab 09/17/15 2313  WBC 5.9  NEUTROABS 4.3  HGB 10.7*  HCT 31.8*  MCV 90.9  PLT 120*   Basic Metabolic Panel:  Recent Labs Lab 09/17/15 2313  NA 135  K 4.9  CL 106  CO2 20*  GLUCOSE 139*  BUN 91*  CREATININE 2.56*  CALCIUM 7.9*   GFR: Estimated Creatinine Clearance: 37.5 mL/min (by C-G formula based on Cr of 2.56). Liver Function Tests: No results for input(s): AST, ALT, ALKPHOS, BILITOT, PROT, ALBUMIN in the last 168 hours. No results for  input(s): LIPASE, AMYLASE in the last 168 hours. No results for input(s): AMMONIA in the last 168 hours. Coagulation Profile: No results for input(s): INR, PROTIME in the last 168 hours. Cardiac Enzymes:  Recent Labs Lab 09/17/15 2313  TROPONINI <0.03   BNP (last 3 results) No results for input(s): PROBNP in the last 8760 hours. HbA1C: No results for input(s): HGBA1C in the last 72 hours. CBG: No results for input(s): GLUCAP in the last 168 hours. Lipid Profile: No results for input(s): CHOL, HDL, LDLCALC, TRIG, CHOLHDL, LDLDIRECT in the last 72 hours. Thyroid Function Tests: No results for input(s): TSH, T4TOTAL, FREET4, T3FREE, THYROIDAB in the last 72 hours. Anemia Panel: No results for input(s): VITAMINB12, FOLATE, FERRITIN, TIBC, IRON, RETICCTPCT in the last 72 hours. Urine analysis:    Component Value Date/Time   COLORURINE YELLOW 09/18/2015 0225   APPEARANCEUR CLEAR 09/18/2015 0225   LABSPEC 1.015 09/18/2015 0225   PHURINE 5.0 09/18/2015 0225   GLUCOSEU NEGATIVE 09/18/2015 0225   HGBUR NEGATIVE 09/18/2015 0225   BILIRUBINUR NEGATIVE 09/18/2015 0225   KETONESUR NEGATIVE  09/18/2015 0225   PROTEINUR NEGATIVE 09/18/2015 0225   NITRITE NEGATIVE 09/18/2015 0225   LEUKOCYTESUR NEGATIVE 09/18/2015 0225   Sepsis Labs: No results found for this or any previous visit (from the past 240 hour(s)).   Radiological Exams on Admission: Dg Chest Port 1 View  09/18/2015  CLINICAL DATA:  Acute onset of dizziness and shortness of breath. Initial encounter. EXAM: PORTABLE CHEST 1 VIEW COMPARISON:  None. FINDINGS: The lungs are well-aerated. Vascular congestion is noted. Increased interstitial markings raise concern for mild pulmonary edema. No pleural effusion or pneumothorax is seen. The cardiomediastinal silhouette is mildly enlarged. A pacemaker is noted overlying the left chest wall, with a single lead ending overlying the right ventricle. No acute osseous abnormalities are seen. IMPRESSION: Vascular congestion and mild cardiomegaly. Increased interstitial markings raise concern for mild pulmonary edema. Electronically Signed   By: Roanna Raider M.D.   On: 09/18/2015 03:12    EKG: Independently reviewed. EKG showing a ventricularly paced rhythm  Assessment/Plan Chest pain: Acute. Patient with a ventricularly paced rhythm on EKG troponins negative on initial eval. - Admit to stepdown - Trend cardiac enzymes - would Consult cardiology in a.m. for further recommendations  Hypotension with Hx of Essential hypertension: Acute. Blood pressures as low as - Hold blood pressure medications for now until blood pressures improved including Coreg, Bumex, lisinopril, - IV fluids as tolerated  Acute renal failure: Acute.  Patient with a elevated BUN of 90 and creatinine of 2.5. Patient's previous kidney function was within normal limits just two months ago. Suspect symptoms secondary to overdiuresis - Renal ultrasound - FeUr - Follow-up repeat bmp  Anemia: Hemoglobin previously at a baseline of 13.1 drop down to 10.7 on admission. Question acute blood loss anemia however guaiac  stool negative. - Follow-up repeat CBC  Systolic congestive heart failure: Last EF was noted to be 40% with grade 1 diastolic dysfunction back in 07/2015 - strict I&Os - Held Bumex and lisinopril secondary to acute renal failure  - Held Coreg secondary to - Continue isosorbide mononitrate if blood pressures will allow  Depression - Continue Celexa  Coronary artery disease - Continue Plavix  Diabetes mellitus type 2 - Held Amaryl  - CBGs every before meals and at bedtime  Hypothyroidism  - Check TSH - Continue levothyroxine   OSA on CPAP - Continue CPAP at night  Lymphedema: Chronic  GERD - Protonix  DVT prophylaxis: SCDs  Code Status: DO NOT RESUSCITATE Family Communication: None Disposition Plan: Possible discharge 2 to 3 days Consults called: None Admission status: Observation stepdown   Clydie Braunondell A Frankye Schwegel MD [facility] Pager [phone]  If 7PM-7AM, please contact night-coverage [email or web address] Password [email or web address]  09/18/2015, 5:02 AM

## 2015-09-18 NOTE — ED Notes (Signed)
Pt back from US

## 2015-09-18 NOTE — Progress Notes (Signed)
Placed himself on CPAP for the night

## 2015-09-19 ENCOUNTER — Ambulatory Visit: Payer: Medicare Other | Admitting: Psychiatry

## 2015-09-19 DIAGNOSIS — N179 Acute kidney failure, unspecified: Secondary | ICD-10-CM | POA: Diagnosis present

## 2015-09-19 DIAGNOSIS — R531 Weakness: Secondary | ICD-10-CM

## 2015-09-19 DIAGNOSIS — I952 Hypotension due to drugs: Secondary | ICD-10-CM | POA: Diagnosis not present

## 2015-09-19 DIAGNOSIS — I10 Essential (primary) hypertension: Secondary | ICD-10-CM

## 2015-09-19 DIAGNOSIS — G4733 Obstructive sleep apnea (adult) (pediatric): Secondary | ICD-10-CM

## 2015-09-19 DIAGNOSIS — R072 Precordial pain: Secondary | ICD-10-CM | POA: Diagnosis not present

## 2015-09-19 DIAGNOSIS — I959 Hypotension, unspecified: Secondary | ICD-10-CM | POA: Diagnosis present

## 2015-09-19 LAB — GLUCOSE, CAPILLARY: GLUCOSE-CAPILLARY: 98 mg/dL (ref 65–99)

## 2015-09-19 LAB — BASIC METABOLIC PANEL
ANION GAP: 5 (ref 5–15)
BUN: 53 mg/dL — ABNORMAL HIGH (ref 6–20)
CALCIUM: 8.4 mg/dL — AB (ref 8.9–10.3)
CO2: 25 mmol/L (ref 22–32)
CREATININE: 1.24 mg/dL (ref 0.61–1.24)
Chloride: 110 mmol/L (ref 101–111)
GFR, EST NON AFRICAN AMERICAN: 58 mL/min — AB (ref >60)
Glucose, Bld: 85 mg/dL (ref 65–99)
Potassium: 4.8 mmol/L (ref 3.5–5.1)
SODIUM: 140 mmol/L (ref 135–145)

## 2015-09-19 MED ORDER — POTASSIUM CHLORIDE CRYS ER 20 MEQ PO TBCR: 20.0000 meq | EXTENDED_RELEASE_TABLET | Freq: Every day | ORAL | Status: DC

## 2015-09-19 MED ORDER — BUMETANIDE 1 MG PO TABS: 1.0000 mg | ORAL_TABLET | Freq: Every day | ORAL | Status: DC

## 2015-09-19 NOTE — Progress Notes (Signed)
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

## 2015-09-19 NOTE — Discharge Instructions (Signed)
Please follow up with your cardiologist in next 3-5 days to review your heart medications.    Acute Kidney Injury Acute kidney injury is any condition in which there is sudden (acute) damage to the kidneys. Acute kidney injury was previously known as acute kidney failure or acute renal failure. The kidneys are two organs that lie on either side of the spine between the middle of the back and the front of the abdomen. The kidneys:  Remove wastes and extra water from the blood.   Produce important hormones. These help keep bones strong, regulate blood pressure, and help create red blood cells.   Balance the fluids and chemicals in the blood and tissues. A small amount of kidney damage may not cause problems, but a large amount of damage may make it difficult or impossible for the kidneys to work the way they should. Acute kidney injury may develop into long-lasting (chronic) kidney disease. It may also develop into a life-threatening disease called end-stage kidney disease. Acute kidney injury can get worse very quickly, so it should be treated right away. Early treatment may prevent other kidney diseases from developing. CAUSES   A problem with blood flow to the kidneys. This may be caused by:   Blood loss.   Heart disease.   Severe burns.   Liver disease.  Direct damage to the kidneys. This may be caused by:  Some medicines.   A kidney infection.   Poisoning or consuming toxic substances.   A surgical wound.   A blow to the kidney area.   A problem with urine flow. This may be caused by:   Cancer.   Kidney stones.   An enlarged prostate. SIGNS AND SYMPTOMS   Swelling (edema) of the legs, ankles, or feet.   Tiredness (lethargy).   Nausea or vomiting.   Confusion.   Problems with urination, such as:   Painful or burning feeling during urination.   Decreased urine production.   Frequent accidents in children who are potty trained.    Bloody urine.   Muscle twitches and cramps.   Shortness of breath.   Seizures.   Chest pain or pressure. Sometimes, no symptoms are present. DIAGNOSIS Acute kidney injury may be detected and diagnosed by tests, including blood, urine, imaging, or kidney biopsy tests.  TREATMENT Treatment of acute kidney injury varies depending on the cause and severity of the kidney damage. In mild cases, no treatment may be needed. The kidneys may heal on their own. If acute kidney injury is more severe, your health care provider will treat the cause of the kidney damage, help the kidneys heal, and prevent complications from occurring. Severe cases may require a procedure to remove toxic wastes from the body (dialysis) or surgery to repair kidney damage. Surgery may involve:   Repair of a torn kidney.   Removal of an obstruction. HOME CARE INSTRUCTIONS  Follow your prescribed diet.  Take medicines only as directed by your health care provider.  Do not take any new medicines (prescription, over-the-counter, or nutritional supplements) unless approved by your health care provider. Many medicines can worsen your kidney damage or may need to have the dose adjusted.   Keep all follow-up visits as directed by your health care provider. This is important.  Observe your condition to make sure you are healing as expected. SEEK IMMEDIATE MEDICAL CARE IF:  You are feeling ill or have severe pain in the back or side.   Your symptoms return or you have new  symptoms.  You have any symptoms of end-stage kidney disease. These include:   Persistent itchiness.   Loss of appetite.   Headaches.   Abnormally dark or light skin.  Numbness in the hands or feet.   Easy bruising.   Frequent hiccups.   Menstruation stops.   You have a fever.  You have increased urine production.  You have pain or bleeding when urinating. MAKE SURE YOU:   Understand these instructions.  Will  watch your condition.  Will get help right away if you are not doing well or get worse.   This information is not intended to replace advice given to you by your health care provider. Make sure you discuss any questions you have with your health care provider.   Document Released: 10/09/2010 Document Revised: 04/16/2014 Document Reviewed: 11/23/2011 Elsevier Interactive Patient Education 2016 [employer]   Hypotension As your heart beats, it forces blood through your body. This force is called blood pressure. If you have hypotension, you have low blood pressure. When your blood pressure is too low, you may not get enough blood to your brain. You may feel weak, feel lightheaded, have a fast heartbeat, or even pass out (faint). HOME CARE  Drink enough fluids to keep your pee (urine) clear or pale yellow.  Take all medicines as told by your doctor.  Get up slowly after sitting or lying down.  Wear support stockings as told by your doctor.  Maintain a healthy diet by including foods such as fruits, vegetables, nuts, whole grains, and lean meats. GET HELP IF:  You are throwing up (vomiting) or have watery poop (diarrhea).  You have a fever for more than 2-3 days.  You feel more thirsty than usual.  You feel weak and tired. GET HELP RIGHT AWAY IF:   You pass out (faint).  You have chest pain or a fast or irregular heartbeat.  You lose feeling in part of your body.  You cannot move your arms or legs.  You have trouble speaking.  You get sweaty or feel lightheaded. MAKE SURE YOU:   Understand these instructions.  Will watch your condition.  Will get help right away if you are not doing well or get worse.   This information is not intended to replace advice given to you by your health care provider. Make sure you discuss any questions you have with your health care provider.   Document Released: 06/20/2009 Document Revised: 11/26/2012 Document Reviewed:  09/26/2012 Elsevier Interactive Patient Education [employer]

## 2015-09-19 NOTE — Discharge Summary (Signed)
Physician Discharge Summary  Jesse [name] ZOX:[id number] DOB: [birth year] DOA: 09/17/2015  PCP: Pasty Spillers McLean-Scocozza, MD  Admit date: 09/17/2015 Discharge date: 09/19/2015  Admitted From: Home Disposition:  Home  Recommendations for Outpatient Follow-up:  1. Follow up with cardiologist in 1 week to review cardiac medications 2. Please obtain BMP/CBC in one week 3. Please monitor blood sugars closely  Discharge Condition: stable CODE STATUS:Full Diet recommendation: Heart Healthy   Brief/Interim Summary: Jesse [name] is a 70 y.o. male with medical history significant of HTN, systolic CHF( EF 81% with diffuse hypokinesis and grade 1 diastolic dysfunction in 07/2015), CAD, lymphedema, A. Fib s/p PM, OSA on CPAP; who presents with complaints of chest pain and generalized weakness. Pain was described as midsternal in nature. He reports no recent new medication changes. However over the last month or so he's been having increased lightheadedness when standing but has not reported any episodes of passing out. At home patient was noted have a blood pressure as close 50/20. He reports that he is eating and drinking as normal and has not noticed any significant changes in his weight.  ED Course: Upon admission into the emergency department patient was evaluated and seen have a blood pressure as low as 75/37. Lab work revealed hemoglobin 10.7, platelets 120, d-dimer 0.37, troponin <0.03, BUN 90, and creatinine 2.56. Just last month of the patient's blood counts and creatinine were relatively within normal limits. Urinalysis showed no acute abnormalities. An chest x-ray showed some mild vascular congestion.  Chest pain: Acute. Patient with a ventricularly paced rhythm on EKG troponins negative on initial eval. - cardiology was consulted and recommended no further interventions but for patient to follow up with Dr Welton Flakes on discharge for further recommendations  Hypotension with Hx of Essential  hypertension: Acute. Blood pressures as low as 50/20.  Improved with hydration - Hold blood pressure medications for now until blood pressures improved including Coreg, lisinopril,  Acute renal failure: Acute. Patient with a elevated BUN of 90 and creatinine of 2.5. Patient's previous kidney function was within normal limits just two months ago. Suspect symptoms secondary to overdiuresis - Renal ultrasound negative - Resolved to normal after IV hydration - Follow-up repeat bmp outpatient  Systolic congestive heart failure: Last EF was noted to be 40% with grade 1 diastolic dysfunction back in 07/2015 - Held Bumex and lisinopril secondary to acute renal failure. Sending home on low dose bumex, to follow up with Dr Welton Flakes to re-evaluate.   - Held Coreg secondary to severe hypotension, follow up with Dr. Welton Flakes regarding restarting - Continue isosorbide mononitrate  Depression - Continue Celexa - Follow up with psychiatry as scheduled  Coronary artery disease - Continue Plavix  Diabetes mellitus type 2 - Held Amaryl due to low blood sugars.  Re-evaluate with outpatient providers.    Hypothyroidism  - Continue levothyroxine   OSA on CPAP - Continue CPAP at night  Lymphedema: Chronic  GERD - Protonix   DVT prophylaxis: SCDs  Code Status: DO NOT RESUSCITATE  Discharge Diagnoses:  Principal Problem:   Chest pain Active Problems:   Generalized weakness   Obstructive sleep apnea   Essential hypertension   Lymphedema   Hypotension   ARF (acute renal failure) (HCC)   Arterial hypotension  Discharge Instructions      Discharge Instructions    Diet - low sodium heart healthy    Complete by:  As directed      Discharge instructions    Complete by:  As directed  Please see your cardiologist this week to review your cardiac medications.  Return if symptoms recur, worsen or new problems develop.     Increase activity slowly    Complete by:  As directed              Medication List    STOP taking these medications        carvedilol 3.125 MG tablet  Commonly known as:  COREG     glimepiride 2 MG tablet  Commonly known as:  AMARYL     lisinopril 5 MG tablet  Commonly known as:  PRINIVIL,ZESTRIL      TAKE these medications        bumetanide 1 MG tablet  Commonly known as:  BUMEX  Take 1 tablet (1 mg total) by mouth daily. Or as directed by cardiologist  Start taking on:  09/20/2015     cholecalciferol 400 units Tabs tablet  Commonly known as:  VITAMIN D  Take 2,000 Units by mouth.     citalopram 20 MG tablet  Commonly known as:  CELEXA  Take 20 mg by mouth daily.     clopidogrel 75 MG tablet  Commonly known as:  PLAVIX  Take 1 tablet (75 mg total) by mouth daily.     ferrous sulfate 325 (65 FE) MG tablet  Take 325 mg by mouth daily with breakfast.     gabapentin 400 MG capsule  Commonly known as:  NEURONTIN  Take 1 capsule by mouth 3 (three) times daily.     isosorbide mononitrate 60 MG 24 hr tablet  Commonly known as:  IMDUR  Take 60 mg by mouth daily.     levothyroxine 25 MCG tablet  Commonly known as:  SYNTHROID, LEVOTHROID  Take 25 mcg by mouth daily.     LORazepam 1 MG tablet  Commonly known as:  ATIVAN  Take 1 mg by mouth 2 (two) times daily.     nitroGLYCERIN 0.4 MG SL tablet  Commonly known as:  NITROSTAT  Place 0.4 mg under the tongue every 5 (five) minutes as needed for chest pain.     pantoprazole 40 MG tablet  Commonly known as:  PROTONIX  Take 40 mg by mouth daily.     potassium chloride SA 20 MEQ tablet  Commonly known as:  K-DUR,KLOR-CON  Take 1 tablet (20 mEq total) by mouth daily.     rOPINIRole 2 MG tablet  Commonly known as:  REQUIP  Take 1 tablet by mouth 3 (three) times daily.     spironolactone 25 MG tablet  Commonly known as:  ALDACTONE  Take 25 mg by mouth daily.     vitamin B-12 1000 MCG tablet  Commonly known as:  CYANOCOBALAMIN  Take 1,000 mcg by mouth daily.     vitamin C 1000  MG tablet  Take 1,000 mg by mouth daily.       Follow-up Information    [facility]. Schedule an appointment as soon as possible for a visit in 1 week.   Specialty:  Cardiology   Why:  hospital follow discuss cardiac medications   Contact information:   [address] [place] [address] [phone]      Allergies  Allergen Reactions  . Sulfa Antibiotics Other (See Comments)    hallucinations     Consultations:  Cardiology Nishan   Procedures/Studies: [facility] Renal  09/18/2015  CLINICAL DATA:  Acute renal failure. History of hypertension, diabetes. EXAM: RENAL / URINARY TRACT  ULTRASOUND COMPLETE COMPARISON:  None. FINDINGS: Right Kidney: Length: 12.7 cm. Echogenicity within normal limits. No mass or hydronephrosis visualized. Left Kidney: Length: 12.9 cm. Echogenicity within normal limits. No mass or hydronephrosis visualized. Bladder: Appears normal for degree of bladder distention. IMPRESSION: Negative. Electronically Signed   By: Awilda Metro M.D.   On: 09/18/2015 06:20   Dg Chest Port 1 View  09/18/2015  CLINICAL DATA:  Acute onset of dizziness and shortness of breath. Initial encounter. EXAM: PORTABLE CHEST 1 VIEW COMPARISON:  None. FINDINGS: The lungs are well-aerated. Vascular congestion is noted. Increased interstitial markings raise concern for mild pulmonary edema. No pleural effusion or pneumothorax is seen. The cardiomediastinal silhouette is mildly enlarged. A pacemaker is noted overlying the left chest wall, with a single lead ending overlying the right ventricle. No acute osseous abnormalities are seen. IMPRESSION: Vascular congestion and mild cardiomegaly. Increased interstitial markings raise concern for mild pulmonary edema. Electronically Signed   By: Roanna Raider M.D.   On: 09/18/2015 03:12    Subjective: Pt says he is feeling much better and insisting that he be discharged so that he can go to a scheduled psychiatry  appointment  Discharge Exam: Filed Vitals:   09/19/15 0000 09/19/15 0500  BP: 108/56 108/61  Pulse: 65 66  Temp: 97.8 F (36.6 C) 97.6 F (36.4 C)  Resp:     Filed Vitals:   09/18/15 1654 09/18/15 1951 09/19/15 0000 09/19/15 0500  BP: 109/60 116/68 108/56 108/61  Pulse: 79 67 65 66  Temp: 97.7 F (36.5 C) 97.7 F (36.5 C) 97.8 F (36.6 C) 97.6 F (36.4 C)  TempSrc: Oral Oral Axillary Oral  Resp:      Height:      Weight:    284 lb 3.2 oz (128.912 kg)  SpO2: 100% 100% 99% 98%    General: Pt is alert, awake, not in acute distress Cardiovascular: RRR, S1/S2 +, no rubs, no gallops Respiratory: CTA bilaterally, no wheezing, no rhonchi Abdominal: Soft, NT, ND, bowel sounds + Extremities: severe bilateral chronic lymphedema    The results of significant diagnostics from this hospitalization (including imaging, microbiology, ancillary and laboratory) are listed below for reference.     Microbiology: Recent Results (from the past 240 hour(s))  MRSA PCR Screening     Status: None   Collection Time: 09/18/15  8:03 AM  Result Value Ref Range Status   MRSA by PCR NEGATIVE NEGATIVE Final    Comment:        The GeneXpert MRSA Assay (FDA approved for NASAL specimens only), is one component of a comprehensive MRSA colonization surveillance program. It is not intended to diagnose MRSA infection nor to guide or monitor treatment for MRSA infections.      Labs: BNP (last 3 results)  Recent Labs  09/18/15 0725  BNP 223.5*   Basic Metabolic Panel:  Recent Labs Lab 09/17/15 2313 09/18/15 0725 09/19/15 0504  NA 135 139 140  K 4.9 5.0 4.8  CL 106 109 110  CO2 20* 21* 25  GLUCOSE 139* 66 85  BUN 91* 83* 53*  CREATININE 2.56* 2.10* 1.24  CALCIUM 7.9* 8.0* 8.4*   Liver Function Tests: No results for input(s): AST, ALT, ALKPHOS, BILITOT, PROT, ALBUMIN in the last 168 hours. No results for input(s): LIPASE, AMYLASE in the last 168 hours. No results for input(s):  AMMONIA in the last 168 hours. CBC:  Recent Labs Lab 09/17/15 2313 09/18/15 0725  WBC 5.9 3.9*  NEUTROABS 4.3 2.4  HGB 10.7* 11.6*  HCT 31.8* 35.5*  MCV 90.9 90.1  PLT 120* 90*   Cardiac Enzymes:  Recent Labs Lab 09/17/15 2313 09/18/15 0543 09/18/15 0829 09/18/15 1144  TROPONINI <0.03 <0.03 <0.03 <0.03   BNP: Invalid input(s): POCBNP CBG:  Recent Labs Lab 09/18/15 0818 09/18/15 1139 09/18/15 1652 09/18/15 2130  GLUCAP 67 77 109* 80   D-Dimer  Recent Labs  09/17/15 2313  DDIMER 0.37   Hgb A1c No results for input(s): HGBA1C in the last 72 hours. Lipid Profile No results for input(s): CHOL, HDL, LDLCALC, TRIG, CHOLHDL, LDLDIRECT in the last 72 hours. Thyroid function studies  Recent Labs  09/18/15 0725  TSH 3.772   Anemia work up No results for input(s): VITAMINB12, FOLATE, FERRITIN, TIBC, IRON, RETICCTPCT in the last 72 hours. Urinalysis    Component Value Date/Time   COLORURINE YELLOW 09/18/2015 0225   APPEARANCEUR CLEAR 09/18/2015 0225   LABSPEC 1.015 09/18/2015 0225   PHURINE 5.0 09/18/2015 0225   GLUCOSEU NEGATIVE 09/18/2015 0225   HGBUR NEGATIVE 09/18/2015 0225   BILIRUBINUR NEGATIVE 09/18/2015 0225   KETONESUR NEGATIVE 09/18/2015 0225   PROTEINUR NEGATIVE 09/18/2015 0225   NITRITE NEGATIVE 09/18/2015 0225   LEUKOCYTESUR NEGATIVE 09/18/2015 0225   Sepsis Labs Invalid input(s): PROCALCITONIN,  WBC,  LACTICIDVEN Microbiology Recent Results (from the past 240 hour(s))  MRSA PCR Screening     Status: None   Collection Time: 09/18/15  8:03 AM  Result Value Ref Range Status   MRSA by PCR NEGATIVE NEGATIVE Final    Comment:        The GeneXpert MRSA Assay (FDA approved for NASAL specimens only), is one component of a comprehensive MRSA colonization surveillance program. It is not intended to diagnose MRSA infection nor to guide or monitor treatment for MRSA infections.    Time coordinating discharge: Over 37  minutes  SIGNED:   Standley Dakins, MD  [facility] 09/19/2015, 7:24 AM Pager   If 7PM-7AM, please contact night-coverage [email or web address] Password [email or web address]

## 2015-09-19 NOTE — Progress Notes (Signed)
Report received via Nutritional therapistWhitney RN in patient's room using SBAR format, reviewed orders, labs,VS and patient's general condition, assumed care of patient.

## 2015-09-27 ENCOUNTER — Other Ambulatory Visit: Payer: Self-pay | Admitting: Specialist

## 2015-09-29 ENCOUNTER — Other Ambulatory Visit: Payer: Self-pay

## 2015-09-29 ENCOUNTER — Inpatient Hospital Stay
Admission: EM | Admit: 2015-09-29 | Discharge: 2015-10-01 | DRG: 918 | Disposition: A | Discharge status: Other Acute Inpatient Hospital | Payer: Medicare Other | Attending: Internal Medicine | Admitting: Internal Medicine

## 2015-09-29 DIAGNOSIS — I2510 Atherosclerotic heart disease of native coronary artery without angina pectoris: Secondary | ICD-10-CM | POA: Diagnosis present

## 2015-09-29 DIAGNOSIS — I447 Left bundle-branch block, unspecified: Secondary | ICD-10-CM | POA: Diagnosis present

## 2015-09-29 DIAGNOSIS — T43222A Poisoning by selective serotonin reuptake inhibitors, intentional self-harm, initial encounter: Secondary | ICD-10-CM | POA: Diagnosis present

## 2015-09-29 DIAGNOSIS — I959 Hypotension, unspecified: Secondary | ICD-10-CM | POA: Diagnosis present

## 2015-09-29 DIAGNOSIS — G2581 Restless legs syndrome: Secondary | ICD-10-CM | POA: Diagnosis present

## 2015-09-29 DIAGNOSIS — E119 Type 2 diabetes mellitus without complications: Secondary | ICD-10-CM | POA: Diagnosis present

## 2015-09-29 DIAGNOSIS — Z87891 Personal history of nicotine dependence: Secondary | ICD-10-CM

## 2015-09-29 DIAGNOSIS — I509 Heart failure, unspecified: Secondary | ICD-10-CM | POA: Diagnosis present

## 2015-09-29 DIAGNOSIS — I10 Essential (primary) hypertension: Secondary | ICD-10-CM | POA: Diagnosis present

## 2015-09-29 DIAGNOSIS — G4733 Obstructive sleep apnea (adult) (pediatric): Secondary | ICD-10-CM | POA: Diagnosis present

## 2015-09-29 DIAGNOSIS — I110 Hypertensive heart disease with heart failure: Secondary | ICD-10-CM | POA: Diagnosis present

## 2015-09-29 DIAGNOSIS — I890 Lymphedema, not elsewhere classified: Secondary | ICD-10-CM

## 2015-09-29 DIAGNOSIS — Z79899 Other long term (current) drug therapy: Secondary | ICD-10-CM | POA: Diagnosis not present

## 2015-09-29 DIAGNOSIS — F329 Major depressive disorder, single episode, unspecified: Secondary | ICD-10-CM | POA: Diagnosis present

## 2015-09-29 DIAGNOSIS — Z7902 Long term (current) use of antithrombotics/antiplatelets: Secondary | ICD-10-CM

## 2015-09-29 DIAGNOSIS — T50901A Poisoning by unspecified drugs, medicaments and biological substances, accidental (unintentional), initial encounter: Secondary | ICD-10-CM | POA: Diagnosis present

## 2015-09-29 DIAGNOSIS — F322 Major depressive disorder, single episode, severe without psychotic features: Secondary | ICD-10-CM | POA: Diagnosis present

## 2015-09-29 DIAGNOSIS — I5022 Chronic systolic (congestive) heart failure: Secondary | ICD-10-CM | POA: Diagnosis present

## 2015-09-29 DIAGNOSIS — T50902A Poisoning by unspecified drugs, medicaments and biological substances, intentional self-harm, initial encounter: Secondary | ICD-10-CM

## 2015-09-29 DIAGNOSIS — I480 Paroxysmal atrial fibrillation: Secondary | ICD-10-CM | POA: Diagnosis present

## 2015-09-29 DIAGNOSIS — Z950 Presence of cardiac pacemaker: Secondary | ICD-10-CM

## 2015-09-29 DIAGNOSIS — T1491XA Suicide attempt, initial encounter: Secondary | ICD-10-CM

## 2015-09-29 LAB — URINE DRUG SCREEN, QUALITATIVE (ARMC ONLY)
AMPHETAMINES, UR SCREEN: NOT DETECTED
Barbiturates, Ur Screen: NOT DETECTED
Benzodiazepine, Ur Scrn: POSITIVE — AB
COCAINE METABOLITE, UR ~~LOC~~: NOT DETECTED
Cannabinoid 50 Ng, Ur ~~LOC~~: NOT DETECTED
MDMA (ECSTASY) UR SCREEN: NOT DETECTED
Methadone Scn, Ur: NOT DETECTED
OPIATE, UR SCREEN: NOT DETECTED
PHENCYCLIDINE (PCP) UR S: NOT DETECTED
Tricyclic, Ur Screen: NOT DETECTED

## 2015-09-29 LAB — CBC
HEMATOCRIT: 35.9 % — AB (ref 40.0–52.0)
Hemoglobin: 12.6 g/dL — ABNORMAL LOW (ref 13.0–18.0)
MCH: 31.4 pg (ref 26.0–34.0)
MCHC: 35.2 g/dL (ref 32.0–36.0)
MCV: 89.3 fL (ref 80.0–100.0)
Platelets: 158 10*3/uL (ref 150–440)
RBC: 4.02 MIL/uL — AB (ref 4.40–5.90)
RDW: 13.7 % (ref 11.5–14.5)
WBC: 11.1 10*3/uL — AB (ref 3.8–10.6)

## 2015-09-29 LAB — MAGNESIUM: MAGNESIUM: 2.1 mg/dL (ref 1.7–2.4)

## 2015-09-29 LAB — BASIC METABOLIC PANEL
ANION GAP: 6 (ref 5–15)
BUN: 39 mg/dL — ABNORMAL HIGH (ref 6–20)
CHLORIDE: 103 mmol/L (ref 101–111)
CO2: 26 mmol/L (ref 22–32)
CREATININE: 1.33 mg/dL — AB (ref 0.61–1.24)
Calcium: 8.4 mg/dL — ABNORMAL LOW (ref 8.9–10.3)
GFR calc non Af Amer: 53 mL/min — ABNORMAL LOW (ref >60)
Glucose, Bld: 100 mg/dL — ABNORMAL HIGH (ref 65–99)
POTASSIUM: 5.4 mmol/L — AB (ref 3.5–5.1)
SODIUM: 135 mmol/L (ref 135–145)

## 2015-09-29 LAB — LIPID PANEL
CHOL/HDL RATIO: 2.5 ratio
CHOLESTEROL: 98 mg/dL (ref 0–200)
HDL: 40 mg/dL — ABNORMAL LOW (ref >40)
LDL Cholesterol: 27 mg/dL (ref 0–99)
Triglycerides: 153 mg/dL — ABNORMAL HIGH (ref <150)
VLDL: 31 mg/dL (ref 0–40)

## 2015-09-29 LAB — TROPONIN I
TROPONIN I: 0.07 ng/mL — AB (ref <0.031)
Troponin I: 0.03 ng/mL (ref <0.031)
Troponin I: 0.06 ng/mL — ABNORMAL HIGH (ref <0.031)
Troponin I: 0.04 ng/mL — ABNORMAL HIGH (ref <0.031)

## 2015-09-29 LAB — ACETAMINOPHEN LEVEL

## 2015-09-29 LAB — COMPREHENSIVE METABOLIC PANEL
ALT: 21 U/L (ref 17–63)
AST: 26 U/L (ref 15–41)
Albumin: 4.2 g/dL (ref 3.5–5.0)
Alkaline Phosphatase: 71 U/L (ref 38–126)
Anion gap: 9 (ref 5–15)
BUN: 41 mg/dL — AB (ref 6–20)
CHLORIDE: 105 mmol/L (ref 101–111)
CO2: 23 mmol/L (ref 22–32)
Calcium: 8.6 mg/dL — ABNORMAL LOW (ref 8.9–10.3)
Creatinine, Ser: 1.22 mg/dL (ref 0.61–1.24)
GFR, EST NON AFRICAN AMERICAN: 59 mL/min — AB (ref >60)
Glucose, Bld: 100 mg/dL — ABNORMAL HIGH (ref 65–99)
POTASSIUM: 5.5 mmol/L — AB (ref 3.5–5.1)
SODIUM: 137 mmol/L (ref 135–145)
Total Bilirubin: 0.8 mg/dL (ref 0.3–1.2)
Total Protein: 6.9 g/dL (ref 6.5–8.1)

## 2015-09-29 LAB — MRSA PCR SCREENING: MRSA BY PCR: NEGATIVE

## 2015-09-29 LAB — SALICYLATE LEVEL

## 2015-09-29 LAB — ETHANOL: Alcohol, Ethyl (B): <5 mg/dL (ref <5)

## 2015-09-29 LAB — POTASSIUM: POTASSIUM: 4.9 mmol/L (ref 3.5–5.1)

## 2015-09-29 MED ORDER — ACETAMINOPHEN 325 MG PO TABS: 650.0000 mg | ORAL_TABLET | Freq: Four times a day (QID) | ORAL | Status: DC | PRN

## 2015-09-29 MED ORDER — SENNOSIDES-DOCUSATE SODIUM 8.6-50 MG PO TABS: 1.0000 | ORAL_TABLET | Freq: Every evening | ORAL | Status: DC | PRN

## 2015-09-29 MED ORDER — CLOPIDOGREL BISULFATE 75 MG PO TABS
75.0000 mg | ORAL_TABLET | Freq: Every day | ORAL | Status: DC
  Administered 2015-09-29 – 2015-09-30 (×2): 75 mg via ORAL
  Filled 2015-09-29 (×2): qty 1

## 2015-09-29 MED ORDER — SODIUM POLYSTYRENE SULFONATE 15 GM/60ML PO SUSP
30.0000 g | Freq: Once | ORAL | Status: AC
  Administered 2015-09-29: 30 g via ORAL
  Filled 2015-09-29: qty 120

## 2015-09-29 MED ORDER — CHARCOAL ACTIVATED PO LIQD
ORAL | Status: AC
  Administered 2015-09-29: 100 g via ORAL
  Filled 2015-09-29: qty 480

## 2015-09-29 MED ORDER — LEVOTHYROXINE SODIUM 25 MCG PO TABS
25.0000 ug | ORAL_TABLET | Freq: Every day | ORAL | Status: DC
  Administered 2015-09-29 – 2015-09-30 (×2): 25 ug via ORAL
  Filled 2015-09-29 (×2): qty 1

## 2015-09-29 MED ORDER — POTASSIUM CHLORIDE CRYS ER 20 MEQ PO TBCR: 20.0000 meq | EXTENDED_RELEASE_TABLET | Freq: Every day | ORAL | Status: DC

## 2015-09-29 MED ORDER — CARVEDILOL 3.125 MG PO TABS
3.1250 mg | ORAL_TABLET | Freq: Two times a day (BID) | ORAL | Status: DC
  Administered 2015-09-29 – 2015-09-30 (×3): 3.125 mg via ORAL
  Filled 2015-09-29 (×3): qty 1

## 2015-09-29 MED ORDER — ISOSORBIDE MONONITRATE ER 60 MG PO TB24
60.0000 mg | ORAL_TABLET | Freq: Every day | ORAL | Status: DC
  Administered 2015-09-29 – 2015-09-30 (×2): 60 mg via ORAL
  Filled 2015-09-29 (×2): qty 1

## 2015-09-29 MED ORDER — LORAZEPAM 0.5 MG PO TABS
1.0000 mg | ORAL_TABLET | Freq: Two times a day (BID) | ORAL | Status: DC
  Administered 2015-09-29 – 2015-09-30 (×4): 1 mg via ORAL
  Filled 2015-09-29 (×4): qty 2

## 2015-09-29 MED ORDER — ACETAMINOPHEN 650 MG RE SUPP: 650.0000 mg | Freq: Four times a day (QID) | RECTAL | Status: DC | PRN

## 2015-09-29 MED ORDER — ENOXAPARIN SODIUM 40 MG/0.4ML ~~LOC~~ SOLN
40.0000 mg | SUBCUTANEOUS | Status: DC
  Administered 2015-09-29 – 2015-09-30 (×2): 40 mg via SUBCUTANEOUS
  Filled 2015-09-29 (×2): qty 0.4

## 2015-09-29 MED ORDER — PANTOPRAZOLE SODIUM 40 MG PO TBEC
40.0000 mg | DELAYED_RELEASE_TABLET | Freq: Every day | ORAL | Status: DC
  Administered 2015-09-29 – 2015-09-30 (×2): 40 mg via ORAL
  Filled 2015-09-29 (×2): qty 1

## 2015-09-29 MED ORDER — SODIUM CHLORIDE 0.9% FLUSH
3.0000 mL | Freq: Two times a day (BID) | INTRAVENOUS | Status: DC
  Administered 2015-09-29 – 2015-09-30 (×4): 3 mL via INTRAVENOUS

## 2015-09-29 MED ORDER — SPIRONOLACTONE 25 MG PO TABS
25.0000 mg | ORAL_TABLET | Freq: Every day | ORAL | Status: DC
  Administered 2015-09-29: 25 mg via ORAL
  Filled 2015-09-29: qty 1

## 2015-09-29 MED ORDER — GABAPENTIN 400 MG PO CAPS
400.0000 mg | ORAL_CAPSULE | Freq: Three times a day (TID) | ORAL | Status: DC
  Administered 2015-09-29 – 2015-09-30 (×6): 400 mg via ORAL
  Filled 2015-09-29 (×8): qty 1

## 2015-09-29 MED ORDER — CHARCOAL ACTIVATED PO LIQD
100.0000 g | Freq: Once | ORAL | Status: AC
  Administered 2015-09-29: 100 g via ORAL

## 2015-09-29 MED ORDER — NITROGLYCERIN 0.4 MG SL SUBL: 0.4000 mg | SUBLINGUAL_TABLET | SUBLINGUAL | Status: DC | PRN

## 2015-09-29 MED ORDER — ROPINIROLE HCL 1 MG PO TABS
2.0000 mg | ORAL_TABLET | Freq: Three times a day (TID) | ORAL | Status: DC
  Administered 2015-09-29 – 2015-09-30 (×6): 2 mg via ORAL
  Filled 2015-09-29 (×6): qty 2

## 2015-09-29 NOTE — ED Notes (Signed)
Pt stated that this is not his first SI attempt. Pt stated to this RN that he took 30-60 Ativan pills in April.

## 2015-09-29 NOTE — ED Notes (Signed)
Pt's R hand IV was halfway pulled out of R hand. This RN replaced tegaderm and tape and wrapped IV in gauze in attempt to keep pt from ripping IV out from pt's restlessness.

## 2015-09-29 NOTE — Care Management (Signed)
Patient is not active with [facility]. He never followed up as needed with his PCP. Admitted following ingestion of approximately 90 celexa. He has a hx of suicide attempts, most recent in April when he ingested 30-50 Ativan. Involuntary commitment. Psych to follow for inpatient psych admission when medically stable. He is normally ambulatory, independent and drives.

## 2015-09-29 NOTE — Progress Notes (Signed)
Mentioned to provider during rounds that poison control recommended trending magnesium and potassium lab values, see new orders.  Will continue to monitor.

## 2015-09-29 NOTE — Progress Notes (Signed)
Pastoral Care and prayer provided for patient. °

## 2015-09-29 NOTE — ED Notes (Signed)
Rolling and thrashing in bed, pt asking for "medication for his nerves", pt awake and alert, assisted to sit on side of bed for a moment, pt on cardiac monitor

## 2015-09-29 NOTE — Progress Notes (Signed)
Spoke with MD Sudini, notified of troponin result of 0.07, will continue to monitor patient.

## 2015-09-29 NOTE — ED Provider Notes (Signed)
[facility] Emergency Department Provider Note  ____________________________________________  Time seen: 3:45 AM  I have reviewed the triage vital signs and the nursing notes.   HISTORY  Chief Complaint Drug Overdose     HPI Jesse [name] is a 70 y.o. male presents via EMS status post intentional medication overdose suicide attempt. Patient admits to taking approximately 83 Celexa 20 mg tablets approximately one hour ago. Patient requesting something for his nerves" presentation to the emergency department stating that he feels very anxious hot and sweaty.     Past Medical History  Diagnosis Date  . Hypertension   . Diabetes mellitus without complication (HCC)   . CHF (congestive heart failure) (HCC)   . CAD (coronary artery disease)   . A-fib (HCC)   . Sleep apnea   . Chronic edema     Patient Active Problem List   Diagnosis Date Noted  . Arterial hypotension 09/19/2015  . Chest pain 09/18/2015  . Hypotension 09/18/2015  . ARF (acute renal failure) (HCC)   . Normochromic normocytic anemia   . Thrombocytopenia (HCC)   . Generalized weakness 07/26/2015  . Elevated troponin 07/26/2015  . S/P cardiac catheterization 07/26/2015  . Encephalopathy, metabolic 07/26/2015  . Obstructive sleep apnea 07/26/2015  . Restless leg syndrome 07/26/2015  . Essential hypertension 07/26/2015  . Lymphedema 07/26/2015  . Obesity 07/26/2015  . Rhabdomyolysis 07/24/2015  . Fall 07/24/2015    Past Surgical History  Procedure Laterality Date  . Pacemaker insertion    . Cardiac catheterization Right 07/25/2015    Procedure: Left Heart Cath and Coronary Angiography;  Surgeon: Laurier Nancy, MD;  Location: [facility] INVASIVE CV LAB;  Service: Cardiovascular;  Laterality: Right;    Current Outpatient Rx  Name  Route  Sig  Dispense  Refill  . Ascorbic Acid (VITAMIN C) 1000 MG tablet   Oral   Take 1,000 mg by mouth daily.         . bumetanide (BUMEX) 1 MG  tablet   Oral   Take 1 tablet (1 mg total) by mouth daily. Or as directed by cardiologist      0   . cholecalciferol (VITAMIN D) 400 units TABS tablet   Oral   Take 2,000 Units by mouth.         . citalopram (CELEXA) 20 MG tablet   Oral   Take 20 mg by mouth daily.      0   . clopidogrel (PLAVIX) 75 MG tablet   Oral   Take 1 tablet (75 mg total) by mouth daily.   30 tablet   6   . ferrous sulfate 325 (65 FE) MG tablet   Oral   Take 325 mg by mouth daily with breakfast.         . gabapentin (NEURONTIN) 400 MG capsule   Oral   Take 1 capsule by mouth 3 (three) times daily.      0   . isosorbide mononitrate (IMDUR) 60 MG 24 hr tablet   Oral   Take 60 mg by mouth daily.         Marland Kitchen levothyroxine (SYNTHROID, LEVOTHROID) 25 MCG tablet   Oral   Take 25 mcg by mouth daily.      0   . LORazepam (ATIVAN) 1 MG tablet   Oral   Take 1 mg by mouth 2 (two) times daily.      0   . nitroGLYCERIN (NITROSTAT) 0.4 MG SL tablet   Sublingual  Place 0.4 mg under the tongue every 5 (five) minutes as needed for chest pain.         . pantoprazole (PROTONIX) 40 MG tablet   Oral   Take 40 mg by mouth daily.      0   . potassium chloride SA (K-DUR,KLOR-CON) 20 MEQ tablet   Oral   Take 1 tablet (20 mEq total) by mouth daily.         Marland Kitchen. rOPINIRole (REQUIP) 2 MG tablet   Oral   Take 1 tablet by mouth 3 (three) times daily.      0   . spironolactone (ALDACTONE) 25 MG tablet   Oral   Take 25 mg by mouth daily.      0   . vitamin B-12 (CYANOCOBALAMIN) 1000 MCG tablet   Oral   Take 1,000 mcg by mouth daily.           Allergies Sulfa antibiotics and Latex  Family History  Problem Relation Age of Onset  . Diabetes Brother     Social History Social History  Substance Use Topics  . Smoking status: Former Games developermoker  . Smokeless tobacco: None  . Alcohol Use: 0.0 oz/week    0 Standard drinks or equivalent per week    Review of Systems  Constitutional:  Negative for fever. Eyes: Negative for visual changes. ENT: Negative for sore throat. Cardiovascular: Negative for chest pain. Respiratory: Negative for shortness of breath. Gastrointestinal: Negative for abdominal pain, vomiting and diarrhea. Genitourinary: Negative for dysuria. Musculoskeletal: Negative for back pain. Skin: Negative for rash. Neurological: Negative for headaches, focal weakness or numbness. Psychiatric:Positive for intentional medication overdose suicide attempt and anxiety  10-point ROS otherwise negative.  ____________________________________________   PHYSICAL EXAM:  VITAL SIGNS: ED Triage Vitals  Enc Vitals Group     BP 09/29/15 0348 116/66 mmHg     Pulse Rate 09/29/15 0348 115     Resp 09/29/15 0348 19     Temp 09/29/15 0348 97.4 F (36.3 C)     Temp Source 09/29/15 0348 Oral     SpO2 09/29/15 0348 91 %     Weight 09/29/15 0348 280 lb (127.007 kg)     Height 09/29/15 0348 6\' 1"  (1.854 m)     Head Cir --      Peak Flow --      Pain Score 09/29/15 0349 0     Pain Loc --      Pain Edu? --      Excl. in GC? --      Constitutional: Alert and oriented.  Eyes: Conjunctivae are normal. PERRL. Normal extraocular movements. ENT   Head: Normocephalic and atraumatic.   Nose: No congestion/rhinnorhea.   Mouth/Throat: Mucous membranes are moist.   Neck: No stridor. Hematological/Lymphatic/Immunilogical: No cervical lymphadenopathy. Cardiovascular: Tachycardia Normal and symmetric distal pulses are present in all extremities. No murmurs, rubs, or gallops. Respiratory: Normal respiratory effort without tachypnea nor retractions. Breath sounds are clear and equal bilaterally. No wheezes/rales/rhonchi. Gastrointestinal: Soft and nontender. No distention. There is no CVA tenderness. Genitourinary: deferred Musculoskeletal: Nontender with normal range of motion in all extremities. No joint effusions.  No lower extremity tenderness nor  edema. Neurologic:  Normal speech and language. No gross focal neurologic deficits are appreciated. Speech is normal.  Skin:  Skin is warm, dry and intact. No rash noted. Psychiatric:Depressed mood  ____________________________________________    LABS (pertinent positives/negatives)  Labs Reviewed  COMPREHENSIVE METABOLIC PANEL - Abnormal; Notable for the following:  Potassium 5.5 (*)    Glucose, Bld 100 (*)    BUN 41 (*)    Calcium 8.6 (*)    GFR calc non Af Amer 59 (*)    All other components within normal limits  CBC - Abnormal; Notable for the following:    WBC 11.1 (*)    RBC 4.02 (*)    Hemoglobin 12.6 (*)    HCT 35.9 (*)    All other components within normal limits  URINE DRUG SCREEN, QUALITATIVE ([facility] ONLY) - Abnormal; Notable for the following:    Benzodiazepine, Ur Scrn POSITIVE (*)    All other components within normal limits  ACETAMINOPHEN LEVEL - Abnormal; Notable for the following:    Acetaminophen (Tylenol), Serum <10 (*)    All other components within normal limits  LIPID PANEL - Abnormal; Notable for the following:    Triglycerides 153 (*)    HDL 40 (*)    All other components within normal limits  TROPONIN I - Abnormal; Notable for the following:    Troponin I 0.06 (*)    All other components within normal limits  TROPONIN I - Abnormal; Notable for the following:    Troponin I 0.04 (*)    All other components within normal limits  TROPONIN I - Abnormal; Notable for the following:    Troponin I 0.07 (*)    All other components within normal limits  BASIC METABOLIC PANEL - Abnormal; Notable for the following:    Potassium 5.4 (*)    Glucose, Bld 100 (*)    BUN 39 (*)    Creatinine, Ser 1.33 (*)    Calcium 8.4 (*)    GFR calc non Af Amer 53 (*)    All other components within normal limits  MRSA PCR SCREENING  TROPONIN I  ETHANOL  SALICYLATE LEVEL  MAGNESIUM  POTASSIUM  BASIC METABOLIC PANEL  CBC      ____________________________________________   EKG  ED ECG REPORT I, Wharton N BROWN, the attending physician, personally viewed and interpreted this ECG.   Date: 09/30/2015  EKG Time: 3:36 AM  Rate: 118  Rhythm: Atrial fibrillation with rapid ventricular response  Axis: Left axis deviation  Intervals: Normal  ST&T Change: 4 mm ST segment elevation V3 4 and 5.   ____________________________________________     Critical Care performed: CRITICAL CARE Performed by: Darci Current   Total critical care time: 45 minutes  Critical care time was exclusive of separately billable procedures and treating other patients.  Critical care was necessary to treat or prevent imminent or life-threatening deterioration.  Critical care was time spent personally by me on the following activities: development of treatment plan with patient and/or surrogate as well as nursing, discussions with consultants, evaluation of patient's response to treatment, examination of patient, obtaining history from patient or surrogate, ordering and performing treatments and interventions, ordering and review of laboratory studies, ordering and review of radiographic studies, pulse oximetry and re-evaluation of patient's condition.   ____________________________________________   INITIAL IMPRESSION / ASSESSMENT AND PLAN / ED COURSE  Pertinent labs & imaging results that were available during my care of the patient were reviewed by me and considered in my medical decision making (see chart for details).  Patient received 30 g of activated charcoal by mouth immediately upon arrival  to the emergency department. Patient discussed with Dr. Allena Katz cardiologist on call at [facility] secondary to EKG changes of ST segment elevation V3 V4 and V5 in comparison to  old EKG. Dr. Allena KatzPatel advised serial cardiac enzymes. Patient admitted to Dr. Haroldine Lawsrossley  ____________________________________________   FINAL CLINICAL  IMPRESSION(S) / ED DIAGNOSES  Final diagnoses:  Medication overdose, intentional self-harm, initial encounter [facility](HCC)      Darci Currentandolph N Brown, MD 09/30/15 [phone]

## 2015-09-29 NOTE — Consult Note (Signed)
[facility] Face-to-Face Psychiatry Consult   Reason for Consult:  Consult for 70 year old man who came into the hospital after a suicide attempt Referring Physician:  Sudini Patient Identification: Jesse [name] MRN:  [id number] Principal Diagnosis: Depression, major, single episode, severe (HCC) Diagnosis:   Patient Active Problem List   Diagnosis Date Noted  . Overdose [T50.901A] 09/29/2015  . CAD (coronary artery disease) [I25.10] 09/29/2015  . Depression, major, single episode, severe (HCC) [F32.2] 09/29/2015  . Suicidal ideation [R45.851] 09/29/2015  . Suicide attempt (HCC) [T14.91] 09/29/2015  . Arterial hypotension [I95.9] 09/19/2015  . Chest pain [R07.9] 09/18/2015  . Hypotension [I95.9] 09/18/2015  . ARF (acute renal failure) (HCC) [N17.9]   . Normochromic normocytic anemia [D64.9]   . Thrombocytopenia (HCC) [D69.6]   . Generalized weakness [R53.1] 07/26/2015  . Elevated troponin [R79.89] 07/26/2015  . S/P cardiac catheterization [Z98.890] 07/26/2015  . Encephalopathy, metabolic [G93.41] 07/26/2015  . Obstructive sleep apnea [G47.33] 07/26/2015  . Restless leg syndrome [G25.81] 07/26/2015  . Essential hypertension [I10] 07/26/2015  . Lymphedema [I89.0] 07/26/2015  . Obesity [E66.9] 07/26/2015  . Rhabdomyolysis [M62.82] 07/24/2015  . Fall [W19.XXXA] 07/24/2015    Total Time spent with patient: 1 hour  Subjective:   Jesse [name] is a 70 y.o. male patient admitted with "I tried to off myself".  HPI:  Patient interviewed. Chart reviewed. 70 year old man brought into the hospital after intentional overdose. He tells me that it was clonazepam but that's not really consistent with his medicines on his reconciliation suggesting that it may have been citalopram. In any case he said that he had been planning it for a while. He can't really describe why he wants to die but says that his mood is very depressed. He feels hopeless. Lethargic. Still sleeping adequately. Eating reasonably  well. Denies auditory or visual hallucinations. Says that he no longer is wanting to kill himself right now. Patient apparently was prescribed an antidepressant but has not been seeing a psychiatrist. He said there had been appointment made for him to see one but he has not gotten the appointment yet  Social history: Patient is living with his wife. Has 2 adult children living outside the home. He has not worked in quite some time.  Medical history: Extensive lymphedema in his legs. Congestive heart failure. History of coronary artery disease. History of sleep apnea and restless leg syndrome.  Substance abuse history: Patient says that he stopped drinking a couple years ago although he says that he never really thought was a problem even at that time. Denies any other drug abuse.  Past Psychiatric History: He denies that he is ever seen a psychiatrist for any mental health treatment in the past. Remarkably he tried to kill himself 2 months ago and was also hospitalized but somehow was not referred for any mental health treatment at that time. Patient denies any history of mania. Says that before about a year ago he had never had depression problems. He can't really reconcile why things of started and gotten so bad recently.  Risk to Self: Is patient at risk for suicide?: Yes Risk to Others:   Prior Inpatient Therapy:   Prior Outpatient Therapy:    Past Medical History:  Past Medical History  Diagnosis Date  . Hypertension   . Diabetes mellitus without complication (HCC)   . CHF (congestive heart failure) (HCC)   . CAD (coronary artery disease)   . A-fib (HCC)   . Sleep apnea   . Chronic edema  Past Surgical History  Procedure Laterality Date  . Pacemaker insertion    . Cardiac catheterization Right 07/25/2015    Procedure: Left Heart Cath and Coronary Angiography;  Surgeon: Dionisio David, MD;  Location: [facility];  Service: Cardiovascular;  Laterality: Right;   Family  History:  Family History  Problem Relation Age of Onset  . Diabetes Brother    Family Psychiatric  History: Patient denies any family history of mental illness or substance abuse problems. Social History:  History  Alcohol Use  . 0.0 oz/week  . 0 Standard drinks or equivalent per week     History  Drug Use No    Social History   Social History  . Marital Status: Married    Spouse Name: N/A  . Number of Children: N/A  . Years of Education: N/A   Occupational History  . retired    Social History Main Topics  . Smoking status: Former Research scientist (life sciences)  . Smokeless tobacco: None  . Alcohol Use: 0.0 oz/week    0 Standard drinks or equivalent per week  . Drug Use: No  . Sexual Activity: Not Asked   Other Topics Concern  . None   Social History Narrative   Additional Social History:    Allergies:   Allergies  Allergen Reactions  . Sulfa Antibiotics Other (See Comments)    hallucinations   . Latex Rash    Labs:  Results for orders placed or performed during the hospital encounter of 09/29/15 (from the past 48 hour(s))  Comprehensive metabolic panel     Status: Abnormal   Collection Time: 09/29/15  4:08 AM  Result Value Ref Range   Sodium 137 135 - 145 mmol/L   Potassium 5.5 (H) 3.5 - 5.1 mmol/L   Chloride 105 101 - 111 mmol/L   CO2 23 22 - 32 mmol/L   Glucose, Bld 100 (H) 65 - 99 mg/dL   BUN 41 (H) 6 - 20 mg/dL   Creatinine, Ser 1.22 0.61 - 1.24 mg/dL   Calcium 8.6 (L) 8.9 - 10.3 mg/dL   Total Protein 6.9 6.5 - 8.1 g/dL   Albumin 4.2 3.5 - 5.0 g/dL   AST 26 15 - 41 U/L   ALT 21 17 - 63 U/L   Alkaline Phosphatase 71 38 - 126 U/L   Total Bilirubin 0.8 0.3 - 1.2 mg/dL   GFR calc non Af Amer 59 (L) >60 mL/min   GFR calc Af Amer >60 >60 mL/min    Comment: (NOTE) The eGFR has been calculated using the CKD EPI equation. This calculation has not been validated in all clinical situations. eGFR's persistently <60 mL/min signify possible Chronic Kidney Disease.    Anion  gap 9 5 - 15  cbc     Status: Abnormal   Collection Time: 09/29/15  4:08 AM  Result Value Ref Range   WBC 11.1 (H) 3.8 - 10.6 K/uL   RBC 4.02 (L) 4.40 - 5.90 MIL/uL   Hemoglobin 12.6 (L) 13.0 - 18.0 g/dL   HCT 35.9 (L) 40.0 - 52.0 %   MCV 89.3 80.0 - 100.0 fL   MCH 31.4 26.0 - 34.0 pg   MCHC 35.2 32.0 - 36.0 g/dL   RDW 13.7 11.5 - 14.5 %   Platelets 158 150 - 440 K/uL  Troponin I     Status: None   Collection Time: 09/29/15  5:00 AM  Result Value Ref Range   Troponin I 0.03 <0.031 ng/mL  Comment:        NO INDICATION OF MYOCARDIAL INJURY.   Ethanol     Status: None   Collection Time: 09/29/15  5:46 AM  Result Value Ref Range   Alcohol, Ethyl (B) <5 <5 mg/dL    Comment:        LOWEST DETECTABLE LIMIT FOR SERUM ALCOHOL IS 5 mg/dL FOR MEDICAL PURPOSES ONLY   Acetaminophen level     Status: Abnormal   Collection Time: 09/29/15  5:46 AM  Result Value Ref Range   Acetaminophen (Tylenol), Serum <10 (L) 10 - 30 ug/mL    Comment:        THERAPEUTIC CONCENTRATIONS VARY SIGNIFICANTLY. A RANGE OF 10-30 ug/mL MAY BE AN EFFECTIVE CONCENTRATION FOR MANY PATIENTS. HOWEVER, SOME ARE BEST TREATED AT CONCENTRATIONS OUTSIDE THIS RANGE. ACETAMINOPHEN CONCENTRATIONS >150 ug/mL AT 4 HOURS AFTER INGESTION AND >50 ug/mL AT 12 HOURS AFTER INGESTION ARE OFTEN ASSOCIATED WITH TOXIC REACTIONS.   Salicylate level     Status: None   Collection Time: 09/29/15  5:46 AM  Result Value Ref Range   Salicylate Lvl <8.1 2.8 - 30.0 mg/dL  Urine Drug Screen, Qualitative     Status: Abnormal   Collection Time: 09/29/15  6:29 AM  Result Value Ref Range   Tricyclic, Ur Screen NONE DETECTED NONE DETECTED   Amphetamines, Ur Screen NONE DETECTED NONE DETECTED   MDMA (Ecstasy)Ur Screen NONE DETECTED NONE DETECTED   Cocaine Metabolite,Ur [place] NONE DETECTED NONE DETECTED   Opiate, Ur Screen NONE DETECTED NONE DETECTED   Phencyclidine (PCP) Ur S NONE DETECTED NONE DETECTED   Cannabinoid 50 Ng, Ur [place] NONE  DETECTED NONE DETECTED   Barbiturates, Ur Screen NONE DETECTED NONE DETECTED   Benzodiazepine, Ur Scrn POSITIVE (A) NONE DETECTED   Methadone Scn, Ur NONE DETECTED NONE DETECTED    Comment: (NOTE) 448  Tricyclics, urine               Cutoff 1000 ng/mL 200  Amphetamines, urine             Cutoff 1000 ng/mL 300  MDMA (Ecstasy), urine           Cutoff 500 ng/mL 400  Cocaine Metabolite, urine       Cutoff 300 ng/mL 500  Opiate, urine                   Cutoff 300 ng/mL 600  Phencyclidine (PCP), urine      Cutoff 25 ng/mL 700  Cannabinoid, urine              Cutoff 50 ng/mL 800  Barbiturates, urine             Cutoff 200 ng/mL 900  Benzodiazepine, urine           Cutoff 200 ng/mL 1000 Methadone, urine                Cutoff 300 ng/mL 1100 1200 The urine drug screen provides only a preliminary, unconfirmed 1300 analytical test result and should not be used for non-medical 1400 purposes. Clinical consideration and professional judgment should 1500 be applied to any positive drug screen result due to possible 1600 interfering substances. A more specific alternate chemical method 1700 must be used in order to obtain a confirmed analytical result.  1800 Gas chromato graphy / mass spectrometry (GC/MS) is the preferred 1900 confirmatory method.   Lipid panel     Status: Abnormal   Collection Time: 09/29/15  6:58 AM  Result Value Ref Range   Cholesterol 98 0 - 200 mg/dL   Triglycerides 153 (H) <150 mg/dL   HDL 40 (L) >40 mg/dL   Total CHOL/HDL Ratio 2.5 RATIO   VLDL 31 0 - 40 mg/dL   LDL Cholesterol 27 0 - 99 mg/dL    Comment:        Total Cholesterol/HDL:CHD Risk Coronary Heart Disease Risk Table                     Men   Women  1/2 Average Risk   3.4   3.3  Average Risk       5.0   4.4  2 X Average Risk   9.6   7.1  3 X Average Risk  23.4   11.0        Use the calculated Patient Ratio above and the CHD Risk Table to determine the patient's CHD Risk.        ATP III CLASSIFICATION  (LDL):  <100     mg/dL   Optimal  100-129  mg/dL   Near or Above                    Optimal  130-159  mg/dL   Borderline  160-189  mg/dL   High  >190     mg/dL   Very High   MRSA PCR Screening     Status: None   Collection Time: 09/29/15  9:00 AM  Result Value Ref Range   MRSA by PCR NEGATIVE NEGATIVE    Comment:        The GeneXpert MRSA Assay (FDA approved for NASAL specimens only), is one component of a comprehensive MRSA colonization surveillance program. It is not intended to diagnose MRSA infection nor to guide or monitor treatment for MRSA infections.   Troponin I     Status: Abnormal   Collection Time: 09/29/15 10:06 AM  Result Value Ref Range   Troponin I 0.07 (H) <0.031 ng/mL    Comment: READ BACK AND VERIFIED WITH TAYLOR FAULKNER AT 1610 ON 09/29/15.[place]land KitchenMarland KitchenArendtsville        PERSISTENTLY INCREASED TROPONIN VALUES IN THE RANGE OF 0.04-0.49 ng/mL CAN BE SEEN IN:       -UNSTABLE ANGINA       -CONGESTIVE HEART FAILURE       -MYOCARDITIS       -CHEST TRAUMA       -ARRYHTHMIAS       -LATE PRESENTING MYOCARDIAL INFARCTION       -COPD   CLINICAL FOLLOW-UP RECOMMENDED.   Basic metabolic panel     Status: Abnormal   Collection Time: 09/29/15 10:06 AM  Result Value Ref Range   Sodium 135 135 - 145 mmol/L   Potassium 5.4 (H) 3.5 - 5.1 mmol/L   Chloride 103 101 - 111 mmol/L   CO2 26 22 - 32 mmol/L   Glucose, Bld 100 (H) 65 - 99 mg/dL   BUN 39 (H) 6 - 20 mg/dL   Creatinine, Ser 1.33 (H) 0.61 - 1.24 mg/dL   Calcium 8.4 (L) 8.9 - 10.3 mg/dL   GFR calc non Af Amer 53 (L) >60 mL/min   GFR calc Af Amer >60 >60 mL/min    Comment: (NOTE) The eGFR has been calculated using the CKD EPI equation. This calculation has not been validated in all clinical situations. eGFR's persistently <60 mL/min signify possible Chronic Kidney Disease.    Anion gap 6 5 - 15  Magnesium     Status: None   Collection Time: 09/29/15 10:06 AM  Result Value Ref Range   Magnesium 2.1 1.7 - 2.4 mg/dL   Troponin I     Status: Abnormal   Collection Time: 09/29/15  3:51 PM  Result Value Ref Range   Troponin I 0.06 (H) <0.031 ng/mL    Comment: READ BACK AND VERIFIED WITH TAYLOR FOSTER AT 1761 09/29/15 MLZ        PERSISTENTLY INCREASED TROPONIN VALUES IN THE RANGE OF 0.04-0.49 ng/mL CAN BE SEEN IN:       -UNSTABLE ANGINA       -CONGESTIVE HEART FAILURE       -MYOCARDITIS       -CHEST TRAUMA       -ARRYHTHMIAS       -LATE PRESENTING MYOCARDIAL INFARCTION       -COPD   CLINICAL FOLLOW-UP RECOMMENDED.   Potassium     Status: None   Collection Time: 09/29/15  3:51 PM  Result Value Ref Range   Potassium 4.9 3.5 - 5.1 mmol/L    Current Facility-Administered Medications  Medication Dose Route Frequency Provider Last Rate Last Dose  . acetaminophen (TYLENOL) tablet 650 mg  650 mg Oral Q6H PRN Debby Crosley, MD       Or  . acetaminophen (TYLENOL) suppository 650 mg  650 mg Rectal Q6H PRN Debby Crosley, MD      . carvedilol (COREG) tablet 3.125 mg  3.125 mg Oral BID WC Debby Crosley, MD   3.125 mg at 09/29/15 1737  . clopidogrel (PLAVIX) tablet 75 mg  75 mg Oral Daily Debby Crosley, MD   75 mg at 09/29/15 0954  . enoxaparin (LOVENOX) injection 40 mg  40 mg Subcutaneous Q24H Debby Crosley, MD   40 mg at 09/29/15 0954  . gabapentin (NEURONTIN) capsule 400 mg  400 mg Oral TID Quintella Baton, MD   400 mg at 09/29/15 1737  . isosorbide mononitrate (IMDUR) 24 hr tablet 60 mg  60 mg Oral Daily Debby Crosley, MD   60 mg at 09/29/15 0954  . levothyroxine (SYNTHROID, LEVOTHROID) tablet 25 mcg  25 mcg Oral Daily Quintella Baton, MD   25 mcg at 09/29/15 0954  . LORazepam (ATIVAN) tablet 1 mg  1 mg Oral BID Quintella Baton, MD   1 mg at 09/29/15 0954  . nitroGLYCERIN (NITROSTAT) SL tablet 0.4 mg  0.4 mg Sublingual Q5 min PRN Debby Crosley, MD      . pantoprazole (PROTONIX) EC tablet 40 mg  40 mg Oral Daily Debby Crosley, MD   40 mg at 09/29/15 0954  . rOPINIRole (REQUIP) tablet 2 mg  2 mg Oral TID Hillary Bow, MD   2 mg at 09/29/15 1737  . senna-docusate (Senokot-S) tablet 1 tablet  1 tablet Oral QHS PRN Debby Crosley, MD      . sodium chloride flush (NS) 0.9 % injection 3 mL  3 mL Intravenous Q12H Debby Crosley, MD   3 mL at 09/29/15 6073    Musculoskeletal: Strength & Muscle Tone: decreased Gait & Station: unable to stand Patient leans: Backward  Psychiatric Specialty Exam: Physical Exam  Constitutional: He appears well-nourished. He appears lethargic. He appears ill.  HENT:  Head: Normocephalic and atraumatic.  Eyes: Conjunctivae are normal. Pupils are equal, round, and reactive to light.  Neck: Normal range of motion.  Cardiovascular: Normal heart sounds.   Respiratory: Effort normal.  GI: Soft.  Musculoskeletal: Normal range of motion.  Lymphadenopathy:  Patient has extensive lymphedema in his legs  Neurological: He appears lethargic.  Skin: Skin is warm and dry.  Psychiatric: His speech is normal and behavior is normal. His affect is blunt. Cognition and memory are normal. He expresses impulsivity. He exhibits a depressed mood. He expresses suicidal ideation.    Review of Systems  Constitutional: Negative.   HENT: Negative.   Eyes: Negative.   Respiratory: Negative.   Cardiovascular: Negative.   Gastrointestinal: Negative.   Musculoskeletal: Negative.   Skin: Negative.   Neurological: Negative.   Psychiatric/Behavioral: Positive for depression and suicidal ideas. Negative for hallucinations, memory loss and substance abuse. The patient is nervous/anxious. The patient does not have insomnia.     Blood pressure 107/73, pulse 66, temperature 97.8 F (36.6 C), temperature source Axillary, resp. rate 19, height '6\' 1"'$  (1.854 m), weight 130 kg (286 lb 9.6 oz), SpO2 100 %.Body mass index is 37.82 kg/(m^2).  General Appearance: Disheveled  Eye Contact:  Good  Speech:  Slow  Volume:  Decreased  Mood:  Dysphoric  Affect:  Depressed  Thought Process:  Goal Directed   Orientation:  Full (Time, Place, and Person)  Thought Content:  Logical  Suicidal Thoughts:  Yes.  with intent/plan  Homicidal Thoughts:  No  Memory:  Immediate;   Good Recent;   Fair Remote;   Fair  Judgement:  Impaired  Insight:  Fair  Psychomotor Activity:  Decreased  Concentration:  Concentration: Fair  Recall:  [employer] of Knowledge:  Fair  Language:  Fair  Akathisia:  No  Handed:  Right  AIMS (if indicated):     Assets:  Communication Skills Housing Social Support  ADL's:  Impaired  Cognition:  WNL  Sleep:        Treatment Plan Summary: Plan 70 year old man who admits to a serious attempt to kill himself. Brought into the hospital only because his wife found him. Patient is endorsing severely depressed mood and suicidal thoughts although he does not elaborate much on it. He is not endorsing psychotic symptoms. He denies acute suicidal thoughts here in the hospital but this is his second suicide attempt. Patient clearly needs further psychiatric evaluation and treatment. Once he is medically stabilized I will recommend transfer to psychiatry. He is already under IVC. We do not have any beds available right now but we hope that we may have some more available by the weekend.  Disposition: Recommend psychiatric Inpatient admission when medically cleared.  Alethia Berthold, MD 09/29/2015 8:50 PM

## 2015-09-29 NOTE — ED Notes (Signed)
Pt presents to ED after an intentional overdose per EMS. Pt took approx 83 Celexa 20mg  tab. Medicine was filled on the 15th and it is supposed to be taken once per day. Pt is alert and able to answer questions upon arrival. Pt states he was trying to hurt himself when asked why he took the medicine, he states "Im crazy. Crazy people kill themselves right." With EMS, pt with paced rhythm, BP 112 palpated, CBG 119. Hx of CHF. Pt told EMS he was trying to hurt himself. Pt admitted to this RN he was trying to hurt himself.

## 2015-09-29 NOTE — ED Notes (Signed)
Pt rolling from side to side on stretcher, has been since arrival. States he is hot and sweaty. Pt is restless. Cardiac leads keep coming off and staff keep replacing them to monitor heart rhythm.

## 2015-09-29 NOTE — Progress Notes (Signed)
Spoke with provider who was rounding at bedside about hyperkalemia, held ordered dose of potassium. Mentioned to provider that patient was complaining of restless leg and could not get comfortable in the bed, see new orders.  Will continue to monitor.

## 2015-09-29 NOTE — H&P (Signed)
PCP:   [name], MD   Chief Complaint:  Intentional, overdose  HPI: This a 70 year old male who is very depressed. Tonight he took a total of 90 twenty mg Celexa tablets. He took them at approximately 7:30 AM. His wife from under him unresponsive and called 911, he was brought to ER. In the ER where he has received activated charcoal. he is involuntarily committed. Psychiatry has accepted the patient. He has EKG changes but no chest pains. The patient does not meet Scarbrough criteria. Cardiology Dr Welton Flakeskhan will see patient in stepdown. The patient has had a recent left heart cath 07/2015 that showed high-grade stenosis of proximal RCA which is currently being managed medically, Plavix was added to medical regiment.  Review of Systems:  Difficult to review due to patient's impression  Past Medical History: Past Medical History  Diagnosis Date  . Hypertension   . Diabetes mellitus without complication (HCC)   . CHF (congestive heart failure) (HCC)   . CAD (coronary artery disease)   . A-fib (HCC)   . Sleep apnea   . Chronic edema    Past Surgical History  Procedure Laterality Date  . Pacemaker insertion    . Cardiac catheterization Right 07/25/2015    Procedure: Left Heart Cath and Coronary Angiography;  Surgeon: Laurier NancyShaukat A Khan, MD;  Location: [facility] INVASIVE CV [facility];  Service: Cardiovascular;  Laterality: Right;    Medications: Prior to Admission medications   Medication Sig Start Date End Date Taking? Authorizing Provider  Ascorbic Acid (VITAMIN C) 1000 MG tablet Take 1,000 mg by mouth daily.    Historical Provider, MD  bumetanide (BUMEX) 1 MG tablet Take 1 tablet (1 mg total) by mouth daily. Or as directed by cardiologist 09/20/15   Cleora Fleetlanford L Johnson, MD  cholecalciferol (VITAMIN D) 400 units TABS tablet Take 2,000 Units by mouth.    Historical Provider, MD  citalopram (CELEXA) 20 MG tablet Take 20 mg by mouth daily. 06/07/15   Historical Provider, MD  clopidogrel  (PLAVIX) 75 MG tablet Take 1 tablet (75 mg total) by mouth daily. 07/26/15   Katharina Caperima Vaickute, MD  ferrous sulfate 325 (65 FE) MG tablet Take 325 mg by mouth daily with breakfast.    Historical Provider, MD  gabapentin (NEURONTIN) 400 MG capsule Take 1 capsule by mouth 3 (three) times daily. 06/26/15   Historical Provider, MD  isosorbide mononitrate (IMDUR) 60 MG 24 hr tablet Take 60 mg by mouth daily.    Historical Provider, MD  levothyroxine (SYNTHROID, LEVOTHROID) 25 MCG tablet Take 25 mcg by mouth daily. 05/26/15   Historical Provider, MD  LORazepam (ATIVAN) 1 MG tablet Take 1 mg by mouth 2 (two) times daily. 04/18/15   Historical Provider, MD  nitroGLYCERIN (NITROSTAT) 0.4 MG SL tablet Place 0.4 mg under the tongue every 5 (five) minutes as needed for chest pain.    Historical Provider, MD  pantoprazole (PROTONIX) 40 MG tablet Take 40 mg by mouth daily. 05/02/15   Historical Provider, MD  potassium chloride SA (K-DUR,KLOR-CON) 20 MEQ tablet Take 1 tablet (20 mEq total) by mouth daily. 09/19/15   Clanford Cyndie MullL Johnson, MD  rOPINIRole (REQUIP) 2 MG tablet Take 1 tablet by mouth 3 (three) times daily. 06/11/15   Historical Provider, MD  spironolactone (ALDACTONE) 25 MG tablet Take 25 mg by mouth daily. 05/25/15   Historical Provider, MD  vitamin B-12 (CYANOCOBALAMIN) 1000 MCG tablet Take 1,000 mcg by mouth daily.    Historical Provider, MD    Allergies:  Allergies  Allergen Reactions  . Sulfa Antibiotics Other (See Comments)    hallucinations   . Latex Rash    Social History:  reports that he has quit smoking. He does not have any smokeless tobacco history on file. He reports that he drinks alcohol. He reports that he does not use illicit drugs.  Family History: Family History  Problem Relation Age of Onset  . Diabetes Brother     Physical Exam: Filed Vitals:   09/29/15 0515 09/29/15 0530 09/29/15 0600 09/29/15 0615  BP: 109/93 119/43 137/92 113/66  Pulse: 109 102 107 82  Temp:      TempSrc:       Resp:  18    Height:      Weight:      SpO2: 94% 93% 92% 93%    General:  Alert,  Very restless patient, , well developed and nourished, no acute distress Eyes: PERRLA, pink conjunctiva, no scleral icterus ENT: Moist oral mucosa, neck supple, no thyromegaly Lungs: clear to ascultation, no wheeze, no crackles, no use of accessory muscles Cardiovascular: regular rate and rhythm, no regurgitation, no gallops, no murmurs. No carotid bruits, no JVD Abdomen: soft, positive BS, non-tender, non-distended, no organomegaly, not an acute abdomen GU: not examined Neuro: CN II - XII grossly intact, Musculoskeletal: strength moves all limbs, lymphedema, cyanosis Skin: no rash, no subcutaneous crepitation, no decubitus Psych: Depressed patient   Labs on Admission:   Recent Labs  09/29/15 0408  NA 137  K 5.5*  CL 105  CO2 23  GLUCOSE 100*  BUN 41*  CREATININE 1.22  CALCIUM 8.6*    Recent Labs  09/29/15 0408  AST 26  ALT 21  ALKPHOS 71  BILITOT 0.8  PROT 6.9  ALBUMIN 4.2   No results for input(s): LIPASE, AMYLASE in the last 72 hours.  Recent Labs  09/29/15 0408  WBC 11.1*  HGB 12.6*  HCT 35.9*  MCV 89.3  PLT 158    Recent Labs  09/29/15 0500  TROPONINI 0.03   Invalid input(s): POCBNP No results for input(s): DDIMER in the last 72 hours. No results for input(s): HGBA1C in the last 72 hours. No results for input(s): CHOL, HDL, LDLCALC, TRIG, CHOLHDL, LDLDIRECT in the last 72 hours. No results for input(s): TSH, T4TOTAL, T3FREE, THYROIDAB in the last 72 hours.  Invalid input(s): FREET3 No results for input(s): VITAMINB12, FOLATE, FERRITIN, TIBC, IRON, RETICCTPCT in the last 72 hours.   Dominance: Right   Left Anterior Descending   . Dist LAD lesion, 50% stenosed.   . First Diagonal Branch   . Ost 1st Diag to 1st Diag lesion, 50% stenosed.     Right Coronary Artery  Mid RCA filled by collaterals from Ost RCA. Dist RCA filled by collaterals from Ost  RCA.   Marland Kitchen. Prox RCA lesion, 99% stenosed.      Micro Results: No results found for this or any previous visit (from the past 240 hour(s)).   Radiological Exams on Admission: No results found.  Assessment/Plan Present on Admission:  . Overdose -Admit to stepdown  -Monitor in telemetry, one-to-one sitter -Involuntarily committed -Transferred to psych floor on discharge  . CAD (coronary artery disease)/EKG changes -Monitor in telemetry, cycle cardiac enzymes -Cardiologist Dr. Welton FlakesKhan to see patient this morning -Patient without any chest discomfort -Continue aspirin, Plavix, Imdur and carvedilol. -lipid panel ordered  . Essential hypertension -Stable, home medications resumed  . Obstructive sleep apnea -Stable, CPAP ordered  . Restless leg syndrome -Stable,  monitor    Jesse [name] 09/29/2015, 6:35 AM

## 2015-09-30 ENCOUNTER — Other Ambulatory Visit: Payer: Medicare Other

## 2015-09-30 LAB — BASIC METABOLIC PANEL
ANION GAP: 5 (ref 5–15)
BUN: 33 mg/dL — AB (ref 6–20)
CALCIUM: 8.3 mg/dL — AB (ref 8.9–10.3)
CHLORIDE: 103 mmol/L (ref 101–111)
CO2: 29 mmol/L (ref 22–32)
CREATININE: 1.12 mg/dL (ref 0.61–1.24)
GFR calc non Af Amer: >60 mL/min (ref >60)
Glucose, Bld: 95 mg/dL (ref 65–99)
Potassium: 3.8 mmol/L (ref 3.5–5.1)
SODIUM: 137 mmol/L (ref 135–145)

## 2015-09-30 LAB — CBC
HCT: 31.3 % — ABNORMAL LOW (ref 40.0–52.0)
Hemoglobin: 10.9 g/dL — ABNORMAL LOW (ref 13.0–18.0)
MCH: 31 pg (ref 26.0–34.0)
MCHC: 34.9 g/dL (ref 32.0–36.0)
MCV: 88.9 fL (ref 80.0–100.0)
PLATELETS: 105 10*3/uL — AB (ref 150–440)
RBC: 3.52 MIL/uL — ABNORMAL LOW (ref 4.40–5.90)
RDW: 13.2 % (ref 11.5–14.5)
WBC: 3.5 10*3/uL — AB (ref 3.8–10.6)

## 2015-09-30 MED ORDER — BUMETANIDE 1 MG PO TABS
1.0000 mg | ORAL_TABLET | Freq: Every day | ORAL | Status: DC
  Administered 2015-09-30: 1 mg via ORAL
  Filled 2015-09-30: qty 1

## 2015-09-30 NOTE — H&P (Signed)
Jesse [name] is a 70 y.o. male  [id number]  Primary Cardiologist: Adrian Blackwater Reason for Consultation: Abnormal EKG  HPI: This is a 70 year old white male with a past medical history of coronary artery disease atrial fibrillation sleep apnea CHF presented to the hospital after overdosing himself with medication. I was asked to evaluate the patient because patient had supposedly ST changes on EKG. But actually the patient was in atrial fibrillation with left bundle branch block with rapid ventricular response rate.   Review of Systems: No chest pain shortness of breath orthopnea PND or leg swelling at this time   Past Medical History  Diagnosis Date  . Hypertension   . Diabetes mellitus without complication (HCC)   . CHF (congestive heart failure) (HCC)   . CAD (coronary artery disease)   . A-fib (HCC)   . Sleep apnea   . Chronic edema     Medications Prior to Admission  Medication Sig Dispense Refill  . Ascorbic Acid (VITAMIN C) 1000 MG tablet Take 1,000 mg by mouth daily.    . bumetanide (BUMEX) 1 MG tablet Take 1 tablet (1 mg total) by mouth daily. Or as directed by cardiologist (Patient taking differently: Take 2 mg by mouth daily. Or as directed by cardiologist)  0  . Cholecalciferol (VITAMIN D3) 2000 units TABS Take 2,000 Units by mouth daily.    . citalopram (CELEXA) 40 MG tablet Take 40 mg by mouth daily.    . clopidogrel (PLAVIX) 75 MG tablet Take 1 tablet (75 mg total) by mouth daily. 30 tablet 6  . ferrous sulfate 325 (65 FE) MG tablet Take 650 mg by mouth 2 (two) times daily.     Marland Kitchen gabapentin (NEURONTIN) 400 MG capsule Take 1 capsule by mouth 3 (three) times daily as needed (for pain).   0  . isosorbide mononitrate (IMDUR) 60 MG 24 hr tablet Take 60 mg by mouth daily.    Marland Kitchen levothyroxine (SYNTHROID, LEVOTHROID) 25 MCG tablet Take 25 mcg by mouth daily.  0  . LORazepam (ATIVAN) 1 MG tablet Take 1 mg by mouth at bedtime.   0  . nitroGLYCERIN (NITROSTAT) 0.4 MG SL  tablet Place 0.4 mg under the tongue every 5 (five) minutes as needed for chest pain.    . pantoprazole (PROTONIX) 40 MG tablet Take 40 mg by mouth daily.  0  . potassium chloride SA (K-DUR,KLOR-CON) 20 MEQ tablet Take 1 tablet (20 mEq total) by mouth daily.    Marland Kitchen rOPINIRole (REQUIP) 2 MG tablet Take 4 mg by mouth at bedtime.   0  . spironolactone (ALDACTONE) 25 MG tablet Take 25 mg by mouth daily.  0  . vitamin B-12 (CYANOCOBALAMIN) 1000 MCG tablet Take 1,000 mcg by mouth daily.       . carvedilol  3.125 mg Oral BID WC  . clopidogrel  75 mg Oral Daily  . enoxaparin (LOVENOX) injection  40 mg Subcutaneous Q24H  . gabapentin  400 mg Oral TID  . isosorbide mononitrate  60 mg Oral Daily  . levothyroxine  25 mcg Oral Daily  . LORazepam  1 mg Oral BID  . pantoprazole  40 mg Oral Daily  . rOPINIRole  2 mg Oral TID  . sodium chloride flush  3 mL Intravenous Q12H    Infusions:    Allergies  Allergen Reactions  . Sulfa Antibiotics Other (See Comments)    hallucinations   . Latex Rash    Social History   Social History  .  Marital Status: Married    Spouse Name: N/A  . Number of Children: N/A  . Years of Education: N/A   Occupational History  . retired    Social History Main Topics  . Smoking status: Former Games developermoker  . Smokeless tobacco: Not on file  . Alcohol Use: 0.0 oz/week    0 Standard drinks or equivalent per week  . Drug Use: No  . Sexual Activity: Not on file   Other Topics Concern  . Not on file   Social History Narrative    Family History  Problem Relation Age of Onset  . Diabetes Brother     PHYSICAL EXAM: Filed Vitals:   09/30/15 0600 09/30/15 0700  BP: 108/70 114/71  Pulse: 64 65  Temp:    Resp: 17 17     Intake/Output Summary (Last 24 hours) at 09/30/15 0759 Last data filed at 09/29/15 2100  Gross per 24 hour  Intake    480 ml  Output   1300 ml  Net   -820 ml    General:  Well appearing. No respiratory difficulty HEENT: normal Neck:  supple. no JVD. Carotids 2+ bilat; no bruits. No lymphadenopathy or thryomegaly appreciated. Cor: PMI nondisplaced. Regular rate & rhythm. No rubs, gallops or murmurs. Lungs: clear Abdomen: soft, nontender, nondistended. No hepatosplenomegaly. No bruits or masses. Good bowel sounds. Extremities: no cyanosis, clubbing, rash, edema Neuro: alert & oriented x 3, cranial nerves grossly intact. moves all 4 extremities w/o difficulty. Affect pleasant.  ECG: A. fib with rapid ventricular response rate and left bundle branch block no evidence of STEMI  Results for orders placed or performed during the hospital encounter of 09/29/15 (from the past 24 hour(s))  MRSA PCR Screening     Status: None   Collection Time: 09/29/15  9:00 AM  Result Value Ref Range   MRSA by PCR NEGATIVE NEGATIVE  Troponin I     Status: Abnormal   Collection Time: 09/29/15 10:06 AM  Result Value Ref Range   Troponin I 0.07 (H) <0.031 ng/mL  Basic metabolic panel     Status: Abnormal   Collection Time: 09/29/15 10:06 AM  Result Value Ref Range   Sodium 135 135 - 145 mmol/L   Potassium 5.4 (H) 3.5 - 5.1 mmol/L   Chloride 103 101 - 111 mmol/L   CO2 26 22 - 32 mmol/L   Glucose, Bld 100 (H) 65 - 99 mg/dL   BUN 39 (H) 6 - 20 mg/dL   Creatinine, Ser 2.951.33 (H) 0.61 - 1.24 mg/dL   Calcium 8.4 (L) 8.9 - 10.3 mg/dL   GFR calc non Af Amer 53 (L) >60 mL/min   GFR calc Af Amer >60 >60 mL/min   Anion gap 6 5 - 15  Magnesium     Status: None   Collection Time: 09/29/15 10:06 AM  Result Value Ref Range   Magnesium 2.1 1.7 - 2.4 mg/dL  Troponin I     Status: Abnormal   Collection Time: 09/29/15  3:51 PM  Result Value Ref Range   Troponin I 0.06 (H) <0.031 ng/mL  Potassium     Status: None   Collection Time: 09/29/15  3:51 PM  Result Value Ref Range   Potassium 4.9 3.5 - 5.1 mmol/L  Troponin I     Status: Abnormal   Collection Time: 09/29/15  9:10 PM  Result Value Ref Range   Troponin I 0.04 (H) <0.031 ng/mL  Basic metabolic  panel     Status: Abnormal  Collection Time: 09/30/15  7:04 AM  Result Value Ref Range   Sodium 137 135 - 145 mmol/L   Potassium 3.8 3.5 - 5.1 mmol/L   Chloride 103 101 - 111 mmol/L   CO2 29 22 - 32 mmol/L   Glucose, Bld 95 65 - 99 mg/dL   BUN 33 (H) 6 - 20 mg/dL   Creatinine, Ser 4.781.12 0.61 - 1.24 mg/dL   Calcium 8.3 (L) 8.9 - 10.3 mg/dL   GFR calc non Af Amer >60 >60 mL/min   GFR calc Af Amer >60 >60 mL/min   Anion gap 5 5 - 15  CBC     Status: Abnormal   Collection Time: 09/30/15  7:04 AM  Result Value Ref Range   WBC 3.5 (L) 3.8 - 10.6 K/uL   RBC 3.52 (L) 4.40 - 5.90 MIL/uL   Hemoglobin 10.9 (L) 13.0 - 18.0 g/dL   HCT 29.531.3 (L) 62.140.0 - 30.852.0 %   MCV 88.9 80.0 - 100.0 fL   MCH 31.0 26.0 - 34.0 pg   MCHC 34.9 32.0 - 36.0 g/dL   RDW 65.713.2 84.611.5 - 96.214.5 %   Platelets 105 (L) 150 - 440 K/uL   No results found.   ASSESSMENT AND PLAN: Paroxysmal atrial fibrillation with rapid ventricular response rate and left bundle branch block. Patient denies any chest pain or shortness of breath and is being 100% VVI paced rhythm. No further workup is needed.  Jesse [name] A

## 2015-09-30 NOTE — Consult Note (Signed)
  Psychiatry: Spoke to TTS Robinette Hainesalvin Manning this morning. [facility]. Last night we were short of beds, but I hope one will be ready today.

## 2015-09-30 NOTE — Consult Note (Signed)
[name] Face-to-Face Psychiatry Consult   Reason for Consult:  Consult for 70 year old man who came into the hospital after a suicide attempt Referring Physician:  Sudini Patient Identification: Jesse [name] MRN:  [id number] Principal Diagnosis: Depression, major, single episode, severe ([place]) Diagnosis:   Patient Active Problem List   Diagnosis Date Noted  . Overdose [T50.901A] 09/29/2015  . CAD (coronary artery disease) [I25.10] 09/29/2015  . Depression, major, single episode, severe ([place]) [F32.2] 09/29/2015  . Suicidal ideation [R45.851] 09/29/2015  . Suicide attempt ([place]) [T14.91] 09/29/2015  . Arterial hypotension [I95.9] 09/19/2015  . Chest pain [R07.9] 09/18/2015  . Hypotension [I95.9] 09/18/2015  . ARF (acute renal failure) ([place]) [N17.9]   . Normochromic normocytic anemia [D64.9]   . Thrombocytopenia ([place]) [D69.6]   . Generalized weakness [R53.1] 07/26/2015  . Elevated troponin [R79.89] 07/26/2015  . S/P cardiac catheterization [Z98.890] 07/26/2015  . Encephalopathy, metabolic [G38.75] 64/33/2951  . Obstructive sleep apnea [G47.33] 07/26/2015  . Restless leg syndrome [G25.81] 07/26/2015  . Essential hypertension [I10] 07/26/2015  . Lymphedema [I89.0] 07/26/2015  . Obesity [E66.9] 07/26/2015  . Rhabdomyolysis [M62.82] 07/24/2015  . Fall [W19.XXXA] 07/24/2015    Total Time spent with patient: 30 minutes  Subjective:   Phillp [name] is a 70 y.o. male patient admitted with "I tried to off myself".  Follow-up for this 70 year old gentleman who tried to kill himself. On interview today the patient says that he is feeling fine. He appears to be somewhat cheerful. Completely denies any wish to die. He cannot reconcile his feelings now with why he tried to kill himself just the other day.  HPI:  Patient interviewed. Chart reviewed. 70 year old man brought into the hospital after intentional overdose. He tells me that it was clonazepam but that's not really consistent with his  medicines on his reconciliation suggesting that it may have been citalopram. In any case he said that he had been planning it for a while. He can't really describe why he wants to die but says that his mood is very depressed. He feels hopeless. Lethargic. Still sleeping adequately. Eating reasonably well. Denies auditory or visual hallucinations. Says that he no longer is wanting to kill himself right now. Patient apparently was prescribed an antidepressant but has not been seeing a psychiatrist. He said there had been appointment made for him to see one but he has not gotten the appointment yet  Social history: Patient is living with his wife. Has 2 adult children living outside the home. He has not worked in quite some time.  Medical history: Extensive lymphedema in his legs. Congestive heart failure. History of coronary artery disease. History of sleep apnea and restless leg syndrome.  Substance abuse history: Patient says that he stopped drinking a couple years ago although he says that he never really thought was a problem even at that time. Denies any other drug abuse.  Past Psychiatric History: He denies that he is ever seen a psychiatrist for any mental health treatment in the past. Remarkably he tried to kill himself 2 months ago and was also hospitalized but somehow was not referred for any mental health treatment at that time. Patient denies any history of mania. Says that before about a year ago he had never had depression problems. He can't really reconcile why things of started and gotten so bad recently.  Risk to Self: Is patient at risk for suicide?: Yes Risk to Others:   Prior Inpatient Therapy:   Prior Outpatient Therapy:    Past Medical History:  Past Medical History  Diagnosis Date  . Hypertension   . Diabetes mellitus without complication ([name])   . CHF (congestive heart failure) ([place])   . CAD (coronary artery disease)   . A-fib ([name])   . Sleep apnea   . Chronic edema      Past Surgical History  Procedure Laterality Date  . Pacemaker insertion    . Cardiac catheterization Right 07/25/2015    Procedure: Left Heart Cath and Coronary Angiography;  Surgeon: Dionisio David, MD;  Location: [facility];  Service: Cardiovascular;  Laterality: Right;   Family History:  Family History  Problem Relation Age of Onset  . Diabetes Brother    Family Psychiatric  History: Patient denies any family history of mental illness or substance abuse problems. Social History:  History  Alcohol Use  . 0.0 oz/week  . 0 Standard drinks or equivalent per week     History  Drug Use No    Social History   Social History  . Marital Status: Married    Spouse Name: N/A  . Number of Children: N/A  . Years of Education: N/A   Occupational History  . retired    Social History Main Topics  . Smoking status: Former Research scientist (life sciences)  . Smokeless tobacco: None  . Alcohol Use: 0.0 oz/week    0 Standard drinks or equivalent per week  . Drug Use: No  . Sexual Activity: Not Asked   Other Topics Concern  . None   Social History Narrative   Additional Social History:    Allergies:   Allergies  Allergen Reactions  . Sulfa Antibiotics Other (See Comments)    hallucinations   . Latex Rash    Labs:  Results for orders placed or performed during the hospital encounter of 09/29/15 (from the past 48 hour(s))  Comprehensive metabolic panel     Status: Abnormal   Collection Time: 09/29/15  4:08 AM  Result Value Ref Range   Sodium 137 135 - 145 mmol/L   Potassium 5.5 (H) 3.5 - 5.1 mmol/L   Chloride 105 101 - 111 mmol/L   CO2 23 22 - 32 mmol/L   Glucose, Bld 100 (H) 65 - 99 mg/dL   BUN 41 (H) 6 - 20 mg/dL   Creatinine, Ser 1.22 0.61 - 1.24 mg/dL   Calcium 8.6 (L) 8.9 - 10.3 mg/dL   Total Protein 6.9 6.5 - 8.1 g/dL   Albumin 4.2 3.5 - 5.0 g/dL   AST 26 15 - 41 U/L   ALT 21 17 - 63 U/L   Alkaline Phosphatase 71 38 - 126 U/L   Total Bilirubin 0.8 0.3 - 1.2 mg/dL   GFR  calc non Af Amer 59 (L) >60 mL/min   GFR calc Af Amer >60 >60 mL/min    Comment: (NOTE) The eGFR has been calculated using the CKD EPI equation. This calculation has not been validated in all clinical situations. eGFR's persistently <60 mL/min signify possible Chronic Kidney Disease.    Anion gap 9 5 - 15  cbc     Status: Abnormal   Collection Time: 09/29/15  4:08 AM  Result Value Ref Range   WBC 11.1 (H) 3.8 - 10.6 K/uL   RBC 4.02 (L) 4.40 - 5.90 MIL/uL   Hemoglobin 12.6 (L) 13.0 - 18.0 g/dL   HCT 35.9 (L) 40.0 - 52.0 %   MCV 89.3 80.0 - 100.0 fL   MCH 31.4 26.0 - 34.0 pg   MCHC 35.2 32.0 -  36.0 g/dL   RDW 13.7 11.5 - 14.5 %   Platelets 158 150 - 440 K/uL  Troponin I     Status: None   Collection Time: 09/29/15  5:00 AM  Result Value Ref Range   Troponin I 0.03 <0.031 ng/mL    Comment:        NO INDICATION OF MYOCARDIAL INJURY.   Ethanol     Status: None   Collection Time: 09/29/15  5:46 AM  Result Value Ref Range   Alcohol, Ethyl (B) <5 <5 mg/dL    Comment:        LOWEST DETECTABLE LIMIT FOR SERUM ALCOHOL IS 5 mg/dL FOR MEDICAL PURPOSES ONLY   Acetaminophen level     Status: Abnormal   Collection Time: 09/29/15  5:46 AM  Result Value Ref Range   Acetaminophen (Tylenol), Serum <10 (L) 10 - 30 ug/mL    Comment:        THERAPEUTIC CONCENTRATIONS VARY SIGNIFICANTLY. A RANGE OF 10-30 ug/mL MAY BE AN EFFECTIVE CONCENTRATION FOR MANY PATIENTS. HOWEVER, SOME ARE BEST TREATED AT CONCENTRATIONS OUTSIDE THIS RANGE. ACETAMINOPHEN CONCENTRATIONS >150 ug/mL AT 4 HOURS AFTER INGESTION AND >50 ug/mL AT 12 HOURS AFTER INGESTION ARE OFTEN ASSOCIATED WITH TOXIC REACTIONS.   Salicylate level     Status: None   Collection Time: 09/29/15  5:46 AM  Result Value Ref Range   Salicylate Lvl <2.8 2.8 - 30.0 mg/dL  Urine Drug Screen, Qualitative     Status: Abnormal   Collection Time: 09/29/15  6:29 AM  Result Value Ref Range   Tricyclic, Ur Screen NONE DETECTED NONE DETECTED    Amphetamines, Ur Screen NONE DETECTED NONE DETECTED   MDMA (Ecstasy)Ur Screen NONE DETECTED NONE DETECTED   Cocaine Metabolite,Ur [place] NONE DETECTED NONE DETECTED   Opiate, Ur Screen NONE DETECTED NONE DETECTED   Phencyclidine (PCP) Ur S NONE DETECTED NONE DETECTED   Cannabinoid 50 Ng, Ur [place] NONE DETECTED NONE DETECTED   Barbiturates, Ur Screen NONE DETECTED NONE DETECTED   Benzodiazepine, Ur Scrn POSITIVE (A) NONE DETECTED   Methadone Scn, Ur NONE DETECTED NONE DETECTED    Comment: (NOTE) 413  Tricyclics, urine               Cutoff 1000 ng/mL 200  Amphetamines, urine             Cutoff 1000 ng/mL 300  MDMA (Ecstasy), urine           Cutoff 500 ng/mL 400  Cocaine Metabolite, urine       Cutoff 300 ng/mL 500  Opiate, urine                   Cutoff 300 ng/mL 600  Phencyclidine (PCP), urine      Cutoff 25 ng/mL 700  Cannabinoid, urine              Cutoff 50 ng/mL 800  Barbiturates, urine             Cutoff 200 ng/mL 900  Benzodiazepine, urine           Cutoff 200 ng/mL 1000 Methadone, urine                Cutoff 300 ng/mL 1100 1200 The urine drug screen provides only a preliminary, unconfirmed 1300 analytical test result and should not be used for non-medical 1400 purposes. Clinical consideration and professional judgment should 1500 be applied to any positive drug screen result due to possible 1600 interfering substances. A  more specific alternate chemical method 1700 must be used in order to obtain a confirmed analytical result.  1800 Gas chromato graphy / mass spectrometry (GC/MS) is the preferred 1900 confirmatory method.   Lipid panel     Status: Abnormal   Collection Time: 09/29/15  6:58 AM  Result Value Ref Range   Cholesterol 98 0 - 200 mg/dL   Triglycerides 153 (H) <150 mg/dL   HDL 40 (L) >40 mg/dL   Total CHOL/HDL Ratio 2.5 RATIO   VLDL 31 0 - 40 mg/dL   LDL Cholesterol 27 0 - 99 mg/dL    Comment:        Total Cholesterol/HDL:CHD Risk Coronary Heart Disease Risk Table                      Men   Women  1/2 Average Risk   3.4   3.3  Average Risk       5.0   4.4  2 X Average Risk   9.6   7.1  3 X Average Risk  23.4   11.0        Use the calculated Patient Ratio above and the CHD Risk Table to determine the patient's CHD Risk.        ATP III CLASSIFICATION (LDL):  <100     mg/dL   Optimal  100-129  mg/dL   Near or Above                    Optimal  130-159  mg/dL   Borderline  160-189  mg/dL   High  >190     mg/dL   Very High   MRSA PCR Screening     Status: None   Collection Time: 09/29/15  9:00 AM  Result Value Ref Range   MRSA by PCR NEGATIVE NEGATIVE    Comment:        The GeneXpert MRSA Assay (FDA approved for NASAL specimens only), is one component of a comprehensive MRSA colonization surveillance program. It is not intended to diagnose MRSA infection nor to guide or monitor treatment for MRSA infections.   Troponin I     Status: Abnormal   Collection Time: 09/29/15 10:06 AM  Result Value Ref Range   Troponin I 0.07 (H) <0.031 ng/mL    Comment: READ BACK AND VERIFIED WITH TAYLOR FAULKNER AT 6433 ON 09/29/15.Marland KitchenMarland KitchenPatterson Heights        PERSISTENTLY INCREASED TROPONIN VALUES IN THE RANGE OF 0.04-0.49 ng/mL CAN BE SEEN IN:       -UNSTABLE ANGINA       -CONGESTIVE HEART FAILURE       -MYOCARDITIS       -CHEST TRAUMA       -ARRYHTHMIAS       -LATE PRESENTING MYOCARDIAL INFARCTION       -COPD   CLINICAL FOLLOW-UP RECOMMENDED.   Basic metabolic panel     Status: Abnormal   Collection Time: 09/29/15 10:06 AM  Result Value Ref Range   Sodium 135 135 - 145 mmol/L   Potassium 5.4 (H) 3.5 - 5.1 mmol/L   Chloride 103 101 - 111 mmol/L   CO2 26 22 - 32 mmol/L   Glucose, Bld 100 (H) 65 - 99 mg/dL   BUN 39 (H) 6 - 20 mg/dL   Creatinine, Ser 1.33 (H) 0.61 - 1.24 mg/dL   Calcium 8.4 (L) 8.9 - 10.3 mg/dL   GFR calc non Af Amer 53 (L) >60 mL/min  GFR calc Af Amer >60 >60 mL/min    Comment: (NOTE) The eGFR has been calculated using the CKD EPI  equation. This calculation has not been validated in all clinical situations. eGFR's persistently <60 mL/min signify possible Chronic Kidney Disease.    Anion gap 6 5 - 15  Magnesium     Status: None   Collection Time: 09/29/15 10:06 AM  Result Value Ref Range   Magnesium 2.1 1.7 - 2.4 mg/dL  Troponin I     Status: Abnormal   Collection Time: 09/29/15  3:51 PM  Result Value Ref Range   Troponin I 0.06 (H) <0.031 ng/mL    Comment: READ BACK AND VERIFIED WITH TAYLOR FOSTER AT 5102 09/29/15 MLZ        PERSISTENTLY INCREASED TROPONIN VALUES IN THE RANGE OF 0.04-0.49 ng/mL CAN BE SEEN IN:       -UNSTABLE ANGINA       -CONGESTIVE HEART FAILURE       -MYOCARDITIS       -CHEST TRAUMA       -ARRYHTHMIAS       -LATE PRESENTING MYOCARDIAL INFARCTION       -COPD   CLINICAL FOLLOW-UP RECOMMENDED.   Potassium     Status: None   Collection Time: 09/29/15  3:51 PM  Result Value Ref Range   Potassium 4.9 3.5 - 5.1 mmol/L  Troponin I     Status: Abnormal   Collection Time: 09/29/15  9:10 PM  Result Value Ref Range   Troponin I 0.04 (H) <0.031 ng/mL    Comment: PREVIOUS RESULT CALLED AT 1644 09/29/15 ML[facility]        PERSISTENTLY INCREASED TROPONIN VALUES IN THE RANGE OF 0.04-0.49 ng/mL CAN BE SEEN IN:       -UNSTABLE ANGINA       -CONGESTIVE HEART FAILURE       -MYOCARDITIS       -CHEST TRAUMA       -ARRYHTHMIAS       -LATE PRESENTING MYOCARDIAL INFARCTION       -COPD   CLINICAL FOLLOW-UP RECOMMENDED.   Basic metabolic panel     Status: Abnormal   Collection Time: 09/30/15  7:04 AM  Result Value Ref Range   Sodium 137 135 - 145 mmol/L   Potassium 3.8 3.5 - 5.1 mmol/L   Chloride 103 101 - 111 mmol/L   CO2 29 22 - 32 mmol/L   Glucose, Bld 95 65 - 99 mg/dL   BUN 33 (H) 6 - 20 mg/dL   Creatinine, Ser 1.12 0.61 - 1.24 mg/dL   Calcium 8.3 (L) 8.9 - 10.3 mg/dL   GFR calc non Af Amer >60 >60 mL/min   GFR calc Af Amer >60 >60 mL/min    Comment: (NOTE) The eGFR has been calculated using  the CKD EPI equation. This calculation has not been validated in all clinical situations. eGFR's persistently <60 mL/min signify possible Chronic Kidney Disease.    Anion gap 5 5 - 15  CBC     Status: Abnormal   Collection Time: 09/30/15  7:04 AM  Result Value Ref Range   WBC 3.5 (L) 3.8 - 10.6 K/uL   RBC 3.52 (L) 4.40 - 5.90 MIL/uL   Hemoglobin 10.9 (L) 13.0 - 18.0 g/dL   HCT 31.3 (L) 40.0 - 52.0 %   MCV 88.9 80.0 - 100.0 fL   MCH 31.0 26.0 - 34.0 pg   MCHC 34.9 32.0 - 36.0 g/dL   RDW 13.2 11.5 -  14.5 %   Platelets 105 (L) 150 - 440 K/uL    Current Facility-Administered Medications  Medication Dose Route Frequency Provider Last Rate Last Dose  . acetaminophen (TYLENOL) tablet 650 mg  650 mg Oral Q6H PRN Debby Crosley, MD       Or  . acetaminophen (TYLENOL) suppository 650 mg  650 mg Rectal Q6H PRN Debby Crosley, MD      . bumetanide (BUMEX) tablet 1 mg  1 mg Oral Daily Srikar Sudini, MD   1 mg at 09/30/15 1136  . carvedilol (COREG) tablet 3.125 mg  3.125 mg Oral BID WC Debby Crosley, MD   3.125 mg at 09/30/15 1649  . clopidogrel (PLAVIX) tablet 75 mg  75 mg Oral Daily Debby Crosley, MD   75 mg at 09/30/15 0935  . enoxaparin (LOVENOX) injection 40 mg  40 mg Subcutaneous Q24H Debby Crosley, MD   40 mg at 09/30/15 0935  . gabapentin (NEURONTIN) capsule 400 mg  400 mg Oral TID Quintella Baton, MD   400 mg at 09/30/15 1649  . isosorbide mononitrate (IMDUR) 24 hr tablet 60 mg  60 mg Oral Daily Debby Crosley, MD   60 mg at 09/30/15 0935  . levothyroxine (SYNTHROID, LEVOTHROID) tablet 25 mcg  25 mcg Oral Daily Debby Crosley, MD   25 mcg at 09/30/15 0700  . LORazepam (ATIVAN) tablet 1 mg  1 mg Oral BID Debby Crosley, MD   1 mg at 09/30/15 0935  . nitroGLYCERIN (NITROSTAT) SL tablet 0.4 mg  0.4 mg Sublingual Q5 min PRN Debby Crosley, MD      . pantoprazole (PROTONIX) EC tablet 40 mg  40 mg Oral Daily Debby Crosley, MD   40 mg at 09/30/15 0935  . rOPINIRole (REQUIP) tablet 2 mg  2 mg Oral TID  Hillary Bow, MD   2 mg at 09/30/15 1649  . senna-docusate (Senokot-S) tablet 1 tablet  1 tablet Oral QHS PRN Debby Crosley, MD      . sodium chloride flush (NS) 0.9 % injection 3 mL  3 mL Intravenous Q12H Debby Crosley, MD   3 mL at 09/30/15 1607    Musculoskeletal: Strength & Muscle Tone: decreased Gait & Station: unable to stand Patient leans: Backward  Psychiatric Specialty Exam: Physical Exam  Constitutional: He appears well-nourished. He appears lethargic. He appears ill.  HENT:  Head: Normocephalic and atraumatic.  Eyes: Conjunctivae are normal. Pupils are equal, round, and reactive to light.  Neck: Normal range of motion.  Cardiovascular: Normal heart sounds.   Respiratory: Effort normal.  GI: Soft.  Musculoskeletal: Normal range of motion.  Lymphadenopathy:  Patient has extensive lymphedema in his legs  Neurological: He appears lethargic.  Skin: Skin is warm and dry.  Psychiatric: His speech is normal and behavior is normal. Thought content normal. His affect is not blunt. Cognition and memory are normal. He expresses impulsivity. He does not exhibit a depressed mood. He expresses no suicidal ideation.    Review of Systems  Constitutional: Negative.   HENT: Negative.   Eyes: Negative.   Respiratory: Negative.   Cardiovascular: Negative.   Gastrointestinal: Negative.   Musculoskeletal: Negative.   Skin: Negative.   Neurological: Negative.   Psychiatric/Behavioral: Negative for depression, suicidal ideas, hallucinations, memory loss and substance abuse. The patient is not nervous/anxious and does not have insomnia.     Blood pressure 106/63, pulse 65, temperature 97.4 F (36.3 C), temperature source Axillary, resp. rate 18, height '6\' 1"'$  (1.854 m), weight 130 kg (286 lb  9.6 oz), SpO2 98 %.Body mass index is 37.82 kg/(m^2).  General Appearance: Disheveled  Eye Contact:  Good  Speech:  Slow  Volume:  Decreased  Mood:  Euthymic  Affect:  Congruent  Thought Process:   Goal Directed  Orientation:  Full (Time, Place, and Person)  Thought Content:  Logical  Suicidal Thoughts:  No  Homicidal Thoughts:  No  Memory:  Immediate;   Good Recent;   Fair Remote;   Fair  Judgement:  Impaired  Insight:  Fair  Psychomotor Activity:  Decreased  Concentration:  Concentration: Fair  Recall:  [employer] of Knowledge:  Fair  Language:  Fair  Akathisia:  No  Handed:  Right  AIMS (if indicated):     Assets:  Communication Skills Housing Social Support  ADL's:  Impaired  Cognition:  WNL  Sleep:        Treatment Plan Summary: Plan 70 year old man who made a serious attempt to kill himself, the second within about a month, yet who is upbeat smiling pleasant and completely denying suicidal ideation today. He told me that he has been on an experimental medicine from [facility] for the last few months and wonders if that or some other medicine he is taking could be the cause of this. Without knowing more about that specific medicine I do not know. Nonetheless I think that given the seriousness of this suicide attempt he needs to be further evaluated and stabilized on psychiatry. Orders have been in all day to transfer him downstairs. Patient should be transferred to psychiatry as soon his bed is available. He is quite medically stable. I have not restarted any psychiatric medicine.  Disposition: Recommend psychiatric Inpatient admission when medically cleared.  Alethia Berthold, MD 09/30/2015 5:15 PM

## 2015-09-30 NOTE — BH Assessment (Signed)
[facility] Toni Amendlapacs.  Attending Physician will be Dr. Jennet MaduroPucilowska.   Patient [facility] 323[facility] Nurse [place].   Patient has been pre-admitted by Patient Access, Angelique Blonderenise

## 2015-09-30 NOTE — Progress Notes (Signed)
[facility] [place] at [facility]   PATIENT NAME: Jesse PerchesSamuel [name]    MR#:  [id number]  DATE OF BIRTH:  02/24/1946  SUBJECTIVE:  CHIEF COMPLAINT:   Chief Complaint  Patient presents with  . Drug Overdose   Feels well today. No chest pain or shortness of breath.  REVIEW OF SYSTEMS:    Review of Systems  Constitutional: Negative for fever.  HENT: Positive for sore throat.   Eyes: Negative for blurred vision, double vision and pain.  Respiratory: Negative for cough, hemoptysis, shortness of breath and wheezing.   Cardiovascular: Negative for chest pain, palpitations, orthopnea and leg swelling.  Gastrointestinal: Negative for nausea, vomiting, abdominal pain, diarrhea and constipation.  Genitourinary: Negative for dysuria and hematuria.  Musculoskeletal: Negative for back pain and joint pain.  Neurological: Negative for sensory change, speech change and focal weakness.  Psychiatric/Behavioral: Positive for depression. The patient is not nervous/anxious.     DRUG ALLERGIES:   Allergies  Allergen Reactions  . Sulfa Antibiotics Other (See Comments)    hallucinations   . Latex Rash    VITALS:  Blood pressure 117/79, pulse 65, temperature 97.4 F (36.3 C), temperature source Axillary, resp. rate 18, height 6\' 1"  (1.854 m), weight 130 kg (286 lb 9.6 oz), SpO2 96 %.  PHYSICAL EXAMINATION:   Physical Exam  GENERAL:  70 y.o.-year-old patient lying in the bed with no acute distress. Orbital obese EYES: Pupils equal, round, reactive to light and accommodation. No scleral icterus. Extraocular muscles intact.  HEENT: Head atraumatic, normocephalic. Oropharynx and nasopharynx clear.  NECK:  Supple, no jugular venous distention. No thyroid enlargement, no tenderness.  LUNGS: Normal breath sounds bilaterally, no wheezing, rales, rhonchi. No use of accessory muscles of respiration.  CARDIOVASCULAR: S1, S2 normal. No murmurs, rubs, or gallops.  ABDOMEN: Soft,  nontender, nondistended. Bowel sounds present. No organomegaly or mass.  EXTREMITIES: No cyanosis, clubbing. Bilateral lower extremity lymphedema NEUROLOGIC: Cranial nerves II through XII are intact. No focal Motor or sensory deficits b/l.   PSYCHIATRIC: The patient is alert and oriented x 3. Flat affect SKIN: No obvious rash, lesion, or ulcer.   LABORATORY PANEL:   CBC  Recent Labs Lab 09/30/15 0704  WBC 3.5*  HGB 10.9*  HCT 31.3*  PLT 105*   ------------------------------------------------------------------------------------------------------------------ Chemistries   Recent Labs Lab 09/29/15 0408 09/29/15 1006  09/30/15 0704  NA 137 135  --  137  K 5.5* 5.4*  < > 3.8  CL 105 103  --  103  CO2 23 26  --  29  GLUCOSE 100* 100*  --  95  BUN 41* 39*  --  33*  CREATININE 1.22 1.33*  --  1.12  CALCIUM 8.6* 8.4*  --  8.3*  MG  --  2.1  --   --   AST 26  --   --   --   ALT 21  --   --   --   ALKPHOS 71  --   --   --   BILITOT 0.8  --   --   --   < > = values in this interval not displayed. ------------------------------------------------------------------------------------------------------------------  Cardiac Enzymes  Recent Labs Lab 09/29/15 2110  TROPONINI 0.04*   ------------------------------------------------------------------------------------------------------------------  RADIOLOGY:  No results found.   ASSESSMENT AND PLAN:   . Overdose -Monitor in telemetry, one-to-one sitter -Involuntarily committed - Medically cleared to be discharged to behavioral health unit  . CAD (coronary artery disease) Troponin has been normal.  Patient has been seen by cardiology Dr. Welton FlakesKhan. Continue home medications. No further workup needed.  . Essential hypertension -Stable, home medications resumed  . Obstructive sleep apnea -Stable, CPAP at night  . Restless leg syndrome -Stable, monitor  All the records are reviewed and case discussed with Care  Management/Social Workerr. Management plans discussed with the patient, family and they are in agreement.  CODE STATUS: FULL  DVT Prophylaxis: SCDs  TOTAL TIME TAKING CARE OF THIS PATIENT: 40 minutes.    Milagros LollSudini, Mycala Warshawsky R M.D on 09/30/2015 at 9:19 AM  Between 7am to 6pm - Pager - [phone]  After 6pm go to [email or web address] - password [email or web address]  [facility]  Office  [phone]  CC: Primary care physician; [name], MD  Note: This dictation was prepared with Dragon dictation along with smaller phrase technology. Any transcriptional errors that result from this process are unintentional.

## 2015-09-30 NOTE — Pre-Procedure Instructions (Signed)
NOTIFIED SHERRY AT DR Garen LahMILLERS OFFICE THAT PT IN CURRENTLY AN INPATIENT IN CCU-PT CURRENTLY SCHEDULED FOR GANGLION CYST EXCISION ON 10-03-15 WITH DR H MILLER

## 2015-09-30 NOTE — Clinical Documentation Improvement (Signed)
Cardiology Internal Medicine   Can the diagnosis of CHF be further specified? Please document findings in next progress note. Thank you!    Acuity - Acute, Chronic, Acute on Chronic   Type - Systolic, Diastolic, Systolic and Diastolic  Other  Clinically Undetermined  Document any associated diagnoses/conditions  Supporting Information:  ECHO performed on 07/24/15 reveals EF of 40% with diffuse hypokinesis, abnormal left ventricular relaxation, grade 1 diastolic dysfunction  Being treated with Coreg 3.125 mg twice daily  Please exercise your independent, professional judgment when responding. A specific answer is not anticipated or expected.  Thank You,  Shellee MiloEileen T Laquasia Pincus RN, BSN, CCDS Health Information Management [place] [phone]

## 2015-09-30 NOTE — Discharge Instructions (Signed)
Cardiac diet  Activity as tolerated  CPAP at night

## 2015-09-30 NOTE — Progress Notes (Signed)
Pt placed [facility] CPAP C-1. CPAP is plugged into red outlet. Pt tolerating well.

## 2015-10-01 ENCOUNTER — Inpatient Hospital Stay
Admission: RE | Admit: 2015-10-01 | Discharge: 2015-10-05 | DRG: 885 | Disposition: A | Discharge status: Home / Self Care | Payer: Medicare Other | Source: Intra-hospital | Attending: Psychiatry | Admitting: Psychiatry

## 2015-10-01 ENCOUNTER — Encounter: Payer: Self-pay | Admitting: Psychiatry

## 2015-10-01 DIAGNOSIS — Z915 Personal history of self-harm: Secondary | ICD-10-CM

## 2015-10-01 DIAGNOSIS — Z79899 Other long term (current) drug therapy: Secondary | ICD-10-CM | POA: Diagnosis not present

## 2015-10-01 DIAGNOSIS — T50901A Poisoning by unspecified drugs, medicaments and biological substances, accidental (unintentional), initial encounter: Secondary | ICD-10-CM | POA: Diagnosis present

## 2015-10-01 DIAGNOSIS — I509 Heart failure, unspecified: Secondary | ICD-10-CM

## 2015-10-01 DIAGNOSIS — E039 Hypothyroidism, unspecified: Secondary | ICD-10-CM

## 2015-10-01 DIAGNOSIS — T43222A Poisoning by selective serotonin reuptake inhibitors, intentional self-harm, initial encounter: Secondary | ICD-10-CM | POA: Diagnosis present

## 2015-10-01 DIAGNOSIS — I890 Lymphedema, not elsewhere classified: Secondary | ICD-10-CM

## 2015-10-01 DIAGNOSIS — Z882 Allergy status to sulfonamides status: Secondary | ICD-10-CM | POA: Diagnosis not present

## 2015-10-01 DIAGNOSIS — I110 Hypertensive heart disease with heart failure: Secondary | ICD-10-CM | POA: Diagnosis present

## 2015-10-01 DIAGNOSIS — I2510 Atherosclerotic heart disease of native coronary artery without angina pectoris: Secondary | ICD-10-CM | POA: Diagnosis present

## 2015-10-01 DIAGNOSIS — Z9889 Other specified postprocedural states: Secondary | ICD-10-CM

## 2015-10-01 DIAGNOSIS — F322 Major depressive disorder, single episode, severe without psychotic features: Principal | ICD-10-CM | POA: Diagnosis present

## 2015-10-01 DIAGNOSIS — Z726 Gambling and betting: Secondary | ICD-10-CM

## 2015-10-01 DIAGNOSIS — E876 Hypokalemia: Secondary | ICD-10-CM | POA: Diagnosis present

## 2015-10-01 DIAGNOSIS — Z833 Family history of diabetes mellitus: Secondary | ICD-10-CM | POA: Diagnosis not present

## 2015-10-01 DIAGNOSIS — E119 Type 2 diabetes mellitus without complications: Secondary | ICD-10-CM | POA: Diagnosis present

## 2015-10-01 DIAGNOSIS — E538 Deficiency of other specified B group vitamins: Secondary | ICD-10-CM | POA: Diagnosis present

## 2015-10-01 DIAGNOSIS — G4733 Obstructive sleep apnea (adult) (pediatric): Secondary | ICD-10-CM | POA: Diagnosis present

## 2015-10-01 DIAGNOSIS — Z950 Presence of cardiac pacemaker: Secondary | ICD-10-CM | POA: Diagnosis not present

## 2015-10-01 DIAGNOSIS — D509 Iron deficiency anemia, unspecified: Secondary | ICD-10-CM | POA: Diagnosis present

## 2015-10-01 DIAGNOSIS — E559 Vitamin D deficiency, unspecified: Secondary | ICD-10-CM | POA: Diagnosis present

## 2015-10-01 DIAGNOSIS — F1020 Alcohol dependence, uncomplicated: Secondary | ICD-10-CM | POA: Diagnosis present

## 2015-10-01 DIAGNOSIS — Z6837 Body mass index (BMI) 37.0-37.9, adult: Secondary | ICD-10-CM

## 2015-10-01 DIAGNOSIS — Z91040 Latex allergy status: Secondary | ICD-10-CM

## 2015-10-01 DIAGNOSIS — K219 Gastro-esophageal reflux disease without esophagitis: Secondary | ICD-10-CM | POA: Diagnosis present

## 2015-10-01 DIAGNOSIS — G2581 Restless legs syndrome: Secondary | ICD-10-CM | POA: Diagnosis present

## 2015-10-01 DIAGNOSIS — F630 Pathological gambling: Secondary | ICD-10-CM

## 2015-10-01 DIAGNOSIS — D696 Thrombocytopenia, unspecified: Secondary | ICD-10-CM | POA: Diagnosis present

## 2015-10-01 DIAGNOSIS — D649 Anemia, unspecified: Secondary | ICD-10-CM | POA: Diagnosis present

## 2015-10-01 DIAGNOSIS — G8929 Other chronic pain: Secondary | ICD-10-CM | POA: Diagnosis present

## 2015-10-01 DIAGNOSIS — R45851 Suicidal ideations: Secondary | ICD-10-CM | POA: Diagnosis present

## 2015-10-01 DIAGNOSIS — I4891 Unspecified atrial fibrillation: Secondary | ICD-10-CM | POA: Diagnosis present

## 2015-10-01 DIAGNOSIS — G4700 Insomnia, unspecified: Secondary | ICD-10-CM | POA: Diagnosis present

## 2015-10-01 DIAGNOSIS — I10 Essential (primary) hypertension: Secondary | ICD-10-CM | POA: Diagnosis present

## 2015-10-01 DIAGNOSIS — E669 Obesity, unspecified: Secondary | ICD-10-CM | POA: Diagnosis present

## 2015-10-01 DIAGNOSIS — T1491XA Suicide attempt, initial encounter: Secondary | ICD-10-CM | POA: Diagnosis present

## 2015-10-01 LAB — TSH: TSH: 4.639 u[IU]/mL — ABNORMAL HIGH (ref 0.350–4.500)

## 2015-10-01 LAB — HEMOGLOBIN A1C: Hgb A1c MFr Bld: 5.4 % (ref 4.0–6.0)

## 2015-10-01 MED ORDER — BUMETANIDE 1 MG PO TABS
1.0000 mg | ORAL_TABLET | Freq: Every day | ORAL | Status: DC
  Administered 2015-10-01 – 2015-10-03 (×3): 1 mg via ORAL
  Filled 2015-10-01 (×4): qty 1

## 2015-10-01 MED ORDER — SPIRONOLACTONE 25 MG PO TABS
25.0000 mg | ORAL_TABLET | Freq: Every day | ORAL | Status: DC
  Administered 2015-10-01 – 2015-10-05 (×5): 25 mg via ORAL
  Filled 2015-10-01 (×7): qty 1

## 2015-10-01 MED ORDER — TRAZODONE HCL 100 MG PO TABS
100.0000 mg | ORAL_TABLET | Freq: Every evening | ORAL | Status: DC | PRN
  Administered 2015-10-01 – 2015-10-04 (×4): 100 mg via ORAL
  Filled 2015-10-01 (×4): qty 1

## 2015-10-01 MED ORDER — MAGNESIUM HYDROXIDE 400 MG/5ML PO SUSP: 30.0000 mL | Freq: Every day | ORAL | Status: DC | PRN

## 2015-10-01 MED ORDER — PANTOPRAZOLE SODIUM 40 MG PO TBEC
40.0000 mg | DELAYED_RELEASE_TABLET | Freq: Every day | ORAL | Status: DC
  Administered 2015-10-01 – 2015-10-05 (×5): 40 mg via ORAL
  Filled 2015-10-01 (×6): qty 1

## 2015-10-01 MED ORDER — ALUM & MAG HYDROXIDE-SIMETH 200-200-20 MG/5ML PO SUSP: 30.0000 mL | ORAL | Status: DC | PRN

## 2015-10-01 MED ORDER — ISOSORBIDE MONONITRATE ER 30 MG PO TB24
60.0000 mg | ORAL_TABLET | Freq: Every day | ORAL | Status: DC
  Administered 2015-10-01 – 2015-10-05 (×5): 60 mg via ORAL
  Filled 2015-10-01 (×5): qty 2

## 2015-10-01 MED ORDER — VITAMIN B-12 1000 MCG PO TABS
1000.0000 ug | ORAL_TABLET | Freq: Every day | ORAL | Status: DC
  Administered 2015-10-01 – 2015-10-05 (×5): 1000 ug via ORAL
  Filled 2015-10-01 (×5): qty 1

## 2015-10-01 MED ORDER — ACETAMINOPHEN 325 MG PO TABS: 650.0000 mg | ORAL_TABLET | Freq: Four times a day (QID) | ORAL | Status: DC | PRN

## 2015-10-01 MED ORDER — NITROGLYCERIN 0.4 MG SL SUBL: 0.4000 mg | SUBLINGUAL_TABLET | SUBLINGUAL | Status: DC | PRN

## 2015-10-01 MED ORDER — ARIPIPRAZOLE 2 MG PO TABS
2.0000 mg | ORAL_TABLET | Freq: Every day | ORAL | Status: DC
  Administered 2015-10-01 – 2015-10-03 (×3): 2 mg via ORAL
  Filled 2015-10-01 (×3): qty 1

## 2015-10-01 MED ORDER — GABAPENTIN 400 MG PO CAPS
400.0000 mg | ORAL_CAPSULE | Freq: Three times a day (TID) | ORAL | Status: DC
  Administered 2015-10-01 – 2015-10-05 (×13): 400 mg via ORAL
  Filled 2015-10-01 (×13): qty 1

## 2015-10-01 MED ORDER — ROPINIROLE HCL 1 MG PO TABS
2.0000 mg | ORAL_TABLET | Freq: Every day | ORAL | Status: DC
  Administered 2015-10-01 – 2015-10-02 (×2): 2 mg via ORAL
  Filled 2015-10-01 (×2): qty 2

## 2015-10-01 MED ORDER — CLOPIDOGREL BISULFATE 75 MG PO TABS
75.0000 mg | ORAL_TABLET | Freq: Every day | ORAL | Status: DC
  Administered 2015-10-01 – 2015-10-05 (×5): 75 mg via ORAL
  Filled 2015-10-01 (×6): qty 1

## 2015-10-01 MED ORDER — LEVOTHYROXINE SODIUM 25 MCG PO TABS
25.0000 ug | ORAL_TABLET | Freq: Every day | ORAL | Status: DC
  Administered 2015-10-01 – 2015-10-05 (×5): 25 ug via ORAL
  Filled 2015-10-01 (×7): qty 1

## 2015-10-01 NOTE — [facility] Suicide Risk Assessment (Signed)
[facility]   Nursing information obtained from:  Patient, Review of record Demographic factors:  Male, Age 70 or older, Caucasian Current Mental Status:  Suicidal ideation indicated by patient, Self-harm behaviors Loss Factors:  Decline in physical health Historical Factors:  Prior suicide attempts Risk Reduction Factors:  Living with another person, especially a relative, Positive social support  Total Time spent with patient: 1 hour Principal Problem: Severe major depression, single episode, without psychotic features (HCC) Diagnosis:   Patient Active Problem List   Diagnosis Date Noted  . Hypothyroidism [E03.9] 10/01/2015  . B12 deficiency [E53.8] 10/01/2015  . Major depressive disorder, single episode, severe without psychosis (HCC) [F32.2] 10/01/2015  . Overdose [T50.901A] 09/29/2015  . CAD (coronary artery disease) [I25.10] 09/29/2015  . Severe major depression, single episode, without psychotic features (HCC) [F32.2] 09/29/2015  . Suicidal ideation [R45.851] 09/29/2015  . Suicide attempt (HCC) [T14.91] 09/29/2015  . Normochromic normocytic anemia [D64.9]   . Thrombocytopenia (HCC) [D69.6]   . Generalized weakness [R53.1] 07/26/2015  . S/P cardiac catheterization [Z98.890] 07/26/2015  . Obstructive sleep apnea [G47.33] 07/26/2015  . Restless leg syndrome [G25.81] 07/26/2015  . Essential hypertension [I10] 07/26/2015  . Lymphedema [I89.0] 07/26/2015  . Obesity [E66.9] 07/26/2015   Subjective Data: deoression, anxiety, suicide attemot.  Continued Clinical Symptoms:  Alcohol Use Disorder Identification Test Final Score (AUDIT): 0 The "Alcohol Use Disorders Identification Test", Guidelines for Use in Primary Care, Second Edition.  World Science writerHealth Organization [facility](WHO). Score between 0-7:  no or low risk or alcohol related problems. Score between 8-15:  moderate risk of alcohol related problems. Score between 16-19:  high risk of alcohol related  problems. Score 20 or above:  warrants further diagnostic evaluation for alcohol dependence and treatment.   CLINICAL FACTORS:   Severe Anxiety and/or Agitation Depression:   Impulsivity Medical Diagnoses and Treatments/Surgeries   Musculoskeletal: Strength & Muscle Tone: within normal limits Gait & Station: normal Patient leans: N/A  Psychiatric Specialty Exam: Physical Exam  Nursing note and vitals reviewed.   Review of Systems  Psychiatric/Behavioral: Positive for depression. The patient is nervous/anxious.   All other systems reviewed and are negative.   Blood pressure 123/76, pulse 85, temperature 98 F (36.7 C), temperature source Oral, resp. rate 18, height 6\' 1"  (1.854 m), weight 129.275 kg (285 lb).Body mass index is 37.61 kg/(m^2).  General Appearance: Casual  Eye Contact:  Good  Speech:  Clear and Coherent  Volume:  Normal  Mood:  Anxious  Affect:  Appropriate  Thought Process:  Goal Directed  Orientation:  Full (Time, Place, and Person)  Thought Content:  WDL  Suicidal Thoughts:  No  Homicidal Thoughts:  No  Memory:  Immediate;   Fair Recent;   Fair Remote;   Fair  Judgement:  Impaired  Insight:  Present  Psychomotor Activity:  Normal  Concentration:  Concentration: Fair and Attention Span: Fair  Recall:  FiservFair  Fund of Knowledge:  Fair  Language:  Fair  Akathisia:  No  Handed:  Right  AIMS (if indicated):     Assets:  Communication Skills Desire for Improvement Financial Resources/Insurance Housing Resilience Social Support Talents/Skills  ADL's:  Intact  Cognition:  WNL  Sleep:  Number of Hours: 1.75      COGNITIVE FEATURES THAT CONTRIBUTE TO RISK:  None    SUICIDE RISK:   Mild:  Suicidal ideation of limited frequency, intensity, duration, and specificity.  There are no identifiable plans, no associated intent, mild dysphoria and related  symptoms, good self-control (both objective and subjective assessment), few other risk factors, and  identifiable protective factors, including available and accessible social support.  PLAN OF CARE: Hospital admission, medication management, discharge planning.  Jesse [name] is a 70 year old male with history of depression and anxiety transferred from medical floor where he was briefly hospitalized after overdose on citalopram.   1. Suicidal ideation. The patient is able to contract for safety in the hospital.  2. Mood and anxiety. His dose of citalopram was just increased to 40 mg we will continue. We will add Abilify 2 mg for augmentation.  3. Coronary artery disease. He is on Imdur, Nitrostat, Bumex, Aldacton, and Plavix.  4. Hypothyroidism. He is on Synthroid.  5. GERD. He is on Protonix.  6. Restless leg syndrome he is on Requip.  7. B12 deficiency. We continue Vitamin B12 supplementation.  8. Insomnia. Trazodone is available.  9. Chronic pain. He is on Neurontin.   10. Disposition. He will be discharged to home with his family. He will need new appointment with a psychiatrist.  I certify that inpatient services furnished can reasonably be expected to improve the patient's condition.   Jesse LineaJolanta Wilkin Lippy, MD 10/01/2015, 10:15 AM

## 2015-10-01 NOTE — Tx Team (Signed)
Initial Interdisciplinary Treatment Plan   PATIENT STRESSORS: Health problems   PATIENT STRENGTHS: Average or above average intelligence Capable of independent living Supportive family/friends   PROBLEM LIST: Problem List/Patient Goals Date to be addressed Date deferred Reason deferred Estimated date of resolution  Suicidal ideation "If I wanna wack myself I should be able to" 10/01/15     Depression 10/01/15                                                DISCHARGE CRITERIA:  Improved stabilization in mood, thinking, and/or behavior Motivation to continue treatment in a less acute level of care Need for constant or close observation no longer present Reduction of life-threatening or endangering symptoms to within safe limits Verbal commitment to aftercare and medication compliance  PRELIMINARY DISCHARGE PLAN: Attend aftercare/continuing care group Outpatient therapy Return to previous living arrangement  PATIENT/FAMIILY INVOLVEMENT: This treatment plan has been presented to and reviewed with the patient, Cherlynn PerchesSamuel Brandenberger, and/or family member.  The patient and family have been given the opportunity to ask questions and make suggestions.  Yandel Zeiner L Parsells 10/01/2015, 4:24 AM

## 2015-10-01 NOTE — H&P (Addendum)
Psychiatric Admission Assessment Adult  Patient Identification: Ishaan Villamar MRN:  [id number] Date of Evaluation:  10/01/2015 Chief Complaint:  depression  Principal Diagnosis: Severe major depression, single episode, without psychotic features ([place]) Diagnosis:   Patient Active Problem List   Diagnosis Date Noted  . Hypothyroidism [E03.9] 10/01/2015  . B12 deficiency [E53.8] 10/01/2015  . Major depressive disorder, single episode, severe without psychosis ([place]) [F32.2] 10/01/2015  . Overdose [T50.901A] 09/29/2015  . CAD (coronary artery disease) [I25.10] 09/29/2015  . Severe major depression, single episode, without psychotic features ([place]) [F32.2] 09/29/2015  . Suicidal ideation [R45.851] 09/29/2015  . Suicide attempt ([place]) [T14.91] 09/29/2015  . Normochromic normocytic anemia [D64.9]   . Thrombocytopenia ([place]) [D69.6]   . Generalized weakness [R53.1] 07/26/2015  . S/P cardiac catheterization [Z98.890] 07/26/2015  . Obstructive sleep apnea [G47.33] 07/26/2015  . Restless leg syndrome [G25.81] 07/26/2015  . Essential hypertension [I10] 07/26/2015  . Lymphedema [I89.0] 07/26/2015  . Obesity [E66.9] 07/26/2015   History of Present Illness:   Mr. Krauter is a 70 year old male with a history of anxiety and depression.  Chief complaint. "I don't know why I did it."  History of present illness. Information was obtained from the patient and the chart. Mr. Ramakrishnan is a history of depression and anxiety and has been treated with Celexa for about a year with improvement. In December 2016 he relocated from [place] to our area to be closer to the family. As a result, he had to give up woodworking as he no longer has a shop. Also 6 months ago he started experimental treatment for congestive heart failure at [facility]. He was prescribed to experimental medications and names of which he does not remember and they are not on the list of his medications that he believes made his depression and anxiety  worse. He denies frank symptoms of depression but reports periods of "melancjoly", especially in the evening when there is nothing for him to do. During such melancholy feeling he impulsively overdosed on citalopram. His wife noticed an empty pill box and brought to the hospital. He was briefly hospitalized on the medical floor and transferred to psychiatry for further treatment. The patient is glad to be alive and points out how lucky he is in life. He is financially stable, has a good wife and children, has new granddaughter. He volunteers at the hospice. He stays busy all day long. In spite of her problems he considers himself relatively healthy 70 year old. He is absolutely no explanation for his action. He regrets it and hopes that he will never do it again. He denies severe anxiety and thinks that citalopram has been very helpful. He denies psychotic symptoms or symptoms suggestive of bipolar mania. There is no alcohol or drug problems.  Past psychiatric history. Nothing before current depressive episode by the patient. His wife informed us that the patient attempted suicide recently at least twice. Once by cutting, then by overdose. She removed handguns fro the house. For the past week, every night she had to "talk him out" of suicide, not very successfully.  Family psychiatric history. None reported.  Social history. He is originally from our area. For the past 14 years he lives in [place] and relocated back in December 2016. He lives with his wife. He is retired. He has insurance.  Total Time spent with patient: 1 hour  Is the patient at risk to self? No.  Has the patient been a risk to self in the past 6 months? Yes.    Has  the patient been a risk to self within the distant past? No.  Is the patient a risk to others? No.  Has the patient been a risk to others in the past 6 months? No.  Has the patient been a risk to others within the distant past? No.   Prior Inpatient Therapy:   Prior  Outpatient Therapy:    Alcohol Screening: 1. How often do you have a drink containing alcohol?: Never 2. How many drinks containing alcohol do you have on a typical day when you are drinking?: 1 or 2 3. How often do you have six or more drinks on one occasion?: Never Preliminary Score: 0 4. How often during the last year have you found that you were not able to stop drinking once you had started?: Never 5. How often during the last year have you failed to do what was normally expected from you becasue of drinking?: Never 6. How often during the last year have you needed a first drink in the morning to get yourself going after a heavy drinking session?: Never 7. How often during the last year have you had a feeling of guilt of remorse after drinking?: Never 8. How often during the last year have you been unable to remember what happened the night before because you had been drinking?: Never 9. Have you or someone else been injured as a result of your drinking?: No 10. Has a relative or friend or a doctor or another health worker been concerned about your drinking or suggested you cut down?: No Alcohol Use Disorder Identification Test Final Score (AUDIT): 0 Brief Intervention: AUDIT score less than 7 or less-screening does not suggest unhealthy drinking-brief intervention not indicated Substance Abuse History in the last 12 months:  No. Consequences of Substance Abuse: NA Previous Psychotropic Medications: Yes  Psychological Evaluations: No  Past Medical History:  Past Medical History  Diagnosis Date  . Hypertension   . Diabetes mellitus without complication ([place])   . CHF (congestive heart failure) ([place])   . CAD (coronary artery disease)   . A-fib ([place])   . Sleep apnea   . Chronic edema     Past Surgical History  Procedure Laterality Date  . Pacemaker insertion    . Cardiac catheterization Right 07/25/2015    Procedure: Left Heart Cath and Coronary Angiography;  Surgeon: Dionisio David, MD;  Location: [facility];  Service: Cardiovascular;  Laterality: Right;   Family History:  Family History  Problem Relation Age of Onset  . Diabetes Brother    Tobacco Screening: _0 [phone])::1)@ Social History:  History  Alcohol Use No     History  Drug Use No    Additional Social History:      Pain Medications: see PTA meds Prescriptions: see PTA meds Over the Counter: see PTA meds History of alcohol / drug use?: No history of alcohol / drug abuse                    Allergies:   Allergies  Allergen Reactions  . Sulfa Antibiotics Other (See Comments)    hallucinations   . Latex Rash   Lab Results:  Results for orders placed or performed during the hospital encounter of 09/29/15 (from the past 48 hour(s))  Troponin I     Status: Abnormal   Collection Time: 09/29/15  3:51 PM  Result Value Ref Range   Troponin I 0.06 (H) <0.031 ng/mL    Comment: READ BACK AND  VERIFIED WITH TAYLOR FOSTER AT 7588 09/29/15 MLZ        PERSISTENTLY INCREASED TROPONIN VALUES IN THE RANGE OF 0.04-0.49 ng/mL CAN BE SEEN IN:       -UNSTABLE ANGINA       -CONGESTIVE HEART FAILURE       -MYOCARDITIS       -CHEST TRAUMA       -ARRYHTHMIAS       -LATE PRESENTING MYOCARDIAL INFARCTION       -COPD   CLINICAL FOLLOW-UP RECOMMENDED.   Potassium     Status: None   Collection Time: 09/29/15  3:51 PM  Result Value Ref Range   Potassium 4.9 3.5 - 5.1 mmol/L  Troponin I     Status: Abnormal   Collection Time: 09/29/15  9:10 PM  Result Value Ref Range   Troponin I 0.04 (H) <0.031 ng/mL    Comment: PREVIOUS RESULT CALLED AT 1644 09/29/15 MLZ        PERSISTENTLY INCREASED TROPONIN VALUES IN THE RANGE OF 0.04-0.49 ng/mL CAN BE SEEN IN:       -UNSTABLE ANGINA       -CONGESTIVE HEART FAILURE       -MYOCARDITIS       -CHEST TRAUMA       -ARRYHTHMIAS       -LATE PRESENTING MYOCARDIAL INFARCTION       -COPD   CLINICAL FOLLOW-UP RECOMMENDED.   Basic metabolic panel      Status: Abnormal   Collection Time: 09/30/15  7:04 AM  Result Value Ref Range   Sodium 137 135 - 145 mmol/L   Potassium 3.8 3.5 - 5.1 mmol/L   Chloride 103 101 - 111 mmol/L   CO2 29 22 - 32 mmol/L   Glucose, Bld 95 65 - 99 mg/dL   BUN 33 (H) 6 - 20 mg/dL   Creatinine, Ser 1.12 0.61 - 1.24 mg/dL   Calcium 8.3 (L) 8.9 - 10.3 mg/dL   GFR calc non Af Amer >60 >60 mL/min   GFR calc Af Amer >60 >60 mL/min    Comment: (NOTE) The eGFR has been calculated using the CKD EPI equation. This calculation has not been validated in all clinical situations. eGFR's persistently <60 mL/min signify possible Chronic Kidney Disease.    Anion gap 5 5 - 15  CBC     Status: Abnormal   Collection Time: 09/30/15  7:04 AM  Result Value Ref Range   WBC 3.5 (L) 3.8 - 10.6 K/uL   RBC 3.52 (L) 4.40 - 5.90 MIL/uL   Hemoglobin 10.9 (L) 13.0 - 18.0 g/dL   HCT 31.3 (L) 40.0 - 52.0 %   MCV 88.9 80.0 - 100.0 fL   MCH 31.0 26.0 - 34.0 pg   MCHC 34.9 32.0 - 36.0 g/dL   RDW 13.2 11.5 - 14.5 %   Platelets 105 (L) 150 - 440 K/uL    Blood Alcohol level:  Lab Results  Component Value Date   ETH <5 09/29/2015   ETH <5 32/54/9826    Metabolic Disorder Labs:  No results found for: HGBA1C, MPG No results found for: PROLACTIN Lab Results  Component Value Date   CHOL 98 09/29/2015   TRIG 153* 09/29/2015   HDL 40* 09/29/2015   CHOLHDL 2.5 09/29/2015   VLDL 31 09/29/2015   LDLCALC 27 09/29/2015    Current Medications: Current Facility-Administered Medications  Medication Dose Route Frequency Provider Last Rate Last Dose  . acetaminophen (TYLENOL) tablet 650 mg  650  mg Oral Q6H PRN  B , MD      . alum & mag hydroxide-simeth (MAALOX/MYLANTA) 200-200-20 MG/5ML suspension 30 mL  30 mL Oral Q4H PRN  B , MD      . ARIPiprazole (ABILIFY) tablet 2 mg  2 mg Oral Daily  B , MD      . bumetanide (BUMEX) tablet 1 mg  1 mg Oral Daily  B , MD   1 mg at  10/01/15 0934  . clopidogrel (PLAVIX) tablet 75 mg  75 mg Oral Daily  B , MD   75 mg at 10/01/15 0935  . gabapentin (NEURONTIN) capsule 400 mg  400 mg Oral TID Clovis Fredrickson, MD   400 mg at 10/01/15 0920  . isosorbide mononitrate (IMDUR) 24 hr tablet 60 mg  60 mg Oral Daily  B , MD   60 mg at 10/01/15 0919  . levothyroxine (SYNTHROID, LEVOTHROID) tablet 25 mcg  25 mcg Oral QAC breakfast Clovis Fredrickson, MD   25 mcg at 10/01/15 5400  . magnesium hydroxide (MILK OF MAGNESIA) suspension 30 mL  30 mL Oral Daily PRN  B , MD      . nitroGLYCERIN (NITROSTAT) SL tablet 0.4 mg  0.4 mg Sublingual Q5 min PRN  B , MD      . pantoprazole (PROTONIX) EC tablet 40 mg  40 mg Oral QAC breakfast Clovis Fredrickson, MD   40 mg at 10/01/15 8676  . rOPINIRole (REQUIP) tablet 2 mg  2 mg Oral QHS  B , MD      . spironolactone (ALDACTONE) tablet 25 mg  25 mg Oral Daily  B , MD   25 mg at 10/01/15 0934  . traZODone (DESYREL) tablet 100 mg  100 mg Oral QHS PRN  B , MD      . vitamin B-12 (CYANOCOBALAMIN) tablet 1,000 mcg  1,000 mcg Oral Daily Clovis Fredrickson, MD   1,000 mcg at 10/01/15 0920   PTA Medications: Prescriptions prior to admission  Medication Sig Dispense Refill Last Dose  . Ascorbic Acid (VITAMIN C) 1000 MG tablet Take 1,000 mg by mouth daily.   09/28/2015 at am  . bumetanide (BUMEX) 1 MG tablet Take 1 tablet (1 mg total) by mouth daily. Or as directed by cardiologist (Patient taking differently: Take 2 mg by mouth daily. Or as directed by cardiologist)  0 09/28/2015 at am  . Cholecalciferol (VITAMIN D3) 2000 units TABS Take 2,000 Units by mouth daily.   09/28/2015 at am  . clopidogrel (PLAVIX) 75 MG tablet Take 1 tablet (75 mg total) by mouth daily. 30 tablet 6 09/28/2015 at 0900  . ferrous sulfate 325 (65 FE) MG tablet Take 650 mg by mouth 2 (two) times daily.    09/28/2015 at  pm  . gabapentin (NEURONTIN) 400 MG capsule Take 1 capsule by mouth 3 (three) times daily as needed (for pain).   0 PRN  . isosorbide mononitrate (IMDUR) 60 MG 24 hr tablet Take 60 mg by mouth daily.   09/28/2015 at 0900  . levothyroxine (SYNTHROID, LEVOTHROID) 25 MCG tablet Take 25 mcg by mouth daily.  0 09/28/2015 at am  . LORazepam (ATIVAN) 1 MG tablet Take 1 mg by mouth at bedtime.   0 09/27/2015 at pm  . nitroGLYCERIN (NITROSTAT) 0.4 MG SL tablet Place 0.4 mg under the tongue every 5 (five) minutes as needed for chest pain.   PRN  . pantoprazole (PROTONIX) 40 MG tablet Take 40 mg  by mouth daily.  0 09/28/2015 at am  . potassium chloride SA (K-DUR,KLOR-CON) 20 MEQ tablet Take 1 tablet (20 mEq total) by mouth daily.   09/28/2015 at am  . rOPINIRole (REQUIP) 2 MG tablet Take 4 mg by mouth at bedtime.   0 09/27/2015 at pm  . spironolactone (ALDACTONE) 25 MG tablet Take 25 mg by mouth daily.  0 09/28/2015 at am  . vitamin B-12 (CYANOCOBALAMIN) 1000 MCG tablet Take 1,000 mcg by mouth daily.   09/28/2015 at am    Musculoskeletal: Strength & Muscle Tone: within normal limits Gait & Station: normal Patient leans: N/A  Psychiatric Specialty Exam: I reviewed physical exam performed on the medical floor and agree with the findings. Physical Exam  Nursing note and vitals reviewed.   Review of Systems  Psychiatric/Behavioral: Positive for depression. The patient is nervous/anxious.   All other systems reviewed and are negative.   Blood pressure 123/76, pulse 85, temperature 98 F (36.7 C), temperature source Oral, resp. rate 18, height 6' 1" (1.854 m), weight 129.275 kg (285 lb).Body mass index is 37.61 kg/(m^2).  See SRA.                                                  Sleep:  Number of Hours: 1.75   Treatment Plan Summary: Daily contact with patient to assess and evaluate symptoms and progress in treatment and Medication management   Mr. Knotek is a 70 year old male with  history of depression and anxiety transferred from medical floor where he was briefly hospitalized after overdose on citalopram.   1. Suicidal ideation. The patient is able to contract for safety in the hospital.  2. Mood and anxiety. His dose of citalopram was just increased to 40 mg we will continue. We will add Abilify 2 mg for augmentation.  3. Coronary artery disease. He is on Imdur, Nitrostat, Bumex, Aldacton, and Plavix.  4. Hypothyroidism. He is on Synthroid.  5. GERD. He is on Protonix.  6. Restless leg syndrome he is on Requip.  7. B12 deficiency. We continue Vitamin B12 supplementation.  8. Insomnia. Trazodone is available.  9. Chronic pain. He is on Neurontin.   10. Sleep apnea. He has CPAP machine at night.   11. Metabolic syndrome screening. Lipid profile is elevated. No clubbing A1c, TSH and prolactin are pending.   12. Disposition. He will be discharged to home with his family. He will need new appointment with a psychiatrist.    Observation Level/Precautions:  15 minute checks  Laboratory:  CBC Chemistry Profile UDS UA  Psychotherapy:    Medications:    Consultations:    Discharge Concerns:    Estimated LOS:  Other:     I certify that inpatient services furnished can reasonably be expected to improve the patient's condition.    Orson Slick, MD 6/24/201710:38 AM

## 2015-10-01 NOTE — Progress Notes (Signed)
Pt has been pleasant and cooperative. Pt denies SI and A/V hallucinations.Pt has been active on the unit with limited verbal interactions. Pt has been med compliant. Pt ambulating fairly well on the unit.

## 2015-10-01 NOTE — [facility] Group Notes (Signed)
[facility] LCSW Group Therapy  10/01/2015 3:12 PM  Type of Therapy:  Group Therapy  Participation Level:  Minimal  Participation Quality:  Attentive  Affect:  Appropriate  Cognitive:  Alert  Insight:  Limited   Engagement in Therapy:  Limited   Modes of Intervention:  Discussion, Education, Socialization and Support  Summary of Progress/Problems: Pt will identify unhealthy thoughts and how they impact their emotions and behavior. Pt will be encouraged to discuss these thoughts, emotions and behaviors with the group. Pt attended group and stayed the entire time. Pt sat quietly and listened to other group members share.   Khyron Garno L Brieann Osinski MSW, LCSWA  10/01/2015, 3:12 PM

## 2015-10-01 NOTE — Progress Notes (Signed)
Patient ID: Jesse [name], male   DOB: 12/13/1945, 70 y.o.   MRN: [id number]  Pt admitted to unit. He is alert and oriented x4. Pt reports that he "took a bunch of pills." He does report that his was a suicide attempt. Denies SI at this time, however pt states "I don't know why they brought me here. If someone wants to wack themselves they should be able to." Denies HI/AVH at this time. Denies pain. Skin assessment performed and no contraband found. Pt is noted to have bruises on bilateral arms, back, and abdomen. A scar is noted on his chest from pacemaker placement. Pt's lower extremities are extremely swollen. Pulses are not palpable.  Pt provided with a wheelchair due to inability to walk from swelling. Pt educated on high fall risk status. Pt does have a sitter at bedside for safety and CPAP use. Pt oriented to unit. q15 minute safety checks maintained. Pt remains free from harm. Will continue to monitor.

## 2015-10-02 LAB — PROLACTIN: PROLACTIN: 15.6 ng/mL — AB (ref 4.0–15.2)

## 2015-10-02 MED ORDER — TRAZODONE HCL 100 MG PO TABS: 100.0000 mg | ORAL_TABLET | Freq: Every evening | ORAL | Status: DC | PRN

## 2015-10-02 MED ORDER — ARIPIPRAZOLE 2 MG PO TABS: 2.0000 mg | ORAL_TABLET | Freq: Every day | ORAL | Status: DC

## 2015-10-02 NOTE — Progress Notes (Signed)
D: Pt is pleasant and cooperative this evening. He is seen in the milieu interacting appropriately with staff and peers. Denies SI/HI/AVH at this time. Denies pain. Pt gait is steady and he ambulates without difficulty. He rates depression and anxiety 0/10. Sleeps with CPAP. A: Emotional support and encouragement provided. Medications administered with education. q15 minute safety checks maintained. R: Pt remains free from harm. Will continue to monitor.

## 2015-10-02 NOTE — Progress Notes (Signed)
Pt has been pleasant and cooperative. Pt denies SI and A/V hallucinations.Pt has been active on the unit with limited verbal interactions. Pt has been med compliant. Pt ambulating fairly well on the unit.Pt became upset when told he was not being discharged today. Pt isolated himself briefly, but was able to rejoin his peers, no acting out noted.

## 2015-10-02 NOTE — [facility] Suicide Risk Assessment (Signed)
[facility] INPATIENT:  Family/Significant Other Suicide Prevention Education  Suicide Prevention Education:  Education Completed; Jesse SayersDonna [name] (wife) [phone] has been identified by the patient as the family member/significant other with whom the patient will be residing, and identified as the person(s) who will aid the patient in the event of a mental health crisis (suicidal ideations/suicide attempt).  With written consent from the patient, the family member/significant other has been provided the following suicide prevention education, prior to the and/or following the discharge of the patient.  The suicide prevention education provided includes the following:  Suicide risk factors  Suicide prevention and interventions  National Suicide Hotline telephone number  [facility] assessment telephone number  [facility] Assistance 911  [facility] and/or Residential Mobile Crisis Unit telephone number  Request made of family/significant other to:  Remove weapons (e.g., guns, rifles, knives), all items previously/currently identified as safety concern.    Remove drugs/medications (over-the-counter, prescriptions, illicit drugs), all items previously/currently identified as a safety concern.  The family member/significant other verbalizes understanding of the suicide prevention education information provided.  The family member/significant other agrees to remove the items of safety concern listed above. Pt's wife states she removed the weapons from the home.   Jesse [name] L Jesse [name] MSW, LCSWA  10/02/2015, 10:45 AM

## 2015-10-02 NOTE — Plan of Care (Signed)
Problem: Education: Goal: Emotional status will improve Outcome: Progressing Pt affect is much brighter this evening. He rates depression 0/10 and denies SI.  Problem: Medication: Goal: Compliance with prescribed medication regimen will improve Outcome: Progressing Pt taking medications as prescribed.

## 2015-10-02 NOTE — [facility] Group Notes (Signed)
[facility] Group Notes:  (Nursing/MHT/Case Management/Adjunct)  Date:  10/02/2015  Time:  1:58 AM  Type of Therapy:  Group Therapy  Participation Level:  Active  Participation Quality:  Appropriate  Affect:  Appropriate  Cognitive:  Appropriate  Insight:  Good  Engagement in Group:  Engaged  Modes of Intervention:  n/a  Summary of Progress/Problems:  Veva Holesshley Imani Elizaveta Mattice 10/02/2015, 1:58 AM

## 2015-10-02 NOTE — [facility] Suicide Risk Assessment (Signed)
[facility]   Principal Problem: Major depressive disorder, single episode, severe without psychosis (HCC) Discharge Diagnoses:  Patient Active Problem List   Diagnosis Date Noted  . Hypothyroidism [E03.9] 10/01/2015  . B12 deficiency [E53.8] 10/01/2015  . Major depressive disorder, single episode, severe without psychosis (HCC) [F32.2] 10/01/2015  . Overdose [T50.901A] 09/29/2015  . CAD (coronary artery disease) [I25.10] 09/29/2015  . Severe major depression, single episode, without psychotic features (HCC) [F32.2] 09/29/2015  . Suicidal ideation [R45.851] 09/29/2015  . Suicide attempt (HCC) [T14.91] 09/29/2015  . Normochromic normocytic anemia [D64.9]   . Thrombocytopenia (HCC) [D69.6]   . Generalized weakness [R53.1] 07/26/2015  . S/P cardiac catheterization [Z98.890] 07/26/2015  . Obstructive sleep apnea [G47.33] 07/26/2015  . Restless leg syndrome [G25.81] 07/26/2015  . Essential hypertension [I10] 07/26/2015  . Lymphedema [I89.0] 07/26/2015  . Obesity [E66.9] 07/26/2015    Total Time spent with patient: 30 minutes  Musculoskeletal: Strength & Muscle Tone: within normal limits Gait & Station: normal Patient leans: N/A  Psychiatric Specialty Exam: Review of Systems  Psychiatric/Behavioral: Positive for depression.  All other systems reviewed and are negative.   Blood pressure 120/68, pulse 75, temperature 98.1 F (36.7 C), temperature source Oral, resp. rate 18, height 6\' 1"  (1.854 m), weight 129.275 kg (285 lb).Body mass index is 37.61 kg/(m^2).  General Appearance: Casual  Eye Contact::  Good  Speech:  Clear and Coherent409  Volume:  Normal  Mood:  Euthymic  Affect:  Appropriate  Thought Process:  Goal Directed  Orientation:  Full (Time, Place, and Person)  Thought Content:  WDL  Suicidal Thoughts:  No  Homicidal Thoughts:  No  Memory:  Immediate;   Fair Recent;   Fair Remote;   Fair  Judgement:  Impaired  Insight:  Shallow   Psychomotor Activity:  Normal  Concentration:  Fair  Recall:  [employer]  Fund of Knowledge:Fair  Language: Fair  Akathisia:  No  Handed:  Right  AIMS (if indicated):     Assets:  Communication Skills Desire for Improvement Financial Resources/Insurance Housing Physical Health Resilience Social Support Talents/Skills Transportation  Sleep:  Number of Hours: 5.25  Cognition: WNL  ADL's:  Intact   Mental Status Per Nursing Assessment::   On Admission:  Suicidal ideation indicated by patient, Self-harm behaviors  Demographic Factors:  Male and Caucasian  Loss Factors: NA  Historical Factors: Impulsivity  Risk Reduction Factors:   Sense of responsibility to family, Living with another person, especially a relative, Positive social support and Positive therapeutic relationship  Continued Clinical Symptoms:  Depression:   Impulsivity  Cognitive Features That Contribute To Risk:  None    Suicide Risk:  Minimal: No identifiable suicidal ideation.  Patients presenting with no risk factors but with morbid ruminations; may be classified as minimal risk based on the severity of the depressive symptoms    Plan Of Care/Follow-up recommendations:  Activity:  As tolerated. Diet:  Low sodium heart healthy. Other:  Keep follow-up appointments.  Kristine LineaJolanta Alem Fahl, MD 10/02/2015, 9:38 AM

## 2015-10-02 NOTE — [facility] Counselor (Signed)
Adult Comprehensive Assessment  Patient ID: Jesse [name], male   DOB: 10/14/1945, 70 y.o.   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  Family Relationships: None reported  Financial / Lack of resources (include bankruptcy): None reported  Housing / Lack of housing: Pt lives with wife.  Physical health (include injuries & life threatening diseases): Pt had a stroke in 1998 and congestive heart failure.  Social relationships: None reported  Substance abuse: None reported  Bereavement / Loss: Pt lost parents, sister and daughter.   Living/Environment/Situation:  Living Arrangements: Spouse/significant other Living conditions (as described by patient or guardian): Pt lives with wife.  How long has patient lived in current situation?: "a long time."  What is atmosphere in current home: Loving, Supportive, Comfortable  Family History:  Marital status: Married Number of Years Married: 42 What types of issues is patient dealing with in the relationship?: None reported  Are you sexually active?: Yes What is your sexual orientation?: Heterosexual  Has your sexual activity been affected by drugs, alcohol, medication, or emotional stress?: None reported  Does patient have children?: Yes How many children?: 3 How is patient's relationship with their children?: 1 daughter passed 9 years ago, good relationship with daughters  Childhood History:  By whom was/is the patient raised?: Both parents Description of patient's relationship with caregiver when they were a child: "wonderful."  Patient's description of current relationship with people who raised him/her: Mom passed in 2002, dad passed in 1992 How were you disciplined when you got in trouble as a child/adolescent?: None reported  Does patient have siblings?: Yes Number of Siblings:  2 Description of patient's current relationship with siblings: brother, good relationship; sister pased  Did patient suffer any verbal/emotional/physical/sexual abuse as a child?: No Did patient suffer from severe childhood neglect?: No Has patient ever been sexually abused/assaulted/raped as an adolescent or adult?: No Was the patient ever a victim of a crime or a disaster?: No Witnessed domestic violence?: No Has patient been effected by domestic violence as an adult?: No  Education:  Highest grade of school patient has completed: high school  Currently a Consulting civil engineerstudent?: No Learning disability?: No  Employment/Work Situation:   Employment situation:  (Retired ) Patient's job has been impacted by current illness: No What is the longest time patient has a held a job?: 33 years  Where was the patient employed at that time?: [employer]  Has patient ever been in the [employer] and Companymilitary?: No  Financial Resources:   Surveyor, quantityinancial resources: [employer] (Retirement ) Does patient have a Lawyerrepresentative payee or guardian?: No  Alcohol/Substance Abuse:   What has been your use of drugs/alcohol within the last 12 months?: None reported  If attempted suicide, did drugs/alcohol play a role in this?: No Alcohol/Substance Abuse Treatment Hx: Denies past history Has alcohol/substance abuse ever caused legal problems?: No  Social Support System:   Conservation officer, natureatient's Community Support System: Good Describe Community Support System: family Type of faith/religion: Christianity  How does patient's faith help to cope with current illness?: prayer   Leisure/Recreation:   Leisure and Hobbies: volunteering, wood working   Strengths/Needs:   What things does the patient do well?: woodworking In what areas does patient struggle / problems for patient: golfing   Discharge Plan:   Does patient have access to transportation?: Yes Will patient be returning to same living situation after discharge?: Yes Currently receiving  community  mental health services: Yes (From Whom) ([facility] ) Does patient have financial barriers related to discharge medications?: No  Summary/Recommendations:    Patient is a 70 year old male admitted  with a diagnosis of Major Deperssion. Patient presented to the hospital with depression, and SI. Patient was unable to identify triggers for admission. Patient will benefit from crisis stabilization, medication evaluation, group therapy and psycho education in addition to case management for discharge. At discharge, it is recommended that patient remain compliant with established discharge plan and continued treatment.   Sempra EnergyCandace L Arisha Gervais MSW, LCSWA . 10/02/2015

## 2015-10-02 NOTE — Progress Notes (Signed)
[facility]  10/02/2015 11:11 AM Cherlynn PerchesSamuel Korb  MRN:  [id number]  Subjective:  We tended to discharge Mr. Kizzie BaneHughes to home today but after talking to his wife we realize that his problems may be deeper. Not only the patient drinks daily heavy but he attempted suicide several times once by cutting and other times by medication overdose. He never went to the hospital for it. For the past week at the ninth the patient wanted his wife to talk him out all suicide attempt. She removed the guns from the house already. But she is unable to manage his medications. He reportedly has bags and bags of medicines that he administers himself. The patient is rather upset when he learned that he will stay in the hospital for longer. He believes that his wife and his children exaggerate. He himself denies any symptoms of depression, anxiety, psychosis, drinking or suicide attempts.  Principal Problem: Major depressive disorder, single episode, severe without psychosis (HCC) Diagnosis:   Patient Active Problem List   Diagnosis Date Noted  . Hypothyroidism [E03.9] 10/01/2015  . B12 deficiency [E53.8] 10/01/2015  . Major depressive disorder, single episode, severe without psychosis (HCC) [F32.2] 10/01/2015  . Overdose [T50.901A] 09/29/2015  . CAD (coronary artery disease) [I25.10] 09/29/2015  . Severe major depression, single episode, without psychotic features (HCC) [F32.2] 09/29/2015  . Suicidal ideation [R45.851] 09/29/2015  . Suicide attempt (HCC) [T14.91] 09/29/2015  . Normochromic normocytic anemia [D64.9]   . Thrombocytopenia (HCC) [D69.6]   . Generalized weakness [R53.1] 07/26/2015  . S/P cardiac catheterization [Z98.890] 07/26/2015  . Obstructive sleep apnea [G47.33] 07/26/2015  . Restless leg syndrome [G25.81] 07/26/2015  . Essential hypertension [I10] 07/26/2015  . Lymphedema [I89.0] 07/26/2015  . Obesity [E66.9] 07/26/2015   Total Time spent with patient: 20 minutes  Past Psychiatric  History: Depression.  Past Medical History:  Past Medical History  Diagnosis Date  . Hypertension   . Diabetes mellitus without complication (HCC)   . CHF (congestive heart failure) (HCC)   . CAD (coronary artery disease)   . A-fib (HCC)   . Sleep apnea   . Chronic edema     Past Surgical History  Procedure Laterality Date  . Pacemaker insertion    . Cardiac catheterization Right 07/25/2015    Procedure: Left Heart Cath and Coronary Angiography;  Surgeon: Laurier NancyShaukat A Khan, MD;  Location: [facility] INVASIVE CV LAB;  Service: Cardiovascular;  Laterality: Right;   Family History:  Family History  Problem Relation Age of Onset  . Diabetes Brother    Family Psychiatric  History: See H&P. Social History:  History  Alcohol Use No     History  Drug Use No    Social History   Social History  . Marital Status: Married    Spouse Name: N/A  . Number of Children: N/A  . Years of Education: N/A   Occupational History  . retired    Social History Main Topics  . Smoking status: Former Games developermoker  . Smokeless tobacco: None  . Alcohol Use: No  . Drug Use: No  . Sexual Activity: Not Asked   Other Topics Concern  . None   Social History Narrative   Additional Social History:    Pain Medications: see PTA meds Prescriptions: see PTA meds Over the Counter: see PTA meds History of alcohol / drug use?: No history of alcohol / drug abuse  Sleep: Fair  Appetite:  Fair  Current Medications: Current Facility-Administered Medications  Medication Dose Route Frequency Provider Last Rate Last Dose  . acetaminophen (TYLENOL) tablet 650 mg  650 mg Oral Q6H PRN Keyari Kleeman B Dejour Vos, MD      . alum & mag hydroxide-simeth (MAALOX/MYLANTA) 200-200-20 MG/5ML suspension 30 mL  30 mL Oral Q4H PRN Teddy Pena B Oaklee Sunga, MD      . ARIPiprazole (ABILIFY) tablet 2 mg  2 mg Oral Daily Bettyanne Dittman B Kreg Earhart, MD   2 mg at 10/02/15 0841  . bumetanide (BUMEX) tablet 1 mg  1 mg Oral  Daily Shari ProwsJolanta B Manha Amato, MD   1 mg at 10/02/15 [id number]  . clopidogrel (PLAVIX) tablet 75 mg  75 mg Oral Daily Nolie Bignell B Courtnei Ruddell, MD   75 mg at 10/02/15 0840  . gabapentin (NEURONTIN) capsule 400 mg  400 mg Oral TID Shari ProwsJolanta B Farrah Skoda, MD   400 mg at 10/02/15 0840  . isosorbide mononitrate (IMDUR) 24 hr tablet 60 mg  60 mg Oral Daily Shaylinn Hladik B Alta Goding, MD   60 mg at 10/02/15 0841  . levothyroxine (SYNTHROID, LEVOTHROID) tablet 25 mcg  25 mcg Oral QAC breakfast Shari ProwsJolanta B Yaniris Braddock, MD   25 mcg at 10/02/15 0640  . magnesium hydroxide (MILK OF MAGNESIA) suspension 30 mL  30 mL Oral Daily PRN Amaiah Cristiano B Joshwa Hemric, MD      . nitroGLYCERIN (NITROSTAT) SL tablet 0.4 mg  0.4 mg Sublingual Q5 min PRN Lynette Topete B Reyce Lubeck, MD      . pantoprazole (PROTONIX) EC tablet 40 mg  40 mg Oral QAC breakfast Shari ProwsJolanta B Kateria Cutrona, MD   40 mg at 10/02/15 0640  . rOPINIRole (REQUIP) tablet 2 mg  2 mg Oral QHS Shari ProwsJolanta B Bailyn Spackman, MD   2 mg at 10/01/15 2159  . spironolactone (ALDACTONE) tablet 25 mg  25 mg Oral Daily Shari ProwsJolanta B Theone Bowell, MD   25 mg at 10/02/15 0841  . traZODone (DESYREL) tablet 100 mg  100 mg Oral QHS PRN Shari ProwsJolanta B Deepti Gunawan, MD   100 mg at 10/01/15 2159  . vitamin B-12 (CYANOCOBALAMIN) tablet 1,000 mcg  1,000 mcg Oral Daily Shari ProwsJolanta B Elius Etheredge, MD   1,000 mcg at 10/02/15 0840    Lab Results:  Results for orders placed or performed during the hospital encounter of 10/01/15 (from the past 48 hour(s))  Hemoglobin A1c     Status: None   Collection Time: 10/01/15 12:54 PM  Result Value Ref Range   Hgb A1c MFr Bld 5.4 4.0 - 6.0 %  TSH     Status: Abnormal   Collection Time: 10/01/15 12:54 PM  Result Value Ref Range   TSH 4.639 (H) 0.350 - 4.500 uIU/mL  Prolactin     Status: Abnormal   Collection Time: 10/01/15 12:54 PM  Result Value Ref Range   Prolactin 15.6 (H) 4.0 - 15.2 ng/mL    Comment: (NOTE) Performed At: [facility] [address] [place], [place] [id number] Mila HomerHancock  William F MD WG:[id number]:[phone]     Blood Alcohol level:  Lab Results  Component Value Date   [facility] <5 09/29/2015   ETH <5 07/23/2015    Metabolic Disorder Labs: Lab Results  Component Value Date   HGBA1C 5.4 10/01/2015   Lab Results  Component Value Date   PROLACTIN 15.6* 10/01/2015   Lab Results  Component Value Date   CHOL 98 09/29/2015   TRIG 153* 09/29/2015   HDL 40* 09/29/2015   CHOLHDL 2.5 09/29/2015   VLDL 31  09/29/2015   LDLCALC 27 09/29/2015    Physical Findings: AIMS: Facial and Oral Movements Muscles of Facial Expression: None, normal Lips and Perioral Area: None, normal Jaw: None, normal Tongue: None, normal,Extremity Movements Upper (arms, wrists, hands, fingers): None, normal Lower (legs, knees, ankles, toes): None, normal, Trunk Movements Neck, shoulders, hips: None, normal, Overall Severity Severity of abnormal movements (highest score from questions above): None, normal Incapacitation due to abnormal movements: None, normal Patient's awareness of abnormal movements (rate only patient's report): No Awareness, Dental Status Current problems with teeth and/or dentures?: No Does patient usually wear dentures?: No  CIWA:    COWS:     Musculoskeletal: Strength & Muscle Tone: within normal limits Gait & Station: normal Patient leans: N/A  Psychiatric Specialty Exam: Physical Exam  Nursing note and vitals reviewed.   Review of Systems  Psychiatric/Behavioral: Positive for depression and substance abuse.  All other systems reviewed and are negative.   Blood pressure 120/68, pulse 75, temperature 98.1 F (36.7 C), temperature source Oral, resp. rate 18, height  (1.854 m), weight 129.275 kg (285 lb).Body mass index is 37.61 kg/(m^2).  General Appearance: Casual  Eye Contact:  Good  Speech:  Clear and Coherent  Volume:  Normal  Mood:  Angry  Affect:  Congruent  Thought Process:  Goal Directed  Orientation:  Full (Time, Place, and Person)   Thought Content:  WDL  Suicidal Thoughts:  No  Homicidal Thoughts:  No  Memory:  Immediate;   Fair Recent;   Fair Remote;   Fair  Judgement:  Impaired  Insight:  Lacking  Psychomotor Activity:  Normal  Concentration:  Concentration: Fair and Attention Span: Fair  Recall:  [employer] of Knowledge:  Fair  Language:  Fair  Akathisia:  No  Handed:  Right  AIMS (if indicated):     Assets:  Communication Skills Desire for Improvement Financial Resources/Insurance Housing Resilience Social Support Transportation  ADL's:  Intact  Cognition:  WNL  Sleep:  Number of Hours: 5.25     Treatment Plan Summary: Daily contact with patient to assess and evaluate symptoms and progress in treatment and Medication management   Mr. Lumadue is a 70 year old male with history of depression and anxiety transferred from medical floor where he was briefly hospitalized after overdose on citalopram.   1. Suicidal ideation. This has resolved. The patient is able to contract for safety. He is forward thinking and optimistic about the future.  2. Mood and anxiety. We continued citalopram and added Abilify for augmentation.  3. Coronary artery disease. He is on Imdur, Nitrostat, Bumex, Aldacton, and Plavix.  4. Hypothyroidism. He is on Synthroid.  5. GERD. He is on Protonix.  6. Restless leg syndrome. He is on Requip.  7. B12 deficiency. We continue Vitamin B12 supplementation.  8. Insomnia. Trazodone is available.  9. Chronic pain. He is on Neurontin.   10. Sleep apnea. He has CPAP machine at night.   11. Metabolic syndrome screening. Lipid profile is elevated. No clubbing A1c, TSH and prolactin are pending.   12. Disposition. He will be discharged to home with his family. He will need new appointment with a psychiatrist.  Kristine Linea, MD 10/02/2015, 11:11 AM

## 2015-10-02 NOTE — [facility] Group Notes (Signed)
[facility] Group Notes:  (Nursing/MHT/Case Management/Adjunct)  Date:  10/02/2015  Time:  10:29 AM  Type of Therapy:  goal setting  Participation Level:  Did Not Attend  Twanna Hymanda C Meaghann Choo 10/02/2015, 10:29 AM

## 2015-10-02 NOTE — Discharge Summary (Deleted)
Physician Discharge Summary Note  Jesse [name]:  Jesse PerchesSamuel [name] is an 70 y.o., male MRN:  [id number] DOB:  05/03/1945 Jesse [name] phone:  [phone] (home)  Jesse [name] address:   [address] [place] [address],  Total Time spent with Jesse [name]: 30 minutes  Date of Admission:  10/01/2015 Date of Discharge: 10/02/2015  Reason for Admission:  Suicide attempt.  Jesse [name] is a 70 year old male with a history of anxiety and depression.  Chief complaint. "I don't know why I did it."  History of present illness. Information was obtained from the Jesse [name] and the chart. Jesse [name] is a history of depression and anxiety and has been treated with Celexa for about a year with improvement. In December 2016 Jesse [name] relocated from [place] to our area to be closer to the family. As a result, Jesse [name] had to give up woodworking as Jesse [name] no longer has a shop. Also 6 months ago Jesse [name] started experimental treatment for congestive heart failure [facility]. Jesse [name] was prescribed to experimental medications and names of which Jesse [name] does not remember and they are not on the list of Jesse [name] medications that Jesse [name] believes made Jesse [name] depression and anxiety worse. Jesse [name] denies frank symptoms of depression but reports periods of "melancjoly", especially in the evening when there is nothing for Jesse [name] to do. During such melancholy feeling Jesse [name] impulsively overdosed on citalopram. Jesse [name] wife noticed an empty pill box and brought to the hospital. Jesse [name] was briefly hospitalized on the medical floor and transferred to psychiatry for further treatment. The Jesse [name] is glad to be alive and points out how lucky Jesse [name] is in life. Jesse [name] is financially stable, has a good wife and children, has new granddaughter. Jesse [name] volunteers at the hospice. Jesse [name] stays busy all day long. In spite of her problems Jesse [name] considers himself relatively healthy 70 year old. Jesse [name] is absolutely no explanation for Jesse [name] action. Jesse [name] regrets it and hopes that Jesse [name] will never do it again. Jesse [name] denies severe anxiety and thinks that  citalopram has been very helpful. Jesse [name] denies psychotic symptoms or symptoms suggestive of bipolar mania. There is no alcohol or drug problems.  Past psychiatric history. Nothing before current depressive episode.  Family psychiatric history. None reported.  Social history. Jesse [name] is originally from our area. For the past 14 years Jesse [name] lives in [place] and relocated back in December 2016. Jesse [name] lives with Jesse [name] wife. Jesse [name] is retired. Jesse [name] has insurance.  Principal Problem: Major depressive disorder, single episode, severe without psychosis [facility](HCC) Discharge Diagnoses: Jesse [name] Active Problem List   Diagnosis Date Noted  . Hypothyroidism [E03.9] 10/01/2015  . B12 deficiency [E53.8] 10/01/2015  . Major depressive disorder, single episode, severe without psychosis (HCC) [F32.2] 10/01/2015  . Overdose [T50.901A] 09/29/2015  . CAD (coronary artery disease) [I25.10] 09/29/2015  . Severe major depression, single episode, without psychotic features (HCC) [F32.2] 09/29/2015  . Suicidal ideation [R45.851] 09/29/2015  . Suicide attempt (HCC) [T14.91] 09/29/2015  . Normochromic normocytic anemia [D64.9]   . Thrombocytopenia (HCC) [D69.6]   . Generalized weakness [R53.1] 07/26/2015  . S/P cardiac catheterization [Z98.890] 07/26/2015  . Obstructive sleep apnea [G47.33] 07/26/2015  . Restless leg syndrome [G25.81] 07/26/2015  . Essential hypertension [I10] 07/26/2015  . Lymphedema [I89.0] 07/26/2015  . Obesity [E66.9] 07/26/2015   Past Medical History:  Past Medical History  Diagnosis Date  . Hypertension   . Diabetes mellitus without complication (HCC)   . CHF (congestive heart failure) (HCC)   . CAD (coronary artery disease)   . A-fib (HCC)   . Sleep apnea   .  Chronic edema     Past Surgical History  Procedure Laterality Date  . Pacemaker insertion    . Cardiac catheterization Right 07/25/2015    Procedure: Left Heart Cath and Coronary Angiography;  Surgeon: Laurier Nancy, MD;  Location: [facility] INVASIVE  CV LAB;  Service: Cardiovascular;  Laterality: Right;   Family History:  Family History  Problem Relation Age of Onset  . Diabetes Brother    Social History:  History  Alcohol Use No     History  Drug Use No    Social History   Social History  . Marital Status: Married    Spouse Name: N/A  . Number of Children: N/A  . Years of Education: N/A   Occupational History  . retired    Social History Main Topics  . Smoking status: Former Games developer  . Smokeless tobacco: None  . Alcohol Use: No  . Drug Use: No  . Sexual Activity: Not Asked   Other Topics Concern  . None   Social History Narrative    Hospital Course:    Jesse [name] is a 70 year old male with history of depression and anxiety transferred from medical floor where Jesse [name] was briefly hospitalized after overdose on citalopram.   1. Suicidal ideation. This has resolved. The Jesse [name] is able to contract for safety. Jesse [name] is forward thinking and optimistic about the future.  2. Mood and anxiety. We continued citalopram and added Abilify for augmentation.  3. Coronary artery disease. Jesse [name] is on Imdur, Nitrostat, Bumex, Aldacton, and Plavix.  4. Hypothyroidism. Jesse [name] is on Synthroid.  5. GERD. Jesse [name] is on Protonix.  6. Restless leg syndrome. Jesse [name] is on Requip.  7. B12 deficiency. We continue Vitamin B12 supplementation.  8. Insomnia. Trazodone is available.  9. Chronic pain. Jesse [name] is on Neurontin.   10. Sleep apnea. Jesse [name] has CPAP machine at night.   11. Metabolic syndrome screening. Lipid profile is elevated. No clubbing A1c, TSH and prolactin are pending.   12. Disposition. Jesse [name] was discharged to home with Jesse [name] family. Jesse [name] will need new appointment with a psychiatrist.  Physical Findings: AIMS: Facial and Oral Movements Muscles of Facial Expression: None, normal Lips and Perioral Area: None, normal Jaw: None, normal Tongue: None, normal,Extremity Movements Upper (arms, wrists, hands, fingers): None, normal Lower (legs, knees,  ankles, toes): None, normal, Trunk Movements Neck, shoulders, hips: None, normal, Overall Severity Severity of abnormal movements (highest score from questions above): None, normal Incapacitation due to abnormal movements: None, normal Jesse [name]'s awareness of abnormal movements (rate only Jesse [name]'s report): No Awareness, Dental Status Current problems with teeth and/or dentures?: No Does Jesse [name] usually wear dentures?: No  CIWA:    COWS:     Musculoskeletal: Strength & Muscle Tone: within normal limits Gait & Station: normal Jesse [name] leans: N/A  Psychiatric Specialty Exam: Physical Exam  Nursing note and vitals reviewed.   Review of Systems  Psychiatric/Behavioral: Positive for depression.  All other systems reviewed and are negative.   Blood pressure 120/68, pulse 75, temperature 98.1 F (36.7 C), temperature source Oral, resp. rate 18, height  (1.854 m), weight 129.275 kg (285 lb).Body mass index is 37.61 kg/(m^2).  See SRA.                                                  Sleep:  Number of Hours: 5.25  Have you used any form of tobacco in the last 30 days? (Cigarettes, Smokeless Tobacco, Cigars, and/or Pipes): No  Has this Jesse [name] used any form of tobacco in the last 30 days? (Cigarettes, Smokeless Tobacco, Cigars, and/or Pipes) Yes, No  Blood Alcohol level:  Lab Results  Component Value Date   [facility] <5 09/29/2015   ETH <5 07/23/2015    Metabolic Disorder Labs:  Lab Results  Component Value Date   HGBA1C 5.4 10/01/2015   Lab Results  Component Value Date   PROLACTIN 15.6* 10/01/2015   Lab Results  Component Value Date   CHOL 98 09/29/2015   TRIG 153* 09/29/2015   HDL 40* 09/29/2015   CHOLHDL 2.5 09/29/2015   VLDL 31 09/29/2015   LDLCALC 27 09/29/2015    See Psychiatric Specialty Exam and Suicide Risk Assessment completed by Attending Physician prior to discharge.  Discharge destination:  Home  Is Jesse [name] on multiple  antipsychotic therapies at discharge:  No   Has Jesse [name] had three or more failed trials of antipsychotic monotherapy by history:  No  Recommended Plan for Multiple Antipsychotic Therapies: NA  Discharge Instructions    Diet - low sodium heart healthy    Complete by:  As directed      Increase activity slowly    Complete by:  As directed             Medication List    TAKE these medications      Indication   ARIPiprazole 2 MG tablet  Commonly known as:  ABILIFY  Take 1 tablet (2 mg total) by mouth daily.   Indication:  Major Depressive Disorder     bumetanide 1 MG tablet  Commonly known as:  BUMEX  Take 1 tablet (1 mg total) by mouth daily. Or as directed by cardiologist      clopidogrel 75 MG tablet  Commonly known as:  PLAVIX  Take 1 tablet (75 mg total) by mouth daily.      ferrous sulfate 325 (65 FE) MG tablet  Take 650 mg by mouth 2 (two) times daily.      gabapentin 400 MG capsule  Commonly known as:  NEURONTIN  Take 1 capsule by mouth 3 (three) times daily as needed (for pain).      isosorbide mononitrate 60 MG 24 hr tablet  Commonly known as:  IMDUR  Take 60 mg by mouth daily.      levothyroxine 25 MCG tablet  Commonly known as:  SYNTHROID, LEVOTHROID  Take 25 mcg by mouth daily.      LORazepam 1 MG tablet  Commonly known as:  ATIVAN  Take 1 mg by mouth at bedtime.      nitroGLYCERIN 0.4 MG SL tablet  Commonly known as:  NITROSTAT  Place 0.4 mg under the tongue every 5 (five) minutes as needed for chest pain.      pantoprazole 40 MG tablet  Commonly known as:  PROTONIX  Take 40 mg by mouth daily.      potassium chloride SA 20 MEQ tablet  Commonly known as:  K-DUR,KLOR-CON  Take 1 tablet (20 mEq total) by mouth daily.      rOPINIRole 2 MG tablet  Commonly known as:  REQUIP  Take 4 mg by mouth at bedtime.      spironolactone 25 MG tablet  Commonly known as:  ALDACTONE  Take 25 mg by mouth daily.      traZODone 100 MG tablet  Commonly known  as:  DESYREL  Take 1 tablet (100 mg total) by mouth at bedtime as needed for sleep.   Indication:  Trouble Sleeping     vitamin B-12 1000 MCG tablet  Commonly known as:  CYANOCOBALAMIN  Take 1,000 mcg by mouth daily.      vitamin C 1000 MG tablet  Take 1,000 mg by mouth daily.      Vitamin D3 2000 units Tabs  Take 2,000 Units by mouth daily.          Follow-up recommendations:  Activity:  As tolerated. Diet:  Low sodium heart healthy. Other:  Keep follow-up appointments.  Comments:    Signed: Kristine LineaJolanta Pucilowska, MD 10/02/2015, 9:41 AM

## 2015-10-02 NOTE — [facility] Group Notes (Signed)
[facility] LCSW Group Therapy  10/02/2015 2:54 PM  Type of Therapy:  Group Therapy  Participation Level:  Active  Participation Quality:  Attentive  Affect:  Appropriate  Cognitive:  Alert  Insight:  Improving  Engagement in Therapy:  Improving  Modes of Intervention:  Discussion, Education, Socialization and Support  Summary of Progress/Problems: Todays topic: Grudges  Patients will be encouraged to discuss their thoughts, feelings, and behaviors as to why one holds on to grudges and reasons why people have grudges. Patients will process the impact of grudges on their daily lives and identify thoughts and feelings related to holding grudges. Patients will identify feelings and thoughts related to what life would look like without grudges. Pt attended group and stayed the entire time. He discussed hating himself for gambling. He states he spends money he knows he does not have. He states he gambles all the time. He is thankful that his wife is supportive of him.   Sempra EnergyCandace L Massie Cogliano MSW, LCSWA  10/02/2015, 2:54 PM

## 2015-10-03 ENCOUNTER — Encounter: Admission: RE | Payer: Self-pay | Source: Ambulatory Visit

## 2015-10-03 ENCOUNTER — Ambulatory Visit: Admission: RE | Admit: 2015-10-03 | Payer: Medicare Other | Source: Ambulatory Visit | Admitting: Specialist

## 2015-10-03 ENCOUNTER — Encounter: Payer: Self-pay | Admitting: Psychiatry

## 2015-10-03 DIAGNOSIS — I509 Heart failure, unspecified: Secondary | ICD-10-CM

## 2015-10-03 SURGERY — EXCISION, GANGLION CYST, WRIST
Anesthesia: Choice | Laterality: Right

## 2015-10-03 MED ORDER — BUMETANIDE 1 MG PO TABS: 2.0000 mg | ORAL_TABLET | Freq: Every day | ORAL | Status: DC

## 2015-10-03 MED ORDER — VITAMIN C 500 MG PO TABS
1000.0000 mg | ORAL_TABLET | Freq: Every day | ORAL | Status: DC
  Administered 2015-10-03 – 2015-10-05 (×3): 1000 mg via ORAL
  Filled 2015-10-03 (×5): qty 2

## 2015-10-03 MED ORDER — VITAMIN D 1000 UNITS PO TABS
2000.0000 [IU] | ORAL_TABLET | Freq: Every day | ORAL | Status: DC
  Administered 2015-10-03 – 2015-10-05 (×3): 2000 [IU] via ORAL
  Filled 2015-10-03 (×3): qty 2

## 2015-10-03 MED ORDER — NALTREXONE HCL 50 MG PO TABS
50.0000 mg | ORAL_TABLET | Freq: Every day | ORAL | Status: DC
  Administered 2015-10-03 – 2015-10-05 (×3): 50 mg via ORAL
  Filled 2015-10-03 (×3): qty 1

## 2015-10-03 MED ORDER — BUMETANIDE 1 MG PO TABS
1.0000 mg | ORAL_TABLET | Freq: Every day | ORAL | Status: DC
  Filled 2015-10-03: qty 1

## 2015-10-03 MED ORDER — SERTRALINE HCL 25 MG PO TABS
25.0000 mg | ORAL_TABLET | Freq: Every day | ORAL | Status: DC
  Administered 2015-10-03 – 2015-10-05 (×3): 25 mg via ORAL
  Filled 2015-10-03 (×3): qty 1

## 2015-10-03 MED ORDER — ROPINIROLE HCL 1 MG PO TABS
4.0000 mg | ORAL_TABLET | Freq: Every day | ORAL | Status: DC
Start: 2015-10-03 — End: 2015-10-05
  Administered 2015-10-03 – 2015-10-04 (×2): 4 mg via ORAL
  Filled 2015-10-03 (×2): qty 4

## 2015-10-03 MED ORDER — FERROUS SULFATE 325 (65 FE) MG PO TABS
650.0000 mg | ORAL_TABLET | Freq: Two times a day (BID) | ORAL | Status: DC
  Administered 2015-10-03 – 2015-10-05 (×4): 650 mg via ORAL
  Filled 2015-10-03 (×5): qty 2

## 2015-10-03 MED ORDER — POTASSIUM CHLORIDE CRYS ER 10 MEQ PO TBCR
20.0000 meq | EXTENDED_RELEASE_TABLET | Freq: Every day | ORAL | Status: DC
  Administered 2015-10-03 – 2015-10-05 (×3): 20 meq via ORAL
  Filled 2015-10-03 (×3): qty 2

## 2015-10-03 NOTE — Progress Notes (Signed)
Recreation Therapy Notes  Date: 06.26.17 Time: 1:00 pm Location: Craft Room  Group Topic: Wellness  Goal Area(s) Addresses:  Patient will identify at least one item per dimension of health Patient will examine areas they are deficient in.  Behavioral Response: Attentive, Interactive  Intervention: 6 Dimensions of Health  Activity: Patients were given a definition sheet of the 6 Dimensions of Health and instructed to read the sheet. Patients were given a worksheet with the 6 dimensions of health on it and encouraged to write 2-3 things they were currently doing in each dimension.  Education: LRT educated patients on how to improve each dimension of health.  Education Outcome: Acknowledges education/In group clarification offered  Clinical Observations/Feedback: Patient completed activity by writing at least 2 items in each dimension of health. Patient contributed to group discussion by stating what area he was giving enough attention to, what area he was not giving enough attention to, and things he can do to improve certain dimensions.  Jacquelynn CreeGreene,Neila Teem M, LRT/CTRS 10/03/2015 3:15 PM

## 2015-10-03 NOTE — [facility] Group Notes (Signed)
[facility] Group Notes:  (Nursing/MHT/Case Management/Adjunct)  Date:  10/03/2015  Time:  4:20 PM  Type of Therapy:  Psychoeducational Skills  Participation Level:  Active  Participation Quality:  Appropriate  Affect:  Appropriate  Cognitive:  Appropriate  Insight:  Appropriate  Engagement in Group:  Engaged  Modes of Intervention:  Discussion and Education  Summary of Progress/Problems:  Jesse [name] 10/03/2015, 4:20 PM

## 2015-10-03 NOTE — Progress Notes (Signed)
Pt pleasant and cooperative with care. Denies SI, HI, AVH. No negative behaviors noted. Med and group compliant. Appropriate with staff and peers.  Encouragement and support offered. Pt receptive and remains safe on unit with q 15 min safety checks.

## 2015-10-03 NOTE — Progress Notes (Signed)
  Jesse [name] :  Will you be returning to the same living situation after discharge:  Yes,  home with wife. - Jesse [name] (wife) At discharge, do you have transportation home?: Yes,  wife will pick patient up  Do you have the ability to pay for your medications: Yes,  [employer] insurance   Release of information consent forms completed and in the chart;  Patient's signature needed at discharge.  Patient to Follow up at: Follow-up Information    Follow up with Jesse [name]  On 10/06/2015.   Why:  Your hospital follow up is with Dr. Garnetta [name] on June 29th.    Contact information:   [facility]  [address], [address] [place], [place] [address] Phone: [phone] Fax: [phone]      Next level of care provider has access to [facility] Link:yes  Safety Planning and Suicide Prevention discussed: Yes,  wife is aware of SPE and plans  Have you used any form of tobacco in the last 30 days? (Cigarettes, Smokeless Tobacco, Cigars, and/or Pipes): No  Has patient been referred to the Quitline?: N/A patient is not a smoker  Patient has been referred for addiction treatment: N/A  Jesse OxfordKadijah R Carneshia Raker, LCSW-A 10/03/2015, 2:04 PM

## 2015-10-03 NOTE — Plan of Care (Signed)
Problem: Activity: Goal: Sleeping patterns will improve Outcome: Progressing Patient reports improved sleeping

## 2015-10-03 NOTE — [facility] Group Notes (Signed)
[facility] LCSW Group Therapy   10/03/2015 9:30am Type of Therapy: Group Therapy   Participation Level: Active   Participation Quality: Attentive, Sharing and Supportive   Affect: Appropriate  Cognitive: Alert and Oriented   Insight: Developing/Improving and Engaged   Engagement in Therapy: Developing/Improving and Engaged   Modes of Intervention: Clarification, Confrontation, Discussion, Education, Exploration,  Limit-setting, Orientation, Problem-solving, Rapport Building, Dance movement psychotherapisteality Testing, Socialization and Support   Summary of Progress/Problems: Pt identified obstacles faced currently and processed barriers involved in overcoming these obstacles. Pt identified steps necessary for overcoming these obstacles and explored motivation (internal and external) for facing these difficulties head on. Pt further identified one area of concern in their lives and chose a goal to focus on for today. Patient identifies obstacles with gambling and believes that use of the right medications will help improve in those areas. He stated that he believes his heart medications is creating obstacles such as depressive symptoms. Pt believes that talking with his wife about certain problems would be helpful in overcoming his obstacles.  Lynden OxfordKadijah R. Consuelo Suthers, LCSWA  10/03/2015

## 2015-10-03 NOTE — Progress Notes (Addendum)
[facility] MD Progress Note  10/04/2015 8:43 AM Jesse [name]  MRN:  [id number] Subjective:  Patient tells me today he was admitted after he overdosed on Celexa. He tells me this was a very impulsive act. He tells me he has been suffering for depression for many years and this medication was prescribed by his primary care provider. 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  I spoke with the patient's wife as well. She tells me the patient suffers from alcoholism and gambling addiction. She tells me the patient's family suffers from heroin addiction and alcoholism and gambling issues. The patient's father was also a gambler.   Per the wife the patient attempted suicide back in Easter when he makes 15 Ativan with alcohol. He was found lying on the floor after he had fallen. The patient was on the floor for about 2 nights as stay wife was out of town. The patient was brought in here as he was in rhabdomyolysis and delirium. Patient had a large laceration on his wrist that required sutures. The patient says that at that time the patient denied this was a suicidal attempt but now looking back she can see clearly this was his first attempt.  Patient has multiple charges on credit cards for thousands of dollars. The wife believes he has to pay $15,000 on credit cards. They also recently went fine by Social Security for being over paid. They have to pay them $15,000. In the IRS is asking for $15,000 as well.  Most of the money that they have to pay in the credit cards is related to the gambling problem. She says that he has to spend thousands of dollars on slot machines.  Patient also suffers from alcoholism. She says that he used to drink on a daily basis. However after his hospitalization in ICU backing history he stopped drinking. However the wife said that he pick Alcohol again 2 weeks ago.   Patient  says he has remorse and guilt because of the debt. He has an Brewing technologist that he has looked into and they will pay for any cause of death including suicide.   Principal Problem: Major depressive disorder, single episode, severe without psychosis (HCC) Diagnosis:   Patient Active Problem List   Diagnosis Date Noted  . Alcohol use disorder, severe, dependence (HCC) [F10.20] 10/04/2015  . 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 CHF (congestive heart failure) (HCC) [I50.9] 10/03/2015  . Hypothyroidism [E03.9] 10/01/2015  . B12 deficiency [E53.8] 10/01/2015  . Major depressive disorder, single episode, severe without psychosis (HCC) [F32.2] 10/01/2015  . Overdose [T50.901A] 09/29/2015  . CAD (coronary artery disease) [I25.10] 09/29/2015  . Suicide attempt (HCC) [T14.91] 09/29/2015  . Normochromic normocytic anemia [D64.9]   . Thrombocytopenia (HCC) [D69.6]   . Obstructive sleep apnea [G47.33] 07/26/2015  . Restless leg syndrome [G25.81] 07/26/2015  . Essential hypertension [I10] 07/26/2015  . Lymphedema [I89.0] 07/26/2015  . Obesity [E66.9] 07/26/2015   Total Time spent with patient: 30 minutes  Past Psychiatric History: One prior suicidal attempt back in Easter. He was prescribed with citalopram by his primary care provider  Past Medical History:  Past Medical History  Diagnosis Date  . Hypertension   . Diabetes mellitus without complication (HCC)   . CHF (congestive heart failure) (HCC)   . CAD (coronary artery disease)   . A-fib (HCC)   . Sleep  apnea   . Chronic edema     Past Surgical History  Procedure Laterality Date  . Pacemaker insertion    . Cardiac catheterization Right 07/25/2015    Procedure: Left Heart Cath and Coronary Angiography;  Surgeon: Laurier NancyShaukat A Khan, MD;  Location: [facility] INVASIVE CV LAB;  Service: Cardiovascular;  Laterality: Right;   Family History:  Family History  Problem Relation Age of Onset  . Diabetes Brother    Family Psychiatric   History: Father suffered from a gambling addiction siblings suffer from heroine and alcohol addiction  Social History:  History  Alcohol Use No     History  Drug Use No    Social History   Social History  . Marital Status: Married    Spouse Name: N/A  . Number of Children: N/A  . Years of Education: N/A   Occupational History  . retired    Social History Main Topics  . Smoking status: Former Games developermoker  . Smokeless tobacco: None  . Alcohol Use: No  . Drug Use: No  . Sexual Activity: Not Asked   Other Topics Concern  . None   Social History Narrative   Additional Social History:    Pain Medications: see PTA meds Prescriptions: see PTA meds Over the Counter: see PTA meds History of alcohol / drug use?: No history of alcohol / drug abuse    Current Medications: Current Facility-Administered Medications  Medication Dose Route Frequency Provider Last Rate Last Dose  . acetaminophen (TYLENOL) tablet 650 mg  650 mg Oral Q6H PRN Jolanta B Pucilowska, MD      . alum & mag hydroxide-simeth (MAALOX/MYLANTA) 200-200-20 MG/5ML suspension 30 mL  30 mL Oral Q4H PRN Jolanta B Pucilowska, MD      . bumetanide (BUMEX) tablet 1 mg  1 mg Oral Daily Jimmy FootmanAndrea Hernandez-Gonzalez, MD      . bumetanide (BUMEX) tablet 2 mg  2 mg Oral QHS Jimmy FootmanAndrea Hernandez-Gonzalez, MD      . cholecalciferol (VITAMIN D) tablet 2,000 Units  2,000 Units Oral Daily Jimmy FootmanAndrea Hernandez-Gonzalez, MD   2,000 Units at 10/03/15 1218  . clopidogrel (PLAVIX) tablet 75 mg  75 mg Oral Daily Shari ProwsJolanta B Pucilowska, MD   75 mg at 10/03/15 0851  . ferrous sulfate tablet 650 mg  650 mg Oral BID WC Jimmy FootmanAndrea Hernandez-Gonzalez, MD   650 mg at 10/03/15 1705  . gabapentin (NEURONTIN) capsule 400 mg  400 mg Oral TID Shari ProwsJolanta B Pucilowska, MD   400 mg at 10/03/15 2209  . isosorbide mononitrate (IMDUR) 24 hr tablet 60 mg  60 mg Oral Daily Jolanta B Pucilowska, MD   60 mg at 10/03/15 0851  . levothyroxine (SYNTHROID, LEVOTHROID) tablet 25 mcg  25  mcg Oral QAC breakfast Shari ProwsJolanta B Pucilowska, MD   25 mcg at 10/04/15 0644  . magnesium hydroxide (MILK OF MAGNESIA) suspension 30 mL  30 mL Oral Daily PRN Jolanta B Pucilowska, MD      . naltrexone (DEPADE) tablet 50 mg  50 mg Oral Daily Jimmy FootmanAndrea Hernandez-Gonzalez, MD   50 mg at 10/03/15 1705  . nitroGLYCERIN (NITROSTAT) SL tablet 0.4 mg  0.4 mg Sublingual Q5 min PRN Jolanta B Pucilowska, MD      . pantoprazole (PROTONIX) EC tablet 40 mg  40 mg Oral QAC breakfast Jolanta B Pucilowska, MD   40 mg at 10/04/15 0645  . potassium chloride (K-DUR,KLOR-CON) CR tablet 20 mEq  20 mEq Oral Daily Jimmy FootmanAndrea Hernandez-Gonzalez, MD   20 mEq at 10/03/15 1218  .  rOPINIRole (REQUIP) tablet 4 mg  4 mg Oral QHS Jimmy Footman, MD   4 mg at 10/03/15 2209  . sertraline (ZOLOFT) tablet 25 mg  25 mg Oral Daily Jimmy Footman, MD   25 mg at 10/03/15 1217  . spironolactone (ALDACTONE) tablet 25 mg  25 mg Oral Daily Shari Prows, MD   25 mg at 10/03/15 0851  . traZODone (DESYREL) tablet 100 mg  100 mg Oral QHS PRN Shari Prows, MD   100 mg at 10/03/15 2210  . vitamin B-12 (CYANOCOBALAMIN) tablet 1,000 mcg  1,000 mcg Oral Daily Shari Prows, MD   1,000 mcg at 10/03/15 0851  . vitamin C (ASCORBIC ACID) tablet 1,000 mg  1,000 mg Oral Daily Jimmy Footman, MD   1,000 mg at 10/03/15 1705    Lab Results:  No results found for this or any previous visit (from the past 48 hour(s)).  Blood Alcohol level:  Lab Results  Component Value Date   [facility] <5 09/29/2015   ETH <5 07/23/2015    Metabolic Disorder Labs: Lab Results  Component Value Date   HGBA1C 5.4 10/01/2015   Lab Results  Component Value Date   PROLACTIN 15.6* 10/01/2015   Lab Results  Component Value Date   CHOL 98 09/29/2015   TRIG 153* 09/29/2015   HDL 40* 09/29/2015   CHOLHDL 2.5 09/29/2015   VLDL 31 09/29/2015   LDLCALC 27 09/29/2015    Physical Findings: AIMS: Facial and Oral Movements Muscles  of Facial Expression: None, normal Lips and Perioral Area: None, normal Jaw: None, normal Tongue: None, normal,Extremity Movements Upper (arms, wrists, hands, fingers): None, normal Lower (legs, knees, ankles, toes): None, normal, Trunk Movements Neck, shoulders, hips: None, normal, Overall Severity Severity of abnormal movements (highest score from questions above): None, normal Incapacitation due to abnormal movements: None, normal Patient's awareness of abnormal movements (rate only patient's report): No Awareness, Dental Status Current problems with teeth and/or dentures?: No Does patient usually wear dentures?: No  CIWA:    COWS:     Musculoskeletal: Strength & Muscle Tone: within normal limits Gait & Station: normal Patient leans: N/A  Psychiatric Specialty Exam: Physical Exam  Constitutional: He is oriented to person, place, and time. He appears well-developed and well-nourished.  HENT:  Head: Normocephalic and atraumatic.  Eyes: Conjunctivae and EOM are normal.  Neck: Normal range of motion.  Respiratory: Effort normal.  Musculoskeletal: Normal range of motion.  Neurological: He is alert and oriented to person, place, and time.    Review of Systems  Constitutional: Negative.   HENT: Negative.   Eyes: Negative.   Respiratory: Negative.   Cardiovascular: Negative.   Gastrointestinal: Negative.   Genitourinary: Negative.   Musculoskeletal: Negative.   Skin: Negative.   Neurological: Negative.   Endo/Heme/Allergies: Negative.   Psychiatric/Behavioral: Positive for depression and substance abuse.    Blood pressure 126/73, pulse 72, temperature 97.9 F (36.6 C), temperature source Oral, resp. rate 18, height  (1.854 m), weight 129.275 kg (285 lb).Body mass index is 37.61 kg/(m^2).  General Appearance: Well Groomed  Eye Contact:  Good  Speech:  Clear and Coherent  Volume:  Normal  Mood:  Dysphoric  Affect:  Blunt  Thought Process:  Linear and Descriptions of  Associations: Intact  Orientation:  Full (Time, Place, and Person)  Thought Content:  Hallucinations: None  Suicidal Thoughts:  No  Homicidal Thoughts:  No  Memory:  Immediate;   Good Recent;   Good Remote;  Good  Judgement:  Poor  Insight:  Shallow  Psychomotor Activity:  Normal  Concentration:  Concentration: Good and Attention Span: Good  Recall:  Good  Fund of Knowledge:  Good  Language:  Good  Akathisia:  No  Handed:    AIMS (if indicated):     Assets:  Communication Skills Housing Social Support  ADL's:  Intact  Cognition:  WNL  Sleep:  Number of Hours: 7.5     Treatment Plan Summary:  Major depressive disorder the patient will be started on sertraline 25 mg by mouth daily. This medication has been chosen as he has minimal interaction with other medications. And there has been a status that have proven its safety in patients with heart disease  Alcohol dependence and gambling addiction: Patient will be started on naltrexone 50 mg by mouth daily.  There is a stronger evidence that this medication is more effective when significant genetic predisposition is found. In this case it is our multiple relatives with addiction.  Congestive heart failure patient will be continued on Bumex 1 mg by mouth daily and 2 mg at bedtime.  Continue spironolactone 25 mg a day. Patient is also on 2 medications (1 is placebo) which are part of [facility]. Wife will bring these 2 medications tonight.  Cardiovascular distress discontinue Plavix 75 mg a day.  Continue nitroglycerin when necessary  Atrial fibrillation continue imdur 60 mg daily  Hypokalemic continue K-Dur 20 milliequivalents a day  Hypothyroidism continue Synthroid 25 g a day  Restless leg syndrome continue Requip 4 mg a day  Chronic pain continue Neurontin 400 mg 3 times a day  Vitamin D deficiency continue vitamin D 2000 units daily  Vitamin B12 deficiency continue to remain B12 1000 g daily  Iron  deficiency anemia continue vitamin C and ferrous sulfate  GERD continue Protonix 40 mg a day  Labs I will order TSH, T3, T4 and vitamin B12 level  Collateral information obtained from wife. Our conversation lasted about 30 minutes.  I also contacted you when seen cardiology and having spoken with the patient's nurse practitioner.  Jimmy FootmanHernandez-Gonzalez,  Toshiyuki Fredell, MD 10/04/2015, 8:43 AM

## 2015-10-03 NOTE — Progress Notes (Signed)
Recreation Therapy Notes  INPATIENT RECREATION THERAPY ASSESSMENT  Patient Details Name: Jesse [name] MRN: [id number] DOB: 10/20/1945 Today's Date: 10/03/2015  Patient Stressors:  Patient reported no stressors.  Coping Skills:   Art/Dance, Talking, Music, Sports  Personal Challenges: Self-Esteem/Confidence, Trusting Others  Leisure Interests (2+):  Nature - Fishing, Individual - Other (Comment) (Wood work)  Biochemist, clinicalAwareness of Community Resources:  Yes  Community Resources:  [place], [place]  Current Use: Yes  If no, Barriers?:    Patient Strengths:  Good provider, good father  Patient Identified Areas of Improvement:  Being a better husband  Current Recreation Participation:  [name], Therapist, musicfishing, working at hospice  Patient Goal for Hospitalization:  To get over the anxiety about who he is and what he has been doing and be a better person for his family  [name]  [name]   Current SI (including self-harm):  No  Current HI:  No  Consent to Intern Participation: N/A   Jacquelynn CreeGreene,Katryn Plummer M, LRT/CTRS 10/03/2015, 3:19 PM

## 2015-10-04 ENCOUNTER — Encounter: Payer: Self-pay | Admitting: Psychiatry

## 2015-10-04 DIAGNOSIS — F1020 Alcohol dependence, uncomplicated: Secondary | ICD-10-CM

## 2015-10-04 DIAGNOSIS — F630 Pathological gambling: Secondary | ICD-10-CM

## 2015-10-04 LAB — GLUCOSE, CAPILLARY: GLUCOSE-CAPILLARY: 104 mg/dL — AB (ref 65–99)

## 2015-10-04 LAB — VITAMIN B12: Vitamin B-12: 914 pg/mL (ref 180–914)

## 2015-10-04 MED ORDER — NALTREXONE HCL 50 MG PO TABS: 50.0000 mg | ORAL_TABLET | Freq: Every day | ORAL | Status: DC

## 2015-10-04 MED ORDER — BUMETANIDE 1 MG PO TABS: 1.0000 mg | ORAL_TABLET | Freq: Every day | ORAL | Status: AC

## 2015-10-04 MED ORDER — SERTRALINE HCL 25 MG PO TABS: 25.0000 mg | ORAL_TABLET | Freq: Every day | ORAL | Status: DC

## 2015-10-04 MED ORDER — BUMETANIDE 1 MG PO TABS
1.0000 mg | ORAL_TABLET | Freq: Every day | ORAL | Status: DC
  Administered 2015-10-04: 1 mg via ORAL
  Filled 2015-10-04: qty 1

## 2015-10-04 MED ORDER — BUMETANIDE 2 MG PO TABS: 2.0000 mg | ORAL_TABLET | Freq: Every day | ORAL | Status: DC

## 2015-10-04 MED ORDER — BUMETANIDE 1 MG PO TABS
2.0000 mg | ORAL_TABLET | Freq: Every day | ORAL | Status: DC
  Administered 2015-10-04 – 2015-10-05 (×2): 2 mg via ORAL
  Filled 2015-10-04 (×2): qty 2

## 2015-10-04 MED ORDER — BUMETANIDE 1 MG PO TABS: 1.0000 mg | ORAL_TABLET | Freq: Every day | ORAL | Status: DC

## 2015-10-04 NOTE — Discharge Summary (Signed)
[facility]   PATIENT NAME: Jesse PerchesSamuel [name]    MR#:  [id number]  DATE OF BIRTH:  12/24/1945  DATE OF ADMISSION:  09/29/2015 ADMITTING PHYSICIAN: Gery Prayebby Crosley, MD  DATE OF DISCHARGE: 10/01/2015  3:35 AM  PRIMARY CARE PHYSICIAN: [name], MD   ADMISSION DIAGNOSIS:  Medication overdose, intentional self-harm, initial encounter (HCC) [T50.902A]  DISCHARGE DIAGNOSIS:  Active Problems:   Obstructive sleep apnea   Restless leg syndrome   Essential hypertension   Lymphedema   Overdose   CAD (coronary artery disease)   Suicide attempt (HCC)   SECONDARY DIAGNOSIS:   Past Medical History  Diagnosis Date  . Hypertension   . Diabetes mellitus without complication (HCC)   . CHF (congestive heart failure) (HCC)   . CAD (coronary artery disease)   . A-fib (HCC)   . Sleep apnea   . Chronic edema      ADMITTING HISTORY  This a 70 year old male who is very depressed. Tonight he took a total of 90 twenty mg Celexa tablets. He took them at approximately 7:30 AM. His wife from under him unresponsive and called 911, he was brought to ER. In the ER where he has received activated charcoal. he is involuntarily committed. Psychiatry has accepted the patient. He has EKG changes but no chest pains. The patient does not meet Scarbrough criteria. Cardiology Dr Welton Flakeskhan will see patient in stepdown. The patient has had a recent left heart cath 07/2015 that showed high-grade stenosis of proximal RCA which is currently being managed medically, Plavix was added to medical regiment.  HOSPITAL COURSE:   . citalopram Overdose -Monitored in telemetry, one-to-one sitter -Involuntarily committed - Complications - Medically cleared to be discharged to behavioral health unit  . CAD (coronary artery disease) Troponin has been normal. Patient has been seen by cardiology Dr. Welton FlakesKhan. Continue home medications. No further workup needed.  . Essential  hypertension -Stable, home medications resumed  . Obstructive sleep apnea -Stable, CPAP at night  . Restless leg syndrome -Stable, monitor  Patient stable for discharge to behavioral health unit and transferred.  CONSULTS OBTAINED:  Treatment Team:  Audery AmelJohn T Clapacs, MD  DRUG ALLERGIES:   Allergies  Allergen Reactions  . Sulfa Antibiotics Other (See Comments)    hallucinations   . Latex Rash    DISCHARGE MEDICATIONS:   Discharge Medication List as of 10/01/2015  3:35 AM    CONTINUE these medications which have NOT CHANGED   Details  Ascorbic Acid (VITAMIN C) 1000 MG tablet Take 1,000 mg by mouth daily., Until Discontinued, Historical Med    bumetanide (BUMEX) 1 MG tablet Take 1 tablet (1 mg total) by mouth daily. Or as directed by cardiologist, Starting 09/20/2015, Until Discontinued, No Print    Cholecalciferol (VITAMIN D3) 2000 units TABS Take 2,000 Units by mouth daily., Until Discontinued, Historical Med    clopidogrel (PLAVIX) 75 MG tablet Take 1 tablet (75 mg total) by mouth daily., Starting 07/26/2015, Until Discontinued, Normal    ferrous sulfate 325 (65 FE) MG tablet Take 650 mg by mouth 2 (two) times daily. , Until Discontinued, Historical Med    gabapentin (NEURONTIN) 400 MG capsule Take 1 capsule by mouth 3 (three) times daily as needed (for pain). , Until Discontinued, Historical Med    isosorbide mononitrate (IMDUR) 60 MG 24 hr tablet Take 60 mg by mouth daily., Until Discontinued, Historical Med    levothyroxine (SYNTHROID, LEVOTHROID) 25 MCG tablet Take 25 mcg by mouth  daily., Starting 05/26/2015, Until Discontinued, Historical Med    LORazepam (ATIVAN) 1 MG tablet Take 1 mg by mouth at bedtime. , Until Discontinued, Historical Med    nitroGLYCERIN (NITROSTAT) 0.4 MG SL tablet Place 0.4 mg under the tongue every 5 (five) minutes as needed for chest pain., Until Discontinued, Historical Med    pantoprazole (PROTONIX) 40 MG tablet Take 40 mg by mouth daily.,  Starting 05/02/2015, Until Discontinued, Historical Med    potassium chloride SA (K-DUR,KLOR-CON) 20 MEQ tablet Take 1 tablet (20 mEq total) by mouth daily., Starting 09/19/2015, Until Discontinued, No Print    rOPINIRole (REQUIP) 2 MG tablet Take 4 mg by mouth at bedtime. , Until Discontinued, Historical Med    spironolactone (ALDACTONE) 25 MG tablet Take 25 mg by mouth daily., Starting 05/25/2015, Until Discontinued, Historical Med    vitamin B-12 (CYANOCOBALAMIN) 1000 MCG tablet Take 1,000 mcg by mouth daily., Until Discontinued, Historical Med      STOP taking these medications     citalopram (CELEXA) 40 MG tablet         Today   VITAL SIGNS:  Blood pressure 111/63, pulse 84, temperature 97.8 F (36.6 C), temperature source Axillary, resp. rate 17, height 6\' 1"  (1.854 m), weight 130 kg (286 lb 9.6 oz), SpO2 96 %.  I/O:  No intake or output data in the 24 hours ending 10/04/15 1033  PHYSICAL EXAMINATION:  Physical Exam  GENERAL:  70 y.o.-year-old patient lying in the bed with no acute distress.  LUNGS: Normal breath sounds bilaterally, no wheezing, rales,rhonchi or crepitation. No use of accessory muscles of respiration.  CARDIOVASCULAR: S1, S2 normal. No murmurs, rubs, or gallops.  ABDOMEN: Soft, non-tender, non-distended. Bowel sounds present. No organomegaly or mass.  NEUROLOGIC: Moves all 4 extremities. PSYCHIATRIC: The patient is alert and oriented x 3.  SKIN: No obvious rash, lesion, or ulcer.   Bilateral lower extremity lymphadenopathy  DATA REVIEW:   CBC  Recent Labs Lab 09/30/15 0704  WBC 3.5*  HGB 10.9*  HCT 31.3*  PLT 105*    Chemistries   Recent Labs Lab 09/29/15 0408 09/29/15 1006  09/30/15 0704  NA 137 135  --  137  K 5.5* 5.4*  < > 3.8  CL 105 103  --  103  CO2 23 26  --  29  GLUCOSE 100* 100*  --  95  BUN 41* 39*  --  33*  CREATININE 1.22 1.33*  --  1.12  CALCIUM 8.6* 8.4*  --  8.3*  MG  --  2.1  --   --   AST 26  --   --   --    ALT 21  --   --   --   ALKPHOS 71  --   --   --   BILITOT 0.8  --   --   --   < > = values in this interval not displayed.  Cardiac Enzymes  Recent Labs Lab 09/29/15 2110  TROPONINI 0.04*    Microbiology Results  Results for orders placed or performed during the hospital encounter of 09/29/15  MRSA PCR Screening     Status: None   Collection Time: 09/29/15  9:00 AM  Result Value Ref Range Status   MRSA by PCR NEGATIVE NEGATIVE Final    Comment:        The GeneXpert MRSA Assay (FDA approved for NASAL specimens only), is one component of a comprehensive MRSA colonization surveillance program. It is not intended to  diagnose MRSA infection nor to guide or monitor treatment for MRSA infections.     RADIOLOGY:  No results found.  Follow up with PCP in 1 week.  Management plans discussed with the patient, family and they are in agreement.  CODE STATUS:  Code Status History    Date Active Date Inactive Code Status Order ID Comments User Context   10/01/2015  4:36 AM  Full Code [id number]  Shari Prows, MD Inpatient   09/29/2015  9:13 AM 10/01/2015  4:36 AM Full Code [id number]  Gery Pray, MD Inpatient   09/18/2015  6:06 AM 09/19/2015 11:39 AM DNR [id number]  Clydie Braun, MD ED   09/18/2015  5:36 AM 09/18/2015  6:06 AM Full Code [id number]  Clydie Braun, MD ED   07/24/2015 10:23 AM 07/26/2015  3:38 PM DNR [id number]  Houston Siren, MD Inpatient   07/24/2015  2:36 AM 07/24/2015 10:23 AM Full Code [id number]  Ihor Austin, MD ED    Advance Directive Documentation        Most Recent Value   Type of Advance Directive  Living will   Pre-existing out of facility DNR order (yellow form or pink MOST form)     "MOST" Form in Place?        TOTAL TIME TAKING CARE OF THIS PATIENT ON DAY OF DISCHARGE: more than 30 minutes.   Milagros Loll R M.D on 10/04/2015 at 10:33 AM  Between 7am to 6pm - Pager - [phone]  After 6pm go to [email or web address] - password [email or web address]  [facility]  [facility]  Office  [phone]  CC: Primary care physician; Pasty Spillers McLean-Scocozza, MD  Note: This dictation was prepared with Dragon dictation along with smaller phrase technology. Any transcriptional errors that result from this process are unintentional.

## 2015-10-04 NOTE — [facility] Suicide Risk Assessment (Signed)
[facility]   Principal Problem: Major depressive disorder, single episode, severe without psychosis (HCC) Discharge Diagnoses:  Patient Active Problem List   Diagnosis Date Noted  . Alcohol use disorder, severe, dependence (HCC) [F10.20] 10/04/2015  . 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 CHF (congestive heart failure) (HCC) [I50.9] 10/03/2015  . Hypothyroidism [E03.9] 10/01/2015  . B12 deficiency [E53.8] 10/01/2015  . Major depressive disorder, single episode, severe without psychosis (HCC) [F32.2] 10/01/2015  . Overdose [T50.901A] 09/29/2015  . CAD (coronary artery disease) [I25.10] 09/29/2015  . Suicide attempt (HCC) [T14.91] 09/29/2015  . Normochromic normocytic anemia [D64.9]   . Thrombocytopenia (HCC) [D69.6]   . Obstructive sleep apnea [G47.33] 07/26/2015  . Restless leg syndrome [G25.81] 07/26/2015  . Essential hypertension [I10] 07/26/2015  . Lymphedema [I89.0] 07/26/2015  . Obesity [E66.9] 07/26/2015      Psychiatric Specialty Exam: ROS  Blood pressure 135/71, pulse 75, temperature 98.3 F (36.8 C), temperature source Oral, resp. rate 18, height 6\' 1"  (1.854 m), weight 129.275 kg (285 lb).Body mass index is 37.61 kg/(m^2).                                                       Mental Status Per Nursing Assessment::   On Admission:  Suicidal ideation indicated by patient, Self-harm behaviors  Demographic Factors:  Male and Caucasian  Loss Factors: Decline in physical health and Financial problems/change in socioeconomic status  Historical Factors: Prior suicide attempts, Family history of mental illness or substance abuse and Impulsivity  Risk Reduction Factors:   Sense of responsibility to family, Living with another person, especially a relative and Positive social support  Continued Clinical Symptoms:  Depression:   Comorbid alcohol abuse/dependence Alcohol/Substance Abuse/Dependencies Previous  Psychiatric Diagnoses and Treatments Medical Diagnoses and Treatments/Surgeries  Cognitive Features That Contribute To Risk:  None    Suicide Risk:  Minimal: No identifiable suicidal ideation.  Patients presenting with no risk factors but with morbid ruminations; may be classified as minimal risk based on the severity of the depressive symptoms  Follow-up Information    Follow up with [facility]  On 10/05/2015.   Why:  Your hospital follow up is with [name] on June 28th @ 2pm.    Contact information:   [facility]  [address], [address] [place], [place] [address] Phone: [phone] Fax: [phone]      Follow up with [facility] . Go on 10/05/2015.   Why:  Please follow-up with your therapist tomorrow 10/05/15 @ 3pm after your psychiatrist appointment.    Contact information:   [facility]  [address], [address] [place], [place] [address] Phone: [phone]        Jimmy FootmanHernandez-Gonzalez,  Vaniah Chambers, MD 10/05/2015, 9:20 AM

## 2015-10-04 NOTE — Plan of Care (Signed)
Problem: Health Behavior/Discharge Planning: Goal: Compliance with treatment plan for underlying cause of condition will improve Outcome: Progressing Patient medication compliant and attended groups.

## 2015-10-04 NOTE — Plan of Care (Signed)
Problem: [facility] Participation in Recreation Therapeutic Interventions Goal: STG-Patient will demonstrate improved self esteem by identif STG: Self-Esteem - Within 4 treatment sessions, patient will verbalize at least 5 positive affirmation statements in each of 2 treatment sessions to increase self-esteem post d/c.  Outcome: Progressing Treatment Session 1; Completed 1 out of 2: At approximately 1:00 pm, LRT met with patient in community room. Patient verbalized 5 positive affirmation statements. Patient reported it felt "good". LRT encouraged patient to continue saying positive affirmation statements.  Leonette Monarch, LRT/CTRS 06.27.17 4:35 pm

## 2015-10-04 NOTE — B[facility] Group Notes (Signed)
[facility] Group Notes:  (Nursing/MHT/Case Management/Adjunct)  Date:  10/04/2015  Time:  9:40 PM  Type of Therapy:  Group Therapy  Participation Level:  Active  Participation Quality:  Appropriate  Affect:  Appropriate  Cognitive:  Appropriate  Insight:  Appropriate  Engagement in Group:  Engaged  Modes of Intervention:  Discussion  Summary of Progress/Problems:  Jesse [name] 10/04/2015, 9:40 PM

## 2015-10-04 NOTE — Progress Notes (Signed)
D: Patient has been bright throughout the evening. He is visible on the unit and interacts with his peers. He denies SI/HI/AVH and states he's ready for discharge. Respiratory was called for CPAP. Patient denies any pain.  A: Medication was given with education. Encouragement was provided.  R: Patient is compliant with medication. He has remained calm and cooperative. Safety maintained with 15 min checks.

## 2015-10-04 NOTE — Plan of Care (Signed)
Problem: Health Behavior/Discharge Planning: Goal: Compliance with therapeutic regimen will improve Outcome: Progressing Patient has been compliant with medication during this shift

## 2015-10-04 NOTE — Progress Notes (Signed)
[facility]  10/04/2015 8:43 AM Cherlynn PerchesSamuel Bibian  MRN:  [id number] Subjective:  Patient was admitted after he overdosed on Celexa. He tells me this was a very impulsive act. He  has been suffering for depression for many years and this medication was prescribed by his primary care provider. 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  I spoke with the patient's wife as well. She tells me the patient suffers from alcoholism and gambling addiction. She tells me the patient's family suffers from heroin addiction and alcoholism and gambling issues. The patient's father was also a gambler.   Per the wife the patient attempted suicide back in Easter when he makes 6860 Ativan with alcohol. He was found lying on the floor after he had fallen. The patient was on the floor for about 2 nights as stay wife was out of town. The patient was brought in here as he was in rhabdomyolysis and delirium. Patient had a large laceration on his wrist that required sutures. The patient says that at that time the patient denied this was a suicidal attempt but now looking back she can see clearly this was his first attempt.  Patient has multiple charges on credit cards for thousands of dollars. The wife believes he has to pay $15,000 on credit cards. They also recently went fine by Social Security for being over paid. They have to pay them $15,000. In the IRS is asking for $15,000 as well.  Most of the money that they have to pay in the credit cards is related to the gambling problem. She says that he has to spend thousands of dollars on slot machines.  Patient also suffers from alcoholism. She says that he used to drink on a daily basis. However after his hospitalization in ICU backing history he stopped drinking. However the wife said that he pick Alcohol again 2 weeks ago.   Patient says he has remorse and guilt  because of the debt. He has an Brewing technologistinsurance policy that he has looked into and they will pay for any cause of death including suicide.  6/27 Today the patient tells me he is feeling great. He denies having any suicidality, homicidality, problems with his sleep, appetite energy or concentration. He continues to minimize depressive symptoms. Today we talk about his issues with alcohol and he does not feel he is addicted to alcohol. He denies having any side effects from medications or major physical complaints other than having lower extremity edema.  Per Child psychotherapistsocial worker he has a follow-up appointment with a psychiatrist and a therapist tomorrow at 2:00----[facility]    Principal Problem: Major depressive disorder, single episode, severe without psychosis (HCC) Diagnosis:   Patient Active Problem List   Diagnosis Date Noted  . Alcohol use disorder, severe, dependence (HCC) [F10.20] 10/04/2015  . 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 CHF (congestive heart failure) (HCC) [I50.9] 10/03/2015  . Hypothyroidism [E03.9] 10/01/2015  . B12 deficiency [E53.8] 10/01/2015  . Major depressive disorder, single episode, severe without psychosis (HCC) [F32.2] 10/01/2015  . Overdose [T50.901A] 09/29/2015  . CAD (coronary artery disease) [I25.10] 09/29/2015  . Suicide attempt (HCC) [T14.91] 09/29/2015  . Normochromic normocytic anemia [D64.9]   . Thrombocytopenia (HCC) [D69.6]   . Obstructive sleep apnea [G47.33] 07/26/2015  . Restless leg syndrome [G25.81] 07/26/2015  . Essential hypertension [I10] 07/26/2015  . Lymphedema [I89.0]  07/26/2015  . Obesity [E66.9] 07/26/2015   Total Time spent with patient: 30 minutes  Past Psychiatric History: One prior suicidal attempt back in Easter. He was prescribed with citalopram by his primary care provider  Past Medical History:  Past Medical History  Diagnosis Date  . Hypertension   . Diabetes mellitus without complication (HCC)   .  CHF (congestive heart failure) (HCC)   . CAD (coronary artery disease)   . A-fib (HCC)   . Sleep apnea   . Chronic edema     Past Surgical History  Procedure Laterality Date  . Pacemaker insertion    . Cardiac catheterization Right 07/25/2015    Procedure: Left Heart Cath and Coronary Angiography;  Surgeon: Laurier Nancy, MD;  Location: [facility] INVASIVE CV LAB;  Service: Cardiovascular;  Laterality: Right;   Family History:  Family History  Problem Relation Age of Onset  . Diabetes Brother    Family Psychiatric  History: Father suffered from a gambling addiction siblings suffer from heroine and alcohol addiction  Social History:  History  Alcohol Use No     History  Drug Use No    Social History   Social History  . Marital Status: Married    Spouse Name: N/A  . Number of Children: N/A  . Years of Education: N/A   Occupational History  . retired    Social History Main Topics  . Smoking status: Former Games developer  . Smokeless tobacco: None  . Alcohol Use: No  . Drug Use: No  . Sexual Activity: Not Asked   Other Topics Concern  . None   Social History Narrative   Additional Social History:    Pain Medications: see PTA meds Prescriptions: see PTA meds Over the Counter: see PTA meds History of alcohol / drug use?: No history of alcohol / drug abuse    Current Medications: Current Facility-Administered Medications  Medication Dose Route Frequency Provider Last Rate Last Dose  . acetaminophen (TYLENOL) tablet 650 mg  650 mg Oral Q6H PRN Jolanta B Pucilowska, MD      . alum & mag hydroxide-simeth (MAALOX/MYLANTA) 200-200-20 MG/5ML suspension 30 mL  30 mL Oral Q4H PRN Jolanta B Pucilowska, MD      . bumetanide (BUMEX) tablet 1 mg  1 mg Oral Daily Jimmy Footman, MD      . bumetanide (BUMEX) tablet 2 mg  2 mg Oral QHS Jimmy Footman, MD      . cholecalciferol (VITAMIN D) tablet 2,000 Units  2,000 Units Oral Daily Jimmy Footman, MD    2,000 Units at 10/03/15 1218  . clopidogrel (PLAVIX) tablet 75 mg  75 mg Oral Daily Shari Prows, MD   75 mg at 10/03/15 0851  . ferrous sulfate tablet 650 mg  650 mg Oral BID WC Jimmy Footman, MD   650 mg at 10/03/15 1705  . gabapentin (NEURONTIN) capsule 400 mg  400 mg Oral TID Shari Prows, MD   400 mg at 10/03/15 2209  . isosorbide mononitrate (IMDUR) 24 hr tablet 60 mg  60 mg Oral Daily Jolanta B Pucilowska, MD   60 mg at 10/03/15 0851  . levothyroxine (SYNTHROID, LEVOTHROID) tablet 25 mcg  25 mcg Oral QAC breakfast Shari Prows, MD   25 mcg at 10/04/15 0644  . magnesium hydroxide (MILK OF MAGNESIA) suspension 30 mL  30 mL Oral Daily PRN Jolanta B Pucilowska, MD      . naltrexone (DEPADE) tablet 50 mg  50 mg Oral Daily  Jimmy FootmanAndrea Hernandez-Gonzalez, MD   50 mg at 10/03/15 1705  . nitroGLYCERIN (NITROSTAT) SL tablet 0.4 mg  0.4 mg Sublingual Q5 min PRN Jolanta B Pucilowska, MD      . pantoprazole (PROTONIX) EC tablet 40 mg  40 mg Oral QAC breakfast Jolanta B Pucilowska, MD   40 mg at 10/04/15 0645  . potassium chloride (K-DUR,KLOR-CON) CR tablet 20 mEq  20 mEq Oral Daily Jimmy FootmanAndrea Hernandez-Gonzalez, MD   20 mEq at 10/03/15 1218  . rOPINIRole (REQUIP) tablet 4 mg  4 mg Oral QHS Jimmy FootmanAndrea Hernandez-Gonzalez, MD   4 mg at 10/03/15 2209  . sertraline (ZOLOFT) tablet 25 mg  25 mg Oral Daily Jimmy FootmanAndrea Hernandez-Gonzalez, MD   25 mg at 10/03/15 1217  . spironolactone (ALDACTONE) tablet 25 mg  25 mg Oral Daily Shari ProwsJolanta B Pucilowska, MD   25 mg at 10/03/15 0851  . traZODone (DESYREL) tablet 100 mg  100 mg Oral QHS PRN Shari ProwsJolanta B Pucilowska, MD   100 mg at 10/03/15 2210  . vitamin B-12 (CYANOCOBALAMIN) tablet 1,000 mcg  1,000 mcg Oral Daily Shari ProwsJolanta B Pucilowska, MD   1,000 mcg at 10/03/15 0851  . vitamin C (ASCORBIC ACID) tablet 1,000 mg  1,000 mg Oral Daily Jimmy FootmanAndrea Hernandez-Gonzalez, MD   1,000 mg at 10/03/15 1705    Lab Results:  No results found for this or any previous visit  (from the past 48 hour(s)).  Blood Alcohol level:  Lab Results  Component Value Date   [facility] 09/29/2015   ETH <5 07/23/2015    Metabolic Disorder Labs: Lab Results  Component Value Date   HGBA1C 5.4 10/01/2015   Lab Results  Component Value Date   PROLACTIN 15.6* 10/01/2015   Lab Results  Component Value Date   CHOL 98 09/29/2015   TRIG 153* 09/29/2015   HDL 40* 09/29/2015   CHOLHDL 2.5 09/29/2015   VLDL 31 09/29/2015   LDLCALC 27 09/29/2015    Physical Findings: AIMS: Facial and Oral Movements Muscles of Facial Expression: None, normal Lips and Perioral Area: None, normal Jaw: None, normal Tongue: None, normal,Extremity Movements Upper (arms, wrists, hands, fingers): None, normal Lower (legs, knees, ankles, toes): None, normal, Trunk Movements Neck, shoulders, hips: None, normal, Overall Severity Severity of abnormal movements (highest score from questions above): None, normal Incapacitation due to abnormal movements: None, normal Patient's awareness of abnormal movements (rate only patient's report): No Awareness, Dental Status Current problems with teeth and/or dentures?: No Does patient usually wear dentures?: No  CIWA:    COWS:     Musculoskeletal: Strength & Muscle Tone: within normal limits Gait & Station: normal Patient leans: N/A  Psychiatric Specialty Exam: Physical Exam  Constitutional: He is oriented to person, place, and time. He appears well-developed and well-nourished.  HENT:  Head: Normocephalic and atraumatic.  Eyes: Conjunctivae and EOM are normal.  Neck: Normal range of motion.  Respiratory: Effort normal.  Musculoskeletal: Normal range of motion.  Neurological: He is alert and oriented to person, place, and time.    Review of Systems  Constitutional: Negative.   HENT: Negative.   Eyes: Negative.   Respiratory: Negative.   Cardiovascular: Negative.   Gastrointestinal: Negative.   Genitourinary: Negative.   Musculoskeletal:  Negative.   Skin: Negative.   Neurological: Negative.   Endo/Heme/Allergies: Negative.   Psychiatric/Behavioral: Positive for depression and substance abuse.    Blood pressure 126/73, pulse 72, temperature 97.9 F (36.6 C), temperature source Oral, resp. rate 18, height 6\' 1"  (1.854 m), weight 129.275  kg (285 lb).Body mass index is 37.61 kg/(m^2).  General Appearance: Well Groomed  Eye Contact:  Good  Speech:  Clear and Coherent  Volume:  Normal  Mood:  Dysphoric  Affect:  Blunt  Thought Process:  Linear and Descriptions of Associations: Intact  Orientation:  Full (Time, Place, and Person)  Thought Content:  Hallucinations: None  Suicidal Thoughts:  No  Homicidal Thoughts:  No  Memory:  Immediate;   Good Recent;   Good Remote;   Good  Judgement:  Poor  Insight:  Shallow  Psychomotor Activity:  Normal  Concentration:  Concentration: Good and Attention Span: Good  Recall:  Good  Fund of Knowledge:  Good  Language:  Good  Akathisia:  No  Handed:    AIMS (if indicated):     Assets:  Communication Skills Housing Social Support  ADL's:  Intact  Cognition:  WNL  Sleep:  Number of Hours: 7.5     Treatment Plan Summary:  Major depressive disorder the patient has been started on sertraline 25 mg by mouth daily. This medication has been chosen as he has minimal interaction with other medications. And there has been a status that have proven its safety in patients with heart disease  Alcohol dependence and gambling addiction: Patient has been started on naltrexone 50 mg by mouth daily.  There is  evidence that this medication is more effective when significant genetic predisposition is found. In this case it is our multiple relatives with addiction.  Congestive heart failure patient will be continued on Bumex 2 mg by mouth daily and 1 mg at bedtime.  Continue spironolactone 25 mg a day. Patient is also on 2 medications (1 is placebo) which are part of a research study with [facility].    Cardiovascular distress discontinue Plavix 75 mg a day.  Continue nitroglycerin when necessary  Atrial fibrillation continue imdur 60 mg daily  Hypokalemic continue K-Dur 20 milliequivalents a day  Hypothyroidism continue Synthroid 25 g a day. TSH mildly elevated  Restless leg syndrome continue Requip 4 mg a day  Chronic pain continue Neurontin 400 mg 3 times a day  Vitamin D deficiency continue vitamin D 2000 units daily  Vitamin B12 deficiency continue to remain B12 1000 g daily.  B12 >900  Iron deficiency anemia continue vitamin C and ferrous sulfate  GERD continue Protonix 40 mg a day  Lower extremity edema: wound consult for unna boots (compression to decrease edema)  Labs I will order TSH, T3, T4 and vitamin B12 level------B12 wnl, T3 and T4 are pending  Collateral information obtained from wife on 6/26. Our conversation lasted about 30 minutes.  I also contacted you when seen cardiology and having spoken with the patient's nurse practitioner.  Potential discharge will happen tomorrow as patient has his first appointment with a psychiatrist at 2:00 and an appointment with a therapist at 3:00.  Jimmy Footman, MD 10/04/2015, 8:43 AM

## 2015-10-04 NOTE — Progress Notes (Signed)
Recreation Therapy Notes  Date: 06.27.17 Time: 3:00 pm Location: Craft Room  Group Topic: Self-expression  Goal Area(s) Addresses:  Patient will be able to identify a color that represents each emotion. Patient will verbalize benefit of using art as a means of self-expression. Patient will verbalize one emotion experienced while participating in activity.  Behavioral Response: Attentive, Interactive  Intervention: The Colors Within Me  Activity: Patients were given a blank face worksheet and instructed to pick a color for each emotion they were experiencing and show how much of that emotion they were experiencing on the worksheet.  Education: LRT educated patients on other forms of self-expression  Education Outcome: Acknowledges education/In group clarification offered  Clinical Observations/Feedback: Patient completed activity by picking a color for each emotion and showing how much of that emotion he was feeling. Patient contributed to group discussion by stating what emotions he was feeling.  Jacquelynn CreeGreene,Thaddeaus Monica M, LRT/CTRS 10/04/2015 4:29 PM

## 2015-10-04 NOTE — [facility] Group Notes (Addendum)
[facility] LCSW Group Therapy   10/04/2015 11:00 am  Type of Therapy: Group Therapy   Participation Level: Active   Participation Quality: Attentive, Sharing and Supportive   Affect: Appropriate  Cognitive: Alert and Oriented   Insight: Developing/Improving and Engaged   Engagement in Therapy: Developing/Improving and Engaged   Modes of Intervention: Clarification, Confrontation, Discussion, Education, Exploration,  Limit-setting, Orientation, Problem-solving, Rapport Building, Dance movement psychotherapisteality Testing, Socialization and Support  Summary of Progress/Problems: The topic for group therapy was feelings about diagnosis. Pt actively participated in group discussion on their past and current diagnosis and how they feel towards this. Pt also identified how society and family members judge them, based on their diagnosis as well as stereotypes and stigmas.  Patient has limited insight on his diagnosis. He believes that he has only a gambling problem and drinking is not his concern. Pt states that working with his family through his diagnosis could potentially help him and his symptoms. Pt is agreeable to other treatment options at this time because of recent SI/SA. Pt stated that "[he] does not want to let his family down" which is why treatment is important.   Lynden OxfordKadijah R. Dysen Edmondson, LCSWA

## 2015-10-04 NOTE — Discharge Summary (Addendum)
Physician Discharge Summary Note  Patient:  Jesse [name] is an 70 y.o., male MRN:  [id number] DOB:  1946/02/19 Patient phone:  [phone] (home)  Patient address:   [address] [place] [address],  Total Time spent with patient: 45 minutes  Date of Admission:  10/01/2015 Date of Discharge: 10/05/15  Reason for Admission:  overdose  Principal Problem: Major depressive disorder, single episode, severe without psychosis [facility]) Discharge Diagnoses: Patient Active Problem List   Diagnosis Date Noted  . Alcohol use disorder, severe, dependence ([place]) [F10.20] 10/04/2015  . 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 CHF (congestive heart failure) ([place]) [I50.9] 10/03/2015  . Hypothyroidism [E03.9] 10/01/2015  . B12 deficiency [E53.8] 10/01/2015  . Major depressive disorder, single episode, severe without psychosis ([place]) [F32.2] 10/01/2015  . Overdose [T50.901A] 09/29/2015  . CAD (coronary artery disease) [I25.10] 09/29/2015  . Suicide attempt ([place]) [T14.91] 09/29/2015  . Normochromic normocytic anemia [D64.9]   . Thrombocytopenia ([place]) [D69.6]   . Obstructive sleep apnea [G47.33] 07/26/2015  . Restless leg syndrome [G25.81] 07/26/2015  . Essential hypertension [I10] 07/26/2015  . Lymphedema [I89.0] 07/26/2015  . Obesity [E66.9] 07/26/2015   History of Present Illness:   Jesse [name] is a 70 year old male with a history of anxiety and depression.  Chief complaint. "I don't know why I did it."  History of present illness. Information was obtained from the patient and the chart. Jesse [name] is a history of depression and anxiety and has been treated with Celexa for about a year with improvement. In December 2016 he relocated from [place] to our area to be closer to the family. As a result, he had to give up woodworking as he no longer has a shop. Also 6 months ago he started experimental treatment for congestive heart failure at [facility]. He was prescribed to experimental  medications and names of which he does not remember and they are not on the list of his medications that he believes made his depression and anxiety worse. He denies frank symptoms of depression but reports periods of "melancjoly", especially in the evening when there is nothing for him to do. During such melancholy feeling he impulsively overdosed on citalopram. His wife noticed an empty pill box and brought to the hospital. He was briefly hospitalized on the medical floor and transferred to psychiatry for further treatment. The patient is glad to be alive and points out how lucky he is in life. He is financially stable, has a good wife and children, has new granddaughter. He volunteers at the hospice. He stays busy all day long. In spite of her problems he considers himself relatively healthy 70 year old. He is absolutely no explanation for his action. He regrets it and hopes that he will never do it again. He denies severe anxiety and thinks that citalopram has been very helpful. He denies psychotic symptoms or symptoms suggestive of bipolar mania. There is no alcohol or drug problems.  Past psychiatric history. Nothing before current depressive episode by the patient. His wife informed us that the patient attempted suicide recently at least twice. Once by cutting, then by overdose. She removed handguns fro the house. For the past week, every night she had to "talk him out" of suicide, not very successfully.  Family psychiatric history. None reported.  Social history. He is originally from our area. For the past 14 years he lives in [place] and relocated back in December 2016. He lives with his wife. He is retired. He has insurance.   Past Medical  History:  Past Medical History  Diagnosis Date  . Hypertension   . Diabetes mellitus without complication ([place])   . CHF (congestive heart failure) ([place])   . CAD (coronary artery disease)   . A-fib ([place])   . Sleep apnea   . Chronic edema     Past  Surgical History  Procedure Laterality Date  . Pacemaker insertion    . Cardiac catheterization Right 07/25/2015    Procedure: Left Heart Cath and Coronary Angiography;  Surgeon: Dionisio David, MD;  Location: [facility];  Service: Cardiovascular;  Laterality: Right;   Family History: Father had a gambling addiction. There are several relatives were addicted to heroine Family History  Problem Relation Age of Onset  . Diabetes Brother     Social History:  History  Alcohol Use No     History  Drug Use No    Social History   Social History  . Marital Status: Married    Spouse Name: N/A  . Number of Children: N/A  . Years of Education: N/A   Occupational History  . retired    Social History Main Topics  . Smoking status: Former Research scientist (life sciences)  . Smokeless tobacco: None  . Alcohol Use: No  . Drug Use: No  . Sexual Activity: Not Asked   Other Topics Concern  . None   Social History Narrative    Hospital Course:    Patient was admitted after he overdosed on Celexa. He tells me this was a very impulsive act. He has been suffering for depression for many years and this medication was prescribed by his primary care provider. 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  I spoke with the patient's wife as well. She tells me the patient suffers from alcoholism and gambling addiction. She tells me the patient's family suffers from heroin addiction and alcoholism and gambling issues. The patient's father was also a gambler.   Per the wife the patient attempted suicide back in Easter when he makes 25 Ativan with alcohol. He was found lying on the floor after he had fallen. The patient was on the floor for about 2 nights as stay wife was out of town. The patient was brought in here as he was in rhabdomyolysis and delirium. Patient had a large laceration on his wrist that  required sutures. The patient says that at that time the patient denied this was a suicidal attempt but now looking back she can see clearly this was his first attempt.  Patient has multiple charges on credit cards for thousands of dollars. The wife believes he has to pay $15,000 on credit cards. They also recently went fine by Social Security for being over paid. They have to pay them $15,000. In the IRS is asking for $15,000 as well.  Most of the money that they have to pay in the credit cards is related to the gambling problem. She says that he has to spend thousands of dollars on slot machines.  Patient also suffers from alcoholism. She says that he used to drink on a daily basis. However after his hospitalization in ICU backing history he stopped drinking. However the wife said that he pick Alcohol again 2 weeks ago.   Patient says he has remorse and guilt because of the debt. He has an Set designer that he has looked into and they will  pay for any cause of death including suicide.   Major depressive disorder the patient has been started on sertraline 25 mg by mouth daily. This medication has been chosen as he has minimal interaction with other medications. And there has been a status that have proven its safety in patients with heart disease  Alcohol dependence and gambling addiction: Patient has been started on naltrexone 50 mg by mouth daily. There is evidence that this medication is more effective when significant genetic predisposition is found. In this case it is our multiple relatives with addiction.  Congestive heart failure patient will be continued on Bumex 2 mg by mouth daily and 1 mg at bedtime. Continue spironolactone 25 mg a day. Patient is also on 2 medications (1 is placebo) which are part of a research study with [facility].   Cardiovascular distress discontinue Plavix 75 mg a day. Continue nitroglycerin when necessary  Atrial fibrillation continue imdur 60 mg  daily  Hypokalemic continue K-Dur 20 milliequivalents a day  Hypothyroidism continue Synthroid 25 g a day. TSH, T3 and T4 were wnl.  Restless leg syndrome continue Requip 4 mg a day  Chronic pain continue Neurontin 400 mg 3 times a day  Vitamin D deficiency continue vitamin D 2000 units daily  Vitamin B12 deficiency continue to remain B12 1000 g daily. B12 >900  Iron deficiency anemia continue vitamin C and ferrous sulfate.  Hemoglobin today is 11.4  GERD continue Protonix 40 mg a day  Lower extremity edema: wound consult for unna boots (compression to decrease edema)  Labs I will order TSH, T3, T4 and vitamin B12 level------B12 wnl, T3 and T4 are pending  Collateral information obtained from wife on 6/26. Our conversation lasted about 30 minutes. I also contacted you when seen cardiology and having spoken with the patient's nurse practitioner.  On discharge the patient reports significant improvement of mood. He denies suicidality, homicidality or having auditory or visual hallucinations. He denies having major issues with his sleep, appetite, energy or concentration. He denies side effects from medications.  During his stay in the hospital the patient did not display any unsafe or disruptive behaviors. He actively participated in programming. He was calm, pleasant and cooperative. He did not require seclusion, restraints or forced medications.  As far as physical complaints the patient reported having black stools. He has a history of upper GI bleed. However we checked his hemoglobin and actually increased from admission from 10 to 11.4.  Physical Findings: AIMS: Facial and Oral Movements Muscles of Facial Expression: None, normal Lips and Perioral Area: None, normal Jaw: None, normal Tongue: None, normal,Extremity Movements Upper (arms, wrists, hands, fingers): None, normal Lower (legs, knees, ankles, toes): None, normal, Trunk Movements Neck, shoulders, hips: None, normal,  Overall Severity Severity of abnormal movements (highest score from questions above): None, normal Incapacitation due to abnormal movements: None, normal Patient's awareness of abnormal movements (rate only patient's report): No Awareness, Dental Status Current problems with teeth and/or dentures?: No Does patient usually wear dentures?: No  CIWA:    COWS:     Musculoskeletal: Strength & Muscle Tone: within normal limits Gait & Station: normal Patient leans: N/A  Psychiatric Specialty Exam: Physical Exam  Constitutional: He is oriented to person, place, and time. He appears well-developed and well-nourished.  HENT:  Head: Normocephalic and atraumatic.  Eyes: EOM are normal.  Neck: Normal range of motion.  Respiratory: Effort normal.  Musculoskeletal: Normal range of motion.  Neurological: He is alert and oriented to person, place,  and time.    Review of Systems  Constitutional: Negative.   HENT: Negative.   Eyes: Negative.   Respiratory: Negative.   Cardiovascular: Negative.   Gastrointestinal: Positive for blood in stool.  Musculoskeletal: Negative.   Skin: Negative.   Neurological: Negative.   Endo/Heme/Allergies: Negative.   Psychiatric/Behavioral: Positive for depression and substance abuse. Negative for suicidal ideas, hallucinations and memory loss. The patient is not nervous/anxious and does not have insomnia.     Blood pressure 135/71, pulse 75, temperature 98.3 F (36.8 C), temperature source Oral, resp. rate 18, height 6\' 1"  (1.854 m), weight 129.275 kg (285 lb).Body mass index is 37.61 kg/(m^2).  General Appearance: Fairly Groomed  Eye Contact:  Good  Speech:  Clear and Coherent  Volume:  Normal  Mood:  Dysphoric  Affect:  Appropriate and Congruent  Thought Process:  Linear and Descriptions of Associations: Intact  Orientation:  Full (Time, Place, and Person)  Thought Content:  Hallucinations: None  Suicidal Thoughts:  No  Homicidal Thoughts:  No  Memory:   Immediate;   Good Recent;   Good Remote;   Good  Judgement:  Fair  Insight:  Shallow  Psychomotor Activity:  Normal  Concentration:  Concentration: Good and Attention Span: Good  Recall:  Good  Fund of Knowledge:  Good  Language:  Good  Akathisia:  No  Handed:    AIMS (if indicated):     Assets:  Communication Skills Intimacy Social Support  ADL's:  Intact  Cognition:  WNL  Sleep:  Number of Hours: 7.5     Have you used any form of tobacco in the last 30 days? (Cigarettes, Smokeless Tobacco, Cigars, and/or Pipes): No  Has this patient used any form of tobacco in the last 30 days? (Cigarettes, Smokeless Tobacco, Cigars, and/or Pipes) Yes, No  Blood Alcohol level:  Lab Results  Component Value Date   [facility] <5 09/29/2015   ETH <5 07/23/2015    Metabolic Disorder Labs:  Lab Results  Component Value Date   HGBA1C 5.4 10/01/2015   Lab Results  Component Value Date   PROLACTIN 15.6* 10/01/2015   Lab Results  Component Value Date   CHOL 98 09/29/2015   TRIG 153* 09/29/2015   HDL 40* 09/29/2015   CHOLHDL 2.5 09/29/2015   VLDL 31 09/29/2015   LDLCALC 27 09/29/2015   Results for GRANVIL, DJORDJEVIC (MRN Cherlynn Perches) as of 10/04/2015 14:25  Ref. Range 09/30/2015 07:04 10/01/2015 12:54 10/04/2015 06:53                                                                    Vitamin B12 Latest Ref Range: 180-914 pg/mL   914                                                        Prolactin Latest Ref Range: 4.0-15.2 ng/mL  15.6 (H)   Hemoglobin A1C Latest Ref Range: 4.0-6.0 %  5.4          Results for VOSHON, PETRO (MRN Cherlynn Perches) as of 10/05/2015 09:30  Ref. Range 10/03/2015 06:55 10/04/2015 06:53  10/05/2015 06:48  Sodium Latest Ref Range: 135-145 mmol/L   135  Potassium Latest Ref Range: 3.5-5.1 mmol/L   4.3  Chloride Latest Ref Range: 101-111 mmol/L   100 (L)  CO2 Latest Ref Range: 22-32 mmol/L   28  BUN Latest Ref Range: 6-20 mg/dL   26 (H)  Creatinine Latest Ref  Range: 0.61-1.24 mg/dL   8.47  Calcium Latest Ref Range: 8.9-10.3 mg/dL   8.6 (L)  EGFR (Non-African Amer.) Latest Ref Range: >60 mL/min   >60  EGFR (African American) Latest Ref Range: >60 mL/min   >60  Glucose Latest Ref Range: 65-99 mg/dL   88  Anion gap Latest Ref Range: 5-15    7        WBC Latest Ref Range: 3.8-10.6 K/uL   4.0  RBC Latest Ref Range: 4.40-5.90 MIL/uL   3.65 (L)  Hemoglobin Latest Ref Range: 13.0-18.0 g/dL   42.2 (L)  HCT Latest Ref Range: 40.0-52.0 %   32.7 (L)  MCV Latest Ref Range: 80.0-100.0 fL   89.5  MCH Latest Ref Range: 26.0-34.0 pg   31.4  MCHC Latest Ref Range: 32.0-36.0 g/dL   94.6  RDW Latest Ref Range: 11.5-14.5 %   13.9  Platelets Latest Ref Range: 150-440 K/uL   143 (L)  Neutrophils Latest Units: %   68  Lymphocytes Latest Units: %   17  Monocytes Relative Latest Units: %   11  Eosinophil Latest Units: %   3  Basophil Latest Units: %   1  NEUT# Latest Ref Range: 1.4-6.5 K/uL   2.7  Lymphocyte # Latest Ref Range: 1.0-3.6 K/uL   0.7 (L)  Monocyte # Latest Ref Range: 0.2-1.0 K/uL   0.5  Eosinophils Absolute Latest Ref Range: 0-0.7 K/uL   0.1  Basophils Absolute Latest Ref Range: 0-0.1 K/uL   0.0  TSH Latest Ref Range: 0.450-4.500 uIU/mL 3.580    Thyroxine (T4) Latest Ref Range: 4.5-12.0 ug/dL 5.0    Free Thyroxine Index Latest Ref Range: 1.2-4.9  1.4    T3 Uptake Ratio Latest Ref Range: 24-39 % 28       Results for RASHEEN, BELLS (MRN [id number]) as of 10/04/2015 14:25  Ref. Range 09/29/2015 05:46 09/29/2015 06:29  Alcohol, Ethyl (B) Latest Ref Range: <5 mg/dL <5   Amphetamines, Ur Screen Latest Ref Range: NONE DETECTED   NONE DETECTED  Barbiturates, Ur Screen Latest Ref Range: NONE DETECTED   NONE DETECTED  Benzodiazepine, Ur Scrn Latest Ref Range: NONE DETECTED   POSITIVE (A)  Cocaine Metabolite,Ur [place] Latest Ref Range: NONE DETECTED   NONE DETECTED  Methadone Scn, Ur Latest Ref Range: NONE DETECTED   NONE DETECTED  MDMA (Ecstasy)Ur Screen Latest  Ref Range: NONE DETECTED   NONE DETECTED  Cannabinoid 50 Ng, Ur [place] Latest Ref Range: NONE DETECTED   NONE DETECTED  Opiate, Ur Screen Latest Ref Range: NONE DETECTED   NONE DETECTED  Phencyclidine (PCP) Ur S Latest Ref Range: NONE DETECTED   NONE DETECTED  Tricyclic, Ur Screen Latest Ref Range: NONE DETECTED   NONE DETECTED    See Psychiatric Specialty Exam and Suicide Risk Assessment completed by Attending Physician prior to discharge.  Discharge destination:  Home  Is patient on multiple antipsychotic therapies at discharge:  No   Has Patient had three or more failed trials of antipsychotic monotherapy by history:  No  Recommended Plan for Multiple Antipsychotic Therapies: NA      Discharge Instructions    Diet - low  sodium heart healthy    Complete by:  As directed      Diet - low sodium heart healthy    Complete by:  As directed      Increase activity slowly    Complete by:  As directed             Medication List    STOP taking these medications        LORazepam 1 MG tablet  Commonly known as:  ATIVAN      TAKE these medications      Indication   bumetanide 2 MG tablet  Commonly known as:  BUMEX  Take 1 tablet (2 mg total) by mouth daily.  Notes to Patient:  Edema in lower extremities      bumetanide 1 MG tablet  Commonly known as:  BUMEX  Take 1 tablet (1 mg total) by mouth at bedtime.  Notes to Patient:  Edema in lower extremities      clopidogrel 75 MG tablet  Commonly known as:  PLAVIX  Take 1 tablet (75 mg total) by mouth daily.  Notes to Patient:  Cardiovascular disease      ferrous sulfate 325 (65 FE) MG tablet  Take 650 mg by mouth 2 (two) times daily.  Notes to Patient:  Anemia      gabapentin 400 MG capsule  Commonly known as:  NEURONTIN  Take 1 capsule by mouth 3 (three) times daily as needed (for pain).  Notes to Patient:  Chronic pain      isosorbide mononitrate 60 MG 24 hr tablet  Commonly known as:  IMDUR  Take 60 mg by mouth  daily.  Notes to Patient:  Atrial fibrillation      levothyroxine 25 MCG tablet  Commonly known as:  SYNTHROID, LEVOTHROID  Take 25 mcg by mouth daily.  Notes to Patient:  Hypothyroidism      naltrexone 50 MG tablet  Commonly known as:  DEPADE  Take 1 tablet (50 mg total) by mouth daily.  Notes to Patient:  Addiction      nitroGLYCERIN 0.4 MG SL tablet  Commonly known as:  NITROSTAT  Place 0.4 mg under the tongue every 5 (five) minutes as needed for chest pain.  Notes to Patient:  Chest pain      pantoprazole 40 MG tablet  Commonly known as:  PROTONIX  Take 40 mg by mouth daily.  Notes to Patient:  GERD      potassium chloride SA 20 MEQ tablet  Commonly known as:  K-DUR,KLOR-CON  Take 1 tablet (20 mEq total) by mouth daily.  Notes to Patient:  Low potassium      rOPINIRole 2 MG tablet  Commonly known as:  REQUIP  Take 4 mg by mouth at bedtime.  Notes to Patient:  Restless leg syndrome      sertraline 25 MG tablet  Commonly known as:  ZOLOFT  Take 1 tablet (25 mg total) by mouth daily.  Notes to Patient:  Depression      spironolactone 25 MG tablet  Commonly known as:  ALDACTONE  Take 25 mg by mouth daily.  Notes to Patient:  Congestive heart failure      traZODone 100 MG tablet  Commonly known as:  DESYREL  Take 1 tablet (100 mg total) by mouth at bedtime as needed for sleep.  Notes to Patient:  Insomnia   Indication:  Trouble Sleeping     vitamin B-12 1000 MCG tablet  Commonly known  as:  CYANOCOBALAMIN  Take 1,000 mcg by mouth daily.  Notes to Patient:  Vitamin B12 deficiency      vitamin C 1000 MG tablet  Take 1,000 mg by mouth daily.  Notes to Patient:  Improve iron absorption       Vitamin D3 2000 units Tabs  Take 2,000 Units by mouth daily.  Notes to Patient:  Vitamin D3 deficiency        Follow-up Information    Follow up with [facility]  On 10/05/2015.   Why:  Your hospital follow up is with Dr. Gretel Acre on June 28th @ 2pm.     Contact information:   [facility]  [address] [place], [place] [address] Phone: [phone] Fax: [phone]      Follow up with [facility] . Go on 10/05/2015.   Why:  Please follow-up with your therapist tomorrow 10/05/15 @ 3pm after your psychiatrist appointment.    Contact information:   [facility]  [address], [place], [place] [address] Phone: [phone]      Follow up with Lyda Perone, MD.   Specialty:  Internal Medicine   Why:  f/u with primary care. Pt to make appointment   Contact information:   [place] [id number] [phone]      >30 minutes. More than 50% of the time was as spending coordination of care.  Signed: Hildred Priest, MD 10/05/2015, 9:45 AM

## 2015-10-04 NOTE — Plan of Care (Signed)
Problem: Self-Concept: Goal: Ability to disclose and discuss suicidal ideas will improve Outcome: Progressing Pt denies SI at this time.  Problem: Education: Goal: Knowledge of the prescribed therapeutic regimen will improve Outcome: Progressing Pt takes medications as prescribed. He verbalizes understanding of teaching.

## 2015-10-04 NOTE — [facility] Group Notes (Signed)
[facility] Group Notes:  (Nursing/MHT/Case Management/Adjunct)  Date:  10/04/2015  Time:  2:24 PM  Type of Therapy:  Psychoeducational Skills  Participation Level:  Active  Participation Quality:  Appropriate, Attentive and Supportive  Affect:  Appropriate  Cognitive:  Appropriate  Insight:  Appropriate  Engagement in Group:  Engaged and Supportive  Modes of Intervention:  Discussion, Education and Support  Summary of Progress/Problems:  Jesse [name] 10/04/2015, 2:24 PM

## 2015-10-04 NOTE — Progress Notes (Signed)
Patient noted to be pleasant, observed in the dayroom and groups interacting appropriately and remained medication compliant. Medication education done, patient verbalized understanding. Patient also encouraged to elevate his legs d/t bilateral lower extremities edema. He remains a falls risk but no falls noted thus far. Will continue with Q6913min checks  to ensure safety.

## 2015-10-05 ENCOUNTER — Ambulatory Visit (INDEPENDENT_AMBULATORY_CARE_PROVIDER_SITE_OTHER): Payer: 59 | Admitting: Licensed Clinical Social Worker

## 2015-10-05 ENCOUNTER — Ambulatory Visit (INDEPENDENT_AMBULATORY_CARE_PROVIDER_SITE_OTHER): Payer: 59 | Admitting: Psychiatry

## 2015-10-05 ENCOUNTER — Encounter: Payer: Self-pay | Admitting: Psychiatry

## 2015-10-05 VITALS — BP 110/64 | HR 98 | Temp 97.7°F | Ht 73.0 in | Wt 293.4 lb

## 2015-10-05 DIAGNOSIS — F630 Pathological gambling: Secondary | ICD-10-CM

## 2015-10-05 DIAGNOSIS — F322 Major depressive disorder, single episode, severe without psychotic features: Secondary | ICD-10-CM

## 2015-10-05 DIAGNOSIS — F1020 Alcohol dependence, uncomplicated: Secondary | ICD-10-CM | POA: Diagnosis not present

## 2015-10-05 LAB — THYROID PANEL WITH TSH
Free Thyroxine Index: 1.4 (ref 1.2–4.9)
T3 UPTAKE RATIO: 28 % (ref 24–39)
T4 TOTAL: 5 ug/dL (ref 4.5–12.0)
TSH: 3.58 u[IU]/mL (ref 0.450–4.500)

## 2015-10-05 LAB — CBC WITH DIFFERENTIAL/PLATELET
BASOS ABS: 0 10*3/uL (ref 0–0.1)
BASOS PCT: 1 %
EOS ABS: 0.1 10*3/uL (ref 0–0.7)
EOS PCT: 3 %
HCT: 32.7 % — ABNORMAL LOW (ref 40.0–52.0)
Hemoglobin: 11.4 g/dL — ABNORMAL LOW (ref 13.0–18.0)
Lymphocytes Relative: 17 %
Lymphs Abs: 0.7 10*3/uL — ABNORMAL LOW (ref 1.0–3.6)
MCH: 31.4 pg (ref 26.0–34.0)
MCHC: 35 g/dL (ref 32.0–36.0)
MCV: 89.5 fL (ref 80.0–100.0)
Monocytes Absolute: 0.5 10*3/uL (ref 0.2–1.0)
Monocytes Relative: 11 %
NEUTROS PCT: 68 %
Neutro Abs: 2.7 10*3/uL (ref 1.4–6.5)
PLATELETS: 143 10*3/uL — AB (ref 150–440)
RBC: 3.65 MIL/uL — AB (ref 4.40–5.90)
RDW: 13.9 % (ref 11.5–14.5)
WBC: 4 10*3/uL (ref 3.8–10.6)

## 2015-10-05 LAB — BASIC METABOLIC PANEL
Anion gap: 7 (ref 5–15)
BUN: 26 mg/dL — AB (ref 6–20)
CALCIUM: 8.6 mg/dL — AB (ref 8.9–10.3)
CO2: 28 mmol/L (ref 22–32)
CREATININE: 1.01 mg/dL (ref 0.61–1.24)
Chloride: 100 mmol/L — ABNORMAL LOW (ref 101–111)
Glucose, Bld: 88 mg/dL (ref 65–99)
Potassium: 4.3 mmol/L (ref 3.5–5.1)
SODIUM: 135 mmol/L (ref 135–145)

## 2015-10-05 MED ORDER — SERTRALINE HCL 50 MG PO TABS: 50.0000 mg | ORAL_TABLET | Freq: Every day | ORAL | Status: DC

## 2015-10-05 MED ORDER — NALTREXONE HCL 50 MG PO TABS: 50.0000 mg | ORAL_TABLET | Freq: Every day | ORAL | Status: DC

## 2015-10-05 MED ORDER — TRAZODONE HCL 100 MG PO TABS: 100.0000 mg | ORAL_TABLET | Freq: Every evening | ORAL | Status: DC | PRN

## 2015-10-05 NOTE — Progress Notes (Signed)
  [facility] :  Will you be returning to the same living situation after discharge:  Yes,  pt will be returning to [place] to live with his wife At discharge, do you have transportation home?: Yes,  pt will be picked up by his wife Do you have the ability to pay for your medications: Yes,  pt will be provided with prescriptions at discharge  Release of information consent forms completed and in the chart;  Patient's signature needed at discharge.  Patient to Follow up at: Follow-up Information    Follow up with [facility]  On 10/05/2015.   Why:  Your hospital follow up is with [name] on June 28th @ 2pm.    Contact information:   [facility]  [address], [address] [place], [place] [address] Phone: [phone] Fax: [phone]      Follow up with [facility] . Go on 10/05/2015.   Why:  Please follow-up with your therapist tomorrow 10/05/15 @ 3pm after your psychiatrist appointment.    Contact information:   [facility]  [address], [address] [place], [place] [address] Phone: [phone]      Follow up with [name], MD.   Specialty:  Internal Medicine   Why:  [name]/u with primary care. Pt to make appointment   Contact information:   [address] [place] [address] [phone]       Next level of care provider has access to [facility] Link:no  Safety Planning and Suicide Prevention discussed: Yes,  completed with pt  Have you used any form of tobacco in the last 30 days? (Cigarettes, Smokeless Tobacco, Cigars, and/or Pipes): No  Has patient been referred to the Quitline?: N/A patient is not a smoker  Patient has been referred for addiction treatment: N/A  Jesse PeaJonathan [name] Jesse [name] 10/05/2015, 10:05 AM

## 2015-10-05 NOTE — Progress Notes (Signed)
Psychiatric Initial Adult Assessment   Patient Identification: Jesse PerchesSamuel [name] MRN:  [id number] Date of Evaluation:  10/05/2015 Referral Source: Inpt discharge  Chief Complaint:   Chief Complaint    Establish Care; Anxiety; Depression     Visit Diagnosis:    ICD-9-CM ICD-10-CM   1. Severe major depression, single episode, without psychotic features (HCC) 296.23 F32.2   2. 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 Uncomplicated alcohol dependence (HCC) 303.90 F10.20     History of Present Illness:   Patient is a 70 year old married male who was discharged from the inpatient Behavioral Health today presented for initial assessment accompanied by his wife. He was admitted after he overdosed on celexa  which was prescribed by his primary care physician at [facility]. Patient reported that he has long history of gambling and and alcohol use. He reported that he has also attempted suicide in April after overdosing on lorazepam. He stated that he took an overdose of lorazepam and he stayed in the house for 2 days as his wife was out of town and then she returned and found him. He was admitted to the medical floor and discharge. He never followed with a psychiatrist at that time. He was again admitted 10 days ago after he overdosed with Celexa. Patient reported that he continues to spend $400-$600 per day on gambling. His wife was very upset with his addiction, lying and alcohol use. Patient reported that he wants to improve himself and wants to continue taking his medications. He currently denied having any suicidal homicidal ideations or plans. He reported that he does not have any perceptual disturbances.  Associated Signs/Symptoms: Depression Symptoms:  depressed mood, psychomotor retardation, fatigue, feelings of worthlessness/guilt, difficulty concentrating, anxiety, disturbed sleep, (Hypo) Manic Symptoms:  none Anxiety Symptoms:  none Psychotic Symptoms:  none PTSD  Symptoms: Negative NA  Past Psychiatric History:  History of previous suicide attempt in April by overdose on clonazepam. Recently attempted suicide by overdose on antidepressant medication. He has never seen a psychiatrist in the past. Long history of gambling  and alcohol use   Previous Psychotropic Medications:  celexa Lorazepam   Substance Abuse History in the last 12 months:  Yes.   Drinks alcohol on a regular basis  Consequences of Substance Abuse: Negative NA  Past Medical History:  Past Medical History  Diagnosis Date  . Hypertension   . Diabetes mellitus without complication (HCC)   . CHF (congestive heart failure) (HCC)   . CAD (coronary artery disease)   . A-fib (HCC)   . Sleep apnea   . Chronic edema     Past Surgical History  Procedure Laterality Date  . Pacemaker insertion    . Cardiac catheterization Right 07/25/2015    Procedure: Left Heart Cath and Coronary Angiography;  Surgeon: Jesse NancyShaukat A Khan, MD;  Location: [facility] INVASIVE CV LAB;  Service: Cardiovascular;  Laterality: Right;  . Gastric bypass    . Carpal tunnel release      Family Psychiatric History:  Sister was a heroine addict, now deceased. She committed suicide at age 69.   Family History:  Family History  Problem Relation Age of Onset  . Diabetes Brother   . Alcohol abuse Father   . Drug abuse Sister     Social History:   Social History   Social History  . Marital Status: Married    Spouse Name: N/A  . Number of Children: N/A  . Years of Education: N/A   Occupational History  . retired  Social History Main Topics  . Smoking status: Former Smoker    Types: Cigarettes    Quit date: 04/09/1978  . Smokeless tobacco: Never Used  . Alcohol Use: 13.2 - 14.4 oz/week    0 Standard drinks or equivalent, 0 Glasses of wine, 12 Cans of beer, 10-12 Shots of liquor per week  . Drug Use: No  . Sexual Activity: Not Currently   Other Topics Concern  . None   Social History Narrative     Additional Social History:  Married x 2. Currently married x 42 years.  Has 2 daughters.  [place] moved from [place] to [place]. Still has a house in [place].   Allergies:   Allergies  Allergen Reactions  . Other Hives    baskin robbins cake  . Sulfa Antibiotics Other (See Comments)    hallucinations   . Sulfacetamide Sodium   . Zolpidem Other (See Comments)    Hallucinations; "makes me act stupid" per patient  . Latex Rash  . Tape Rash    Metabolic Disorder Labs: Lab Results  Component Value Date   HGBA1C 5.4 10/01/2015   Lab Results  Component Value Date   PROLACTIN 15.6* 10/01/2015   Lab Results  Component Value Date   CHOL 98 09/29/2015   TRIG 153* 09/29/2015   HDL 40* 09/29/2015   CHOLHDL 2.5 09/29/2015   VLDL 31 09/29/2015   LDLCALC 27 09/29/2015     Current Medications: Current Outpatient Prescriptions  Medication Sig Dispense Refill  . Ascorbic Acid (VITAMIN C) 1000 MG tablet Take 1,000 mg by mouth daily.    . bumetanide (BUMEX) 1 MG tablet Take 1 tablet (1 mg total) by mouth at bedtime.    . bumetanide (BUMEX) 2 MG tablet Take 1 tablet (2 mg total) by mouth daily.    . Cholecalciferol (VITAMIN D3) 2000 units TABS Take 2,000 Units by mouth daily.    . clopidogrel (PLAVIX) 75 MG tablet Take 1 tablet (75 mg total) by mouth daily. 30 tablet 6  . ferrous sulfate 325 (65 FE) MG tablet Take 650 mg by mouth 2 (two) times daily.     Marland Kitchen. gabapentin (NEURONTIN) 400 MG capsule Take 1 capsule by mouth 3 (three) times daily as needed (for pain).   0  . isosorbide mononitrate (IMDUR) 60 MG 24 hr tablet Take 60 mg by mouth daily.    Marland Kitchen. levothyroxine (SYNTHROID, LEVOTHROID) 25 MCG tablet Take 25 mcg by mouth daily.  0  . naltrexone (DEPADE) 50 MG tablet Take 1 tablet (50 mg total) by mouth daily. 30 tablet 0  . nitroGLYCERIN (NITROSTAT) 0.4 MG SL tablet Place 0.4 mg under the tongue every 5 (five) minutes as needed for chest pain.    . pantoprazole (PROTONIX) 40  MG tablet Take 40 mg by mouth daily.  0  . potassium chloride SA (K-DUR,KLOR-CON) 20 MEQ tablet Take 1 tablet (20 mEq total) by mouth daily.    Marland Kitchen. rOPINIRole (REQUIP) 2 MG tablet Take 4 mg by mouth at bedtime.   0  . sertraline (ZOLOFT) 50 MG tablet Take 1 tablet (50 mg total) by mouth daily. 30 tablet 0  . spironolactone (ALDACTONE) 25 MG tablet Take 25 mg by mouth daily.  0  . traZODone (DESYREL) 100 MG tablet Take 1 tablet (100 mg total) by mouth at bedtime as needed for sleep. 30 tablet 1  . vitamin B-12 (CYANOCOBALAMIN) 1000 MCG tablet Take 1,000 mcg by mouth daily.     No current facility-administered medications  for this visit.    Neurologic: Headache: No Seizure: No Paresthesias:No  Musculoskeletal: Strength & Muscle Tone: decreased Gait & Station: normal Patient leans: N/A  Psychiatric Specialty Exam: Review of Systems  Cardiovascular: Positive for chest pain and leg swelling.  Psychiatric/Behavioral: Positive for depression. The patient is nervous/anxious.   All other systems reviewed and are negative.   Blood pressure 110/64, pulse 98, temperature 97.7 F (36.5 C), temperature source Tympanic, height  (1.854 m), weight 293 lb 6.4 oz (133.085 kg), SpO2 95 %.Body mass index is 38.72 kg/(m^2).  General Appearance: Disheveled  Eye Contact:  Fair  Speech:  Clear and Coherent and Normal Rate  Volume:  Decreased  Mood:  Anxious and Depressed  Affect:  Appropriate and Blunt  Thought Process:  Coherent and Goal Directed  Orientation:  Full (Time, Place, and Person)  Thought Content:  Logical  Suicidal Thoughts:  No  Homicidal Thoughts:  No  Memory:  Immediate;   Fair Recent;   Fair Remote;   Fair  Judgement:  Fair  Insight:  Fair  Psychomotor Activity:  Normal  Concentration:  Concentration: Fair and Attention Span: Fair  Recall:  [employer] of Knowledge:Fair  Language: Fair  Akathisia:  No  Handed:  Right  AIMS (if indicated):    Assets:  Communication  Skills Desire for Improvement Physical Health Social Support  ADL's:  Intact  Cognition: WNL  Sleep:      Treatment Plan Summary: Medication management   Discussed with the patient and his wife at length about the medications treatment risks benefits and alternatives I will increase Zoloft 50 mg daily Continue trazodone 100 mg by mouth daily at bedtime when necessary for insomnia Continue naltrexone as prescribed He will continue with the therapy on a regular basis Follow-up in 2 weeks or earlier depending on his symptoms   More than 50% of the time spent in psychoeducation, counseling and coordination of care.    This note was generated in part or whole with voice recognition software. Voice regonition is usually quite accurate but there are transcription errors that can and very often do occur. I apologize for any typographical errors that were not detected and corrected.    Brandy Hale, MD 6/28/20172:49 PM

## 2015-10-05 NOTE — Tx Team (Signed)
Pt was admitted after the previous tx team mtg that the CSW was present for (on 09/30/15) and before the next scheduled tx meeting (on 10/05/15) and as such, a tx team note was not needed.  Dorothe PeaJonathan F. Ayisha Pol, LCSWA, LCAS  10/05/15

## 2015-10-05 NOTE — Consult Note (Signed)
WOC wound consult note Reason for Consult:Chronic lymphedema to bilateral lower legs, edema from feet to above knee.  Wears pneumatic compression at home and is discharging around noon today.  Does not want Unnas boots compression applied as he will apply his removable garments once home.   Wound type:Lymphedema Pressure Ulcer POA: N/A Measurement:Generalized edema with chronic skin changes noted Wound WUJ:WJXBbed:none Drainage (amount, consistency, odor) None Periwound:none Dressing procedure/placement/frequency:Open to air at this time.  Will apply pneumatic compression once he goes home today.   Will not follow at this time.  Please re-consult if needed.  Maple HudsonKaren Rockey Guarino RN BSN CWON Pager [phone]
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
## 2015-10-05 NOTE — Plan of Care (Signed)
Problem: [facility] Participation in Recreation Therapeutic Interventions Goal: STG-Patient will demonstrate improved self esteem by identif STG: Self-Esteem - Within 4 treatment sessions, patient will verbalize at least 5 positive affirmation statements in each of 2 treatment sessions to increase self-esteem post d/c.  Outcome: Completed/Met Date Met:  10/05/15 Treatment Session 2; Completed 2 out of 2: At approximately 11:25 am, LRT met with patient in patient room. Patient verbalized 5 positive affirmation statements. Patient reported it felt "good". LRT encouraged patient to continue saying positive affirmation statements.  Leonette Monarch, LRT/CTRS 06.28.17 11:41 am

## 2015-10-05 NOTE — Progress Notes (Signed)
Pt c/o "black stools" this morning. Writer unable to assess stool due to pt flushing it before telling Clinical research associatewriter. Pt requests to have hemoglobin drawn.

## 2015-10-05 NOTE — Progress Notes (Signed)
Recreation Therapy Notes  INPATIENT RECREATION TR PLAN  Patient Details Name: Jesse [name] MRN: [id number] DOB: 1945/09/01 Today's Date: 10/05/2015  Rec Therapy Plan Is patient appropriate for Therapeutic Recreation?: Yes Treatment times per week: At least once a week TR Treatment/Interventions: 1:1 session, Group participation (Comment) (Appropriate participation in daily recreational therapy tx)  Discharge Criteria Pt will be discharged from therapy if:: Treatment goals are met, Discharged Treatment plan/goals/alternatives discussed and agreed upon by:: Patient/family  Discharge Summary Short term goals set: See Care Plan Short term goals met: Complete Progress toward goals comments: One-to-one attended Which groups?: Wellness, Other (Comment) (Self-expression) One-to-one attended: Self-esteem Reason goals not met: N/A Therapeutic equipment acquired: None Reason patient discharged from therapy: Discharge from hospital Pt/family agrees with progress & goals achieved: Yes Date patient discharged from therapy: 10/05/15   Leonette Monarch, LRT/CTRS 10/05/2015, 4:25 PM

## 2015-10-05 NOTE — Progress Notes (Signed)
Patient discharged at this time via wheelchair to wife for transportation. Review recommended plan of care and wife and patient verbalize understanding. Patient acknowledges all belongings have been returned. Safety maintained.

## 2015-10-05 NOTE — Progress Notes (Signed)
D: Pt is pleasant and cooperative this evening. He is seen in the milieu interacting appropriately with staff and peers. Denies SI/HI/AVH at this time. Pt reports that his goal today was "to get my plans in place for after discharge." Pt states that he and his wife have agreed to do weekly "date nights" as a way to cope. Pt gait is steady. A: Emotional support and encouragement provided. Medications administered with education. q15 minute safety checks maintained. R: Pt remains free from harm. Will continue to monitor.

## 2015-10-05 NOTE — [facility] Group Notes (Signed)
[facility] LCSW Group Therapy   10/05/2015  9:30am   Type of Therapy: Group Therapy   Participation Level: Invited but did not attend.   Participation Quality: Invited but did not attend.  Lynden Oxfordkadijah R. Shyana Kulakowski, MSW, Theresia MajorsLCSWA

## 2015-10-05 NOTE — Progress Notes (Signed)
Patient with appropriate affect, cooperative behavior with meals, meds and plan of care. No SI/HI at this time. Patient to discharge today when discharge plan and transportation in place. Patient meets with wound care for leg wrap and reports he has compression stockings at home. Safety maintained.

## 2015-10-13 ENCOUNTER — Ambulatory Visit (INDEPENDENT_AMBULATORY_CARE_PROVIDER_SITE_OTHER): Payer: 59 | Admitting: Licensed Clinical Social Worker

## 2015-10-13 DIAGNOSIS — F1020 Alcohol dependence, uncomplicated: Secondary | ICD-10-CM

## 2015-10-13 DIAGNOSIS — F630 Pathological gambling: Secondary | ICD-10-CM

## 2015-10-13 DIAGNOSIS — F322 Major depressive disorder, single episode, severe without psychotic features: Secondary | ICD-10-CM | POA: Diagnosis not present

## 2015-10-16 ENCOUNTER — Encounter (HOSPITAL_COMMUNITY): Payer: Self-pay | Admitting: Emergency Medicine

## 2015-10-16 ENCOUNTER — Observation Stay (HOSPITAL_COMMUNITY)
Admission: EM | Admit: 2015-10-16 | Discharge: 2015-10-17 | Disposition: A | Discharge status: Home / Self Care | Payer: Medicare Other | Attending: Internal Medicine | Admitting: Internal Medicine

## 2015-10-16 ENCOUNTER — Emergency Department (HOSPITAL_COMMUNITY): Payer: Medicare Other

## 2015-10-16 DIAGNOSIS — Z66 Do not resuscitate: Secondary | ICD-10-CM | POA: Diagnosis not present

## 2015-10-16 DIAGNOSIS — N179 Acute kidney failure, unspecified: Secondary | ICD-10-CM | POA: Diagnosis not present

## 2015-10-16 DIAGNOSIS — R410 Disorientation, unspecified: Secondary | ICD-10-CM | POA: Diagnosis present

## 2015-10-16 DIAGNOSIS — Z8673 Personal history of transient ischemic attack (TIA), and cerebral infarction without residual deficits: Secondary | ICD-10-CM | POA: Insufficient documentation

## 2015-10-16 DIAGNOSIS — Z950 Presence of cardiac pacemaker: Secondary | ICD-10-CM | POA: Insufficient documentation

## 2015-10-16 DIAGNOSIS — G9340 Encephalopathy, unspecified: Secondary | ICD-10-CM | POA: Diagnosis not present

## 2015-10-16 DIAGNOSIS — I5032 Chronic diastolic (congestive) heart failure: Secondary | ICD-10-CM | POA: Diagnosis present

## 2015-10-16 DIAGNOSIS — Z87891 Personal history of nicotine dependence: Secondary | ICD-10-CM | POA: Diagnosis not present

## 2015-10-16 DIAGNOSIS — I6523 Occlusion and stenosis of bilateral carotid arteries: Secondary | ICD-10-CM | POA: Diagnosis not present

## 2015-10-16 DIAGNOSIS — D638 Anemia in other chronic diseases classified elsewhere: Secondary | ICD-10-CM | POA: Insufficient documentation

## 2015-10-16 DIAGNOSIS — E119 Type 2 diabetes mellitus without complications: Secondary | ICD-10-CM | POA: Insufficient documentation

## 2015-10-16 DIAGNOSIS — F29 Unspecified psychosis not due to a substance or known physiological condition: Secondary | ICD-10-CM

## 2015-10-16 DIAGNOSIS — H538 Other visual disturbances: Secondary | ICD-10-CM

## 2015-10-16 DIAGNOSIS — R4182 Altered mental status, unspecified: Secondary | ICD-10-CM

## 2015-10-16 DIAGNOSIS — E038 Other specified hypothyroidism: Secondary | ICD-10-CM | POA: Diagnosis not present

## 2015-10-16 DIAGNOSIS — E785 Hyperlipidemia, unspecified: Secondary | ICD-10-CM | POA: Diagnosis not present

## 2015-10-16 DIAGNOSIS — I890 Lymphedema, not elsewhere classified: Secondary | ICD-10-CM

## 2015-10-16 DIAGNOSIS — F329 Major depressive disorder, single episode, unspecified: Secondary | ICD-10-CM | POA: Diagnosis not present

## 2015-10-16 DIAGNOSIS — D696 Thrombocytopenia, unspecified: Secondary | ICD-10-CM | POA: Diagnosis present

## 2015-10-16 DIAGNOSIS — I2510 Atherosclerotic heart disease of native coronary artery without angina pectoris: Secondary | ICD-10-CM | POA: Insufficient documentation

## 2015-10-16 DIAGNOSIS — I5042 Chronic combined systolic (congestive) and diastolic (congestive) heart failure: Secondary | ICD-10-CM | POA: Diagnosis not present

## 2015-10-16 DIAGNOSIS — I4891 Unspecified atrial fibrillation: Secondary | ICD-10-CM | POA: Diagnosis not present

## 2015-10-16 DIAGNOSIS — I2582 Chronic total occlusion of coronary artery: Secondary | ICD-10-CM | POA: Insufficient documentation

## 2015-10-16 DIAGNOSIS — Z7902 Long term (current) use of antithrombotics/antiplatelets: Secondary | ICD-10-CM | POA: Insufficient documentation

## 2015-10-16 DIAGNOSIS — I10 Essential (primary) hypertension: Secondary | ICD-10-CM | POA: Diagnosis present

## 2015-10-16 DIAGNOSIS — I255 Ischemic cardiomyopathy: Secondary | ICD-10-CM | POA: Insufficient documentation

## 2015-10-16 DIAGNOSIS — I110 Hypertensive heart disease with heart failure: Secondary | ICD-10-CM | POA: Diagnosis not present

## 2015-10-16 DIAGNOSIS — E039 Hypothyroidism, unspecified: Secondary | ICD-10-CM | POA: Diagnosis present

## 2015-10-16 DIAGNOSIS — R69 Illness, unspecified: Secondary | ICD-10-CM

## 2015-10-16 LAB — CBG MONITORING, ED
GLUCOSE-CAPILLARY: 131 mg/dL — AB (ref 65–99)
Glucose-Capillary: 82 mg/dL (ref 65–99)

## 2015-10-16 LAB — I-STAT CHEM 8, ED
BUN: 43 mg/dL — AB (ref 6–20)
Calcium, Ion: 1 mmol/L — ABNORMAL LOW (ref 1.12–1.23)
Chloride: 102 mmol/L (ref 101–111)
Creatinine, Ser: 1.5 mg/dL — ABNORMAL HIGH (ref 0.61–1.24)
Glucose, Bld: 130 mg/dL — ABNORMAL HIGH (ref 65–99)
HEMATOCRIT: 33 % — AB (ref 39.0–52.0)
HEMOGLOBIN: 11.2 g/dL — AB (ref 13.0–17.0)
Potassium: 4.3 mmol/L (ref 3.5–5.1)
SODIUM: 140 mmol/L (ref 135–145)
TCO2: 27 mmol/L (ref 0–100)

## 2015-10-16 LAB — DIFFERENTIAL
BASOS ABS: 0 10*3/uL (ref 0.0–0.1)
BASOS PCT: 0 %
Eosinophils Absolute: 0.1 10*3/uL (ref 0.0–0.7)
Eosinophils Relative: 3 %
Lymphocytes Relative: 19 %
Lymphs Abs: 1 10*3/uL (ref 0.7–4.0)
MONO ABS: 0.4 10*3/uL (ref 0.1–1.0)
Monocytes Relative: 8 %
NEUTROS ABS: 3.6 10*3/uL (ref 1.7–7.7)
Neutrophils Relative %: 70 %

## 2015-10-16 LAB — I-STAT TROPONIN, ED: Troponin i, poc: 0.01 ng/mL (ref 0.00–0.08)

## 2015-10-16 LAB — CBC
HCT: 35.1 % — ABNORMAL LOW (ref 39.0–52.0)
Hemoglobin: 11.5 g/dL — ABNORMAL LOW (ref 13.0–17.0)
MCH: 30.8 pg (ref 26.0–34.0)
MCHC: 32.8 g/dL (ref 30.0–36.0)
MCV: 94.1 fL (ref 78.0–100.0)
PLATELETS: 128 10*3/uL — AB (ref 150–400)
RBC: 3.73 MIL/uL — ABNORMAL LOW (ref 4.22–5.81)
RDW: 13.8 % (ref 11.5–15.5)
WBC: 5.2 10*3/uL (ref 4.0–10.5)

## 2015-10-16 LAB — COMPREHENSIVE METABOLIC PANEL
ALT: 16 U/L — AB (ref 17–63)
AST: 18 U/L (ref 15–41)
Albumin: 3.7 g/dL (ref 3.5–5.0)
Alkaline Phosphatase: 59 U/L (ref 38–126)
Anion gap: 7 (ref 5–15)
BUN: 43 mg/dL — ABNORMAL HIGH (ref 6–20)
CHLORIDE: 104 mmol/L (ref 101–111)
CO2: 26 mmol/L (ref 22–32)
CREATININE: 1.59 mg/dL — AB (ref 0.61–1.24)
Calcium: 8.2 mg/dL — ABNORMAL LOW (ref 8.9–10.3)
GFR calc non Af Amer: 43 mL/min — ABNORMAL LOW (ref >60)
GFR, EST AFRICAN AMERICAN: 49 mL/min — AB (ref >60)
Glucose, Bld: 135 mg/dL — ABNORMAL HIGH (ref 65–99)
POTASSIUM: 4.3 mmol/L (ref 3.5–5.1)
SODIUM: 137 mmol/L (ref 135–145)
Total Bilirubin: 0.8 mg/dL (ref 0.3–1.2)
Total Protein: 5.9 g/dL — ABNORMAL LOW (ref 6.5–8.1)

## 2015-10-16 LAB — APTT: APTT: 38 s — AB (ref 24–37)

## 2015-10-16 LAB — TSH: TSH: 6.097 u[IU]/mL — ABNORMAL HIGH (ref 0.350–4.500)

## 2015-10-16 LAB — PROTIME-INR
INR: 1.11 (ref 0.00–1.49)
PROTHROMBIN TIME: 14.4 s (ref 11.6–15.2)

## 2015-10-16 LAB — GLUCOSE, CAPILLARY: GLUCOSE-CAPILLARY: 118 mg/dL — AB (ref 65–99)

## 2015-10-16 LAB — AMMONIA: Ammonia: 19 umol/L (ref 9–35)

## 2015-10-16 MED ORDER — ISOSORBIDE MONONITRATE ER 30 MG PO TB24
60.0000 mg | ORAL_TABLET | Freq: Every day | ORAL | Status: DC
  Administered 2015-10-16 – 2015-10-17 (×2): 60 mg via ORAL
  Filled 2015-10-16 (×2): qty 2

## 2015-10-16 MED ORDER — NALTREXONE HCL 50 MG PO TABS
50.0000 mg | ORAL_TABLET | Freq: Every day | ORAL | Status: DC
  Administered 2015-10-16 – 2015-10-17 (×2): 50 mg via ORAL
  Filled 2015-10-16 (×2): qty 1

## 2015-10-16 MED ORDER — SERTRALINE HCL 50 MG PO TABS
50.0000 mg | ORAL_TABLET | Freq: Every day | ORAL | Status: DC
  Administered 2015-10-16 – 2015-10-17 (×2): 50 mg via ORAL
  Filled 2015-10-16 (×2): qty 1

## 2015-10-16 MED ORDER — SODIUM CHLORIDE 0.9 % IV SOLN
INTRAVENOUS | Status: DC
  Administered 2015-10-16: 06:00:00 via INTRAVENOUS

## 2015-10-16 MED ORDER — ROPINIROLE HCL 1 MG PO TABS
4.0000 mg | ORAL_TABLET | Freq: Every day | ORAL | Status: DC
  Administered 2015-10-16: 4 mg via ORAL
  Filled 2015-10-16: qty 4

## 2015-10-16 MED ORDER — CLOPIDOGREL BISULFATE 75 MG PO TABS
75.0000 mg | ORAL_TABLET | Freq: Every day | ORAL | Status: DC
  Administered 2015-10-16 – 2015-10-17 (×2): 75 mg via ORAL
  Filled 2015-10-16 (×2): qty 1

## 2015-10-16 MED ORDER — GABAPENTIN 400 MG PO CAPS
400.0000 mg | ORAL_CAPSULE | Freq: Three times a day (TID) | ORAL | Status: DC | PRN
  Administered 2015-10-16: 400 mg via ORAL
  Filled 2015-10-16: qty 1

## 2015-10-16 MED ORDER — LEVOTHYROXINE SODIUM 50 MCG PO TABS
50.0000 ug | ORAL_TABLET | Freq: Every day | ORAL | Status: DC
  Administered 2015-10-17: 50 ug via ORAL
  Filled 2015-10-16: qty 1

## 2015-10-16 MED ORDER — FERROUS SULFATE 325 (65 FE) MG PO TABS
650.0000 mg | ORAL_TABLET | Freq: Two times a day (BID) | ORAL | Status: DC
  Administered 2015-10-16 – 2015-10-17 (×3): 650 mg via ORAL
  Filled 2015-10-16 (×3): qty 2

## 2015-10-16 MED ORDER — VITAMIN C 500 MG PO TABS
1000.0000 mg | ORAL_TABLET | Freq: Every day | ORAL | Status: DC
  Administered 2015-10-16 – 2015-10-17 (×2): 1000 mg via ORAL
  Filled 2015-10-16 (×2): qty 2

## 2015-10-16 MED ORDER — TRAZODONE HCL 100 MG PO TABS
100.0000 mg | ORAL_TABLET | Freq: Every evening | ORAL | Status: DC | PRN
  Administered 2015-10-16: 100 mg via ORAL
  Filled 2015-10-16: qty 1

## 2015-10-16 MED ORDER — NITROGLYCERIN 0.4 MG SL SUBL: 0.4000 mg | SUBLINGUAL_TABLET | SUBLINGUAL | Status: DC | PRN

## 2015-10-16 MED ORDER — PANTOPRAZOLE SODIUM 40 MG PO TBEC
40.0000 mg | DELAYED_RELEASE_TABLET | Freq: Every day | ORAL | Status: DC
  Administered 2015-10-16 – 2015-10-17 (×2): 40 mg via ORAL
  Filled 2015-10-16 (×2): qty 1

## 2015-10-16 MED ORDER — ENOXAPARIN SODIUM 40 MG/0.4ML ~~LOC~~ SOLN
40.0000 mg | SUBCUTANEOUS | Status: DC
  Administered 2015-10-16 – 2015-10-17 (×2): 40 mg via SUBCUTANEOUS
  Filled 2015-10-16 (×2): qty 0.4

## 2015-10-16 MED ORDER — SPIRONOLACTONE 25 MG PO TABS
25.0000 mg | ORAL_TABLET | Freq: Every day | ORAL | Status: DC
  Administered 2015-10-16 – 2015-10-17 (×2): 25 mg via ORAL
  Filled 2015-10-16 (×2): qty 1

## 2015-10-16 MED ORDER — STROKE: EARLY STAGES OF RECOVERY BOOK
Freq: Once | Status: AC
  Administered 2015-10-16: 1

## 2015-10-16 MED ORDER — LEVOTHYROXINE SODIUM 25 MCG PO TABS
25.0000 ug | ORAL_TABLET | Freq: Every day | ORAL | Status: DC
  Administered 2015-10-16: 25 ug via ORAL
  Filled 2015-10-16: qty 1

## 2015-10-16 MED ORDER — VITAMIN B-12 1000 MCG PO TABS
1000.0000 ug | ORAL_TABLET | Freq: Every day | ORAL | Status: DC
  Administered 2015-10-16 – 2015-10-17 (×2): 1000 ug via ORAL
  Filled 2015-10-16 (×2): qty 1

## 2015-10-16 MED ORDER — VITAMIN D 1000 UNITS PO TABS
2000.0000 [IU] | ORAL_TABLET | Freq: Every day | ORAL | Status: DC
  Administered 2015-10-16 – 2015-10-17 (×2): 2000 [IU] via ORAL
  Filled 2015-10-16 (×2): qty 2

## 2015-10-16 MED ORDER — POTASSIUM CHLORIDE CRYS ER 20 MEQ PO TBCR
20.0000 meq | EXTENDED_RELEASE_TABLET | Freq: Every day | ORAL | Status: DC
  Administered 2015-10-16 – 2015-10-17 (×2): 20 meq via ORAL
  Filled 2015-10-16 (×2): qty 1

## 2015-10-16 NOTE — ED Notes (Signed)
Altered mental status has resolved at this time - patient A&O x 4, remembers being confused. Wife at the bedside.

## 2015-10-16 NOTE — Progress Notes (Signed)
rn went into give bedside report. Pt had taken off bandage. York SpanielSaid it was cutting off his circulation. Night rn made aware.

## 2015-10-16 NOTE — Progress Notes (Signed)
Code Stroke called on 70 y.o male, history of HTN, DM, CHF, CAD, Afib, stroke 1098',  chronic leg edema. Pt currently on Asprin and Plavix. EMS called to home per wife for acute confusion and severe posterior head ache with "aura". Per Wife LSN determined to be around 10 pm this evening. She noticed his acute confusion and head ache, blurred vision and hearing impairment complaints around 11 pm tonight. Labs and CT [facility] scan reviewed per Neurologist Dr. Amada JupiterKirkpatrick as negative for acute findings. NIHSS completed yielding 6 for weakness in legs, confusion, and sensory impairment in right arm only. Weakness in legs is mostly due to previous deficits and leg edema. TPA not given due to mild symptoms, for admit and full stroke work up.

## 2015-10-16 NOTE — Progress Notes (Signed)
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 The patient was watching television when he got up from his recliner to get his remote control. This was the last thing that the patient remembered before arrival to the emergency department. There was a period of 4-5 hours in between where the patient had no recollection. According to the patient's wife, the patient developed difficulty hearing and so I'll bright lights at the beginning of his confusion episode. EMS was activated, the patient was brought to the emergency department. Neurology was consulted. Initial CT of the brain was negative for acute findings.  Assessment/Plan: Acute encephalopathy/visual disturbance -Appreciate neurology consult -Pneumonia 19 -TSH 6.097 -CT angiogram neck when serum creatinine improves -EEG -Cannot perform MRI secondary to pacemaker -Serum B12 914  AKI -likely volume depletion -hold bumex for today -am BMP -baseline creatinine 0.8-1.0  Ischemic cardiomyopathy/CAD -Continue Plavix -07/25/2015 catheterization totally occluded RCA with collaterals -Continue Imdur  Chronic diastolic and systolic CHF -Dry weight appears to be 287-289 -Daily weights -Hold Bumex for today secondary to elevated creatinine -07/24/2015 echo EF 40%, grade 1 daily, PEEP 51 -Continue Aldactone  Atrial fibrillation -Presently in a ventricular paced -Not anticoagulation candidate secondary to history of falls and GI AVMs -CHADS-VASc = 4 -continue plavix  Hypothyroidism -TSH 6.097 -Increased Synthroid dose to 50 g daily   Depression -[facility] for [facility]  attempt--ativan OD -pt's wife has all his meds locked up and she dispenses all meds  DM2?? -10/01/2015 and hemoglobin A1c 5.4 -D/c CBGs -D/C SSI    Disposition Plan:   Home in 1-2 days  Family Communication:   Wife updated at bedside; Total time spent 35 minutes.  Greater than 50% spent counseling and coordinating care.  Consultants:  Neurology  Code Status:  DNR  DVT Prophylaxis:  [place] Lovenox   Procedures: As Listed in Progress Note Above  Antibiotics: None    Subjective: Patient denies fevers, chills, headache, chest pain, dyspnea, nausea, vomiting, diarrhea, abdominal pain, dysuria, hematuria   Objective: Filed Vitals:   10/16/15 0429 10/16/15 0840 10/16/15 1009 10/16/15 1220  BP: 101/61 106/67 117/70 108/70  Pulse: 69 70 71 70  Temp: 98 F (36.7 C) 97.5 F (36.4 C)  97.7 F (36.5 C)  TempSrc: Oral Oral  Oral  Resp: 16 16 16 14   Weight: 131.2 kg (289 lb 3.9 oz)     SpO2: 100% 96% 99% 98%    Intake/Output Summary (Last 24 hours) at 10/16/15 1332 Last data filed at 10/16/15 1159  Gross per 24 hour  Intake    240 ml  Output    320 ml  Net    -80 ml   Weight change:  Exam:   General:  Pt is alert, follows commands appropriately, not in acute distress  HEENT: No icterus, No thrush, No neck mass, [place]/AT  Cardiovascular: RRR, S1/S2, no rubs, no gallops  Respiratory: CTA bilaterally, no wheezing, no crackles, no rhonchi  Abdomen: Soft/+BS, non tender, non distended, no guarding  Extremities: 3+ LE edema, No lymphangitis, No petechiae, No rashes, no synovitis   Data Reviewed: I have personally reviewed following labs and imaging studies Basic Metabolic Panel:  Recent Labs Lab 10/16/15 0134 10/16/15 0141  NA 137 140  K 4.3 4.3  CL 104 102  CO2 26  --   GLUCOSE 135* 130*  BUN 43* 43*  CREATININE 1.59* 1.50*  CALCIUM 8.2*  --    Liver Function Tests:  Recent Labs Lab 10/16/15 0134  AST 18  ALT 16*  ALKPHOS 59  BILITOT 0.8  PROT  5.9*  ALBUMIN 3.7   No results for input(s): LIPASE, AMYLASE in the last 168 hours.  Recent Labs Lab 10/16/15 0809  AMMONIA 19   Coagulation Profile:  Recent Labs Lab 10/16/15 0134  INR 1.11   CBC:  Recent Labs Lab 10/16/15 0134 10/16/15 0141  WBC 5.2  --   NEUTROABS 3.6  --   HGB 11.5* 11.2*  HCT 35.1* 33.0*  MCV 94.1  --   PLT 128*  --    Cardiac Enzymes: No results for input(s): CKTOTAL, CKMB, CKMBINDEX, TROPONINI in the last 168 hours. BNP: Invalid input(s): POCBNP CBG:  Recent Labs Lab 10/16/15 0134 10/16/15 0353  GLUCAP 131* 82   HbA1C: No results for input(s): HGBA1C in the last 72 hours. Urine analysis:    Component Value Date/Time   COLORURINE YELLOW 09/18/2015 0225   APPEARANCEUR CLEAR 09/18/2015 0225   LABSPEC 1.015 09/18/2015 0225   PHURINE 5.0 09/18/2015 0225   GLUCOSEU NEGATIVE 09/18/2015 0225   HGBUR NEGATIVE 09/18/2015 0225   BILIRUBINUR NEGATIVE 09/18/2015 0225   KETONESUR NEGATIVE 09/18/2015 0225   PROTEINUR NEGATIVE 09/18/2015 0225   NITRITE NEGATIVE 09/18/2015 0225   LEUKOCYTESUR NEGATIVE 09/18/2015 0225   Sepsis Labs: (procalcitonin:4,lacticidven:4) )No results found for this or any previous visit (from the past 240 hour(s)).   Scheduled Meds: . cholecalciferol  2,000 Units Oral Daily  . clopidogrel  75 mg Oral Daily  . enoxaparin (LOVENOX) injection  40 mg Subcutaneous Q24H  . ferrous sulfate  650 mg Oral BID WC  . isosorbide mononitrate  60 mg Oral Daily  . levothyroxine  25 mcg Oral QAC breakfast  . naltrexone  50 mg Oral Daily  . pantoprazole  40 mg Oral Daily  . potassium chloride SA  20 mEq Oral Daily  . rOPINIRole  4 mg Oral QHS  . sertraline  50 mg Oral Daily  . spironolactone  25 mg Oral Daily  . vitamin B-12  1,000 mcg Oral Daily  . vitamin C  1,000 mg Oral Daily   Continuous Infusions: . sodium chloride 75 mL/hr at 10/16/15 8119    Procedures/Studies: US Renal  09/18/2015  CLINICAL DATA:   Acute renal failure. History of hypertension, diabetes. EXAM: RENAL / URINARY TRACT ULTRASOUND COMPLETE COMPARISON:  None. FINDINGS: Right Kidney: Length: 12.7 cm. Echogenicity within normal limits. No mass or hydronephrosis visualized. Left Kidney: Length: 12.9 cm. Echogenicity within normal limits. No mass or hydronephrosis visualized. Bladder: Appears normal for degree of bladder distention. IMPRESSION: Negative. Electronically Signed   By: Awilda Metro M.D.   On: 09/18/2015 06:20   Dg Chest Port 1 View  09/18/2015  CLINICAL DATA:  Acute onset of dizziness and shortness of breath. Initial encounter. EXAM: PORTABLE CHEST 1 VIEW COMPARISON:  None. FINDINGS: The lungs are well-aerated. Vascular congestion is noted. Increased interstitial markings raise concern for mild pulmonary edema. No pleural effusion or pneumothorax is seen. The cardiomediastinal silhouette is mildly enlarged. A pacemaker is noted overlying the left chest wall, with a single lead ending overlying the right ventricle. No acute osseous abnormalities are seen. IMPRESSION: Vascular congestion  and mild cardiomegaly. Increased interstitial markings raise concern for mild pulmonary edema. Electronically Signed   By: Roanna Raider M.D.   On: 09/18/2015 03:12   Ct Head Code Stroke W/o Cm  10/16/2015  CLINICAL DATA:  Acute onset of altered mental status and headache. Confusion. Code stroke. Initial encounter. EXAM: CT HEAD WITHOUT CONTRAST TECHNIQUE: Contiguous axial images were obtained from the base of the skull through the vertex without intravenous contrast. COMPARISON:  None. FINDINGS: There is no evidence of acute infarction, mass lesion, or intra- or extra-axial hemorrhage on CT. Prominence of the ventricles and sulci reflects mild cortical volume loss. Mild cerebellar atrophy is noted. A small chronic infarct is noted at the right occipital lobe. Small chronic lacunar infarcts are noted in the external capsule bilaterally. Scattered  foci of decreased attenuation at the white matter of the convexity may reflect remote injury or infarct. The brainstem and fourth ventricle are within normal limits. The cerebral hemispheres demonstrate grossly normal gray-white differentiation. No mass effect or midline shift is seen. There is no evidence of fracture; visualized osseous structures are unremarkable in appearance. The orbits are within normal limits. The paranasal sinuses and mastoid air cells are well-aerated. No significant soft tissue abnormalities are seen. IMPRESSION: 1. No acute intracranial pathology seen on CT. 2. 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 lacunar infarcts at the external capsule bilaterally. 3. Scattered foci of decreased attenuation at the white matter of the convexity may reflect remote injury or infarct. 4. Mild cortical volume loss noted. These results were called by telephone at the time of interpretation on 10/16/2015 at 1:51 am to Dr. Ritta Slot, who verbally acknowledged these results. Electronically Signed   By: Roanna Raider M.D.   On: 10/16/2015 01:56    Adyline Huberty, DO  [facility] Pager [phone]  If 7PM-7AM, please contact night-coverage [email or web address] Password [email or web address] 10/16/2015, 1:32 PM

## 2015-10-16 NOTE — ED Notes (Signed)
Admitting MD at bedside.

## 2015-10-16 NOTE — Progress Notes (Signed)
Pt has dime sized skin tear to right forearm. Dressing applied, rn noted pt had bled through dressing. Changed dressing and applied heavy dressing at 1600, at 1835 pt now has bled through that dressing as well. md made aware.

## 2015-10-16 NOTE — Progress Notes (Signed)
Pt requesting bed alarm be turned off because it goes off every time he goes from supine to sitting on the edge of the bed. Pt prefers to sit on the edge of the bed. rn discussed pts fall risk and said that bed alarm would be kept off if pt promised he would not get out of bed without calling for assistance. Pt stated he just wanted to sit on the side of the bed.

## 2015-10-16 NOTE — ED Provider Notes (Signed)
CSN: [id number]     Arrival date & time 10/16/15  0133 History   By signing my name below, I, Suzan Slick. Elon Spanner, attest that this documentation has been prepared under the direction and in the presence of Arby Barrette, MD.  Electronically Signed: Suzan Slick. Elon Spanner, ED Scribe. 10/16/2015. 2:23 AM.   Chief Complaint  Patient presents with  . Code Stroke   The history is provided by the patient. No language interpreter was used.    HPI Comments: Jaykob Minichiello brought in by EMS is a 70 y.o. 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 Last known normal at 2200 with symptoms discovered at 2330. Wife states pt was watching the [place] race on TV while laying on the couch as normal and got up to turn off the TV. He then returned to the couch for several minutes and suddenly sat up and stating that he was having trouble seeing. Wife states he described visual field as blurry and an aura. Wife states he then said he was having trouble hearing her as she was speaking to him in a loud tone. She states he then became confused as he could not remember who she was, where he was, time, or orientation. Pt also reported an ongoing occipital HA described as "the worst headache of my life" per triage note and pain behind his L ear. 4 baby ASA given prior to arrival. Pt denies any fever, chills, nausea, vomiting, dysuria, or diarrhea. No new weakness or numbness reported.   PCP: Loman Brooklyn, MD    Past Medical History  Diagnosis Date  . Hypertension   . Diabetes mellitus without complication (HCC)   . CHF (congestive heart failure) (HCC)   . CAD (coronary artery disease)   . A-fib (HCC)   . Sleep apnea   . Chronic edema    Past Surgical History  Procedure Laterality Date  . Pacemaker insertion    . Cardiac catheterization Right 07/25/2015    Procedure: Left  Heart Cath and Coronary Angiography;  Surgeon: Laurier Nancy, MD;  Location: [facility] INVASIVE CV LAB;  Service: Cardiovascular;  Laterality: Right;  . Gastric bypass    . Carpal tunnel release     Family History  Problem Relation Age of Onset  . Diabetes Brother   . Alcohol abuse Father   . Drug abuse Sister    Social History  Substance Use Topics  . Smoking status: Former Smoker    Types: Cigarettes    Quit date: 04/09/1978  . Smokeless tobacco: Never Used  . Alcohol Use: 13.2 - 14.4 oz/week    0 Standard drinks or equivalent, 0 Glasses of wine, 12 Cans of beer, 10-12 Shots of liquor per week    Review of Systems  A complete 10 system review of systems was obtained and all systems are negative except as noted in the HPI and PMH.    Allergies  Other; Sulfa antibiotics; Sulfacetamide sodium; Zolpidem; Latex; and Tape  Home Medications   Prior to Admission medications   Medication Sig Start Date End Date Taking? Authorizing Provider  Ascorbic Acid (VITAMIN C) 1000 MG tablet Take 1,000 mg by mouth daily.    Historical Provider, MD  bumetanide (BUMEX) 1 MG tablet Take 1 tablet (1 mg total) by mouth at bedtime. 10/04/15   Jimmy Footman, MD  bumetanide (BUMEX) 2 MG  tablet Take 1 tablet (2 mg total) by mouth daily. 10/04/15   Jimmy Footman, MD  Cholecalciferol (VITAMIN D3) 2000 units TABS Take 2,000 Units by mouth daily.    Historical Provider, MD  clopidogrel (PLAVIX) 75 MG tablet Take 1 tablet (75 mg total) by mouth daily. 07/26/15   Katharina Caper, MD  ferrous sulfate 325 (65 FE) MG tablet Take 650 mg by mouth 2 (two) times daily.     Historical Provider, MD  gabapentin (NEURONTIN) 400 MG capsule Take 1 capsule by mouth 3 (three) times daily as needed (for pain).     Historical Provider, MD  isosorbide mononitrate (IMDUR) 60 MG 24 hr tablet Take 60 mg by mouth daily.    Historical Provider, MD  levothyroxine (SYNTHROID, LEVOTHROID) 25 MCG tablet Take 25 mcg by  mouth daily. 05/26/15   Historical Provider, MD  naltrexone (DEPADE) 50 MG tablet Take 1 tablet (50 mg total) by mouth daily. 10/05/15   Brandy Hale, MD  nitroGLYCERIN (NITROSTAT) 0.4 MG SL tablet Place 0.4 mg under the tongue every 5 (five) minutes as needed for chest pain.    Historical Provider, MD  pantoprazole (PROTONIX) 40 MG tablet Take 40 mg by mouth daily. 05/02/15   Historical Provider, MD  potassium chloride SA (K-DUR,KLOR-CON) 20 MEQ tablet Take 1 tablet (20 mEq total) by mouth daily. 09/19/15   Clanford Cyndie Mull, MD  rOPINIRole (REQUIP) 2 MG tablet Take 4 mg by mouth at bedtime.     Historical Provider, MD  sertraline (ZOLOFT) 50 MG tablet Take 1 tablet (50 mg total) by mouth daily. 10/05/15   Brandy Hale, MD  spironolactone (ALDACTONE) 25 MG tablet Take 25 mg by mouth daily. 05/25/15   Historical Provider, MD  traZODone (DESYREL) 100 MG tablet Take 1 tablet (100 mg total) by mouth at bedtime as needed for sleep. 10/05/15   Brandy Hale, MD  vitamin B-12 (CYANOCOBALAMIN) 1000 MCG tablet Take 1,000 mcg by mouth daily.    Historical Provider, MD   Triage Vitals: BP 104/60 mmHg  Pulse 69  Temp(Src) 97.7 F (36.5 C) (Oral)  Resp 13  SpO2 94%   Physical Exam  Constitutional: No distress.  Obese. Alert but confused. No respiratory distress.  HENT:  Head: Normocephalic and atraumatic.  Eyes: EOM are normal.  Neck: Normal range of motion.  Cardiovascular: Normal rate, regular rhythm and normal heart sounds.  Exam reveals no gallop and no friction rub.   No murmur heard. Distal peripheral pulses 2+  Pulmonary/Chest: Effort normal and breath sounds normal. He has no wheezes. He has no rales.  Abdominal: Soft. Bowel sounds are normal. He exhibits no distension. There is no tenderness. There is no rebound and no guarding.  Musculoskeletal: Normal range of motion.  Lymphedema to BLE; L greater than R.  Neurological: He is alert.  Alert with clear speech but poor short term recall. Cranial  nerves 2-12 intact. Upper extremeties 5/5. chronic decreased sensation to light touch. Lower extremities difficult to assess due to severe lymphedema  and mobility compromise due to lymphedema.  Skin: Skin is warm and dry. He is not diaphoretic.  Psychiatric: He has a normal mood and affect.  Nursing note and vitals reviewed.   ED Course  Procedures (including critical care time)  DIAGNOSTIC STUDIES: Oxygen Saturation is 99% on RA, Normal by my interpretation.    COORDINATION OF CARE: 1:54 AM- Will order blood work, EKG, and imaging. Discussed treatment plan with pt at bedside and pt agreed to plan.  Labs Review Labs Reviewed  APTT - Abnormal; Notable for the following:    aPTT 38 (*)    All other components within normal limits  CBC - Abnormal; Notable for the following:    RBC 3.73 (*)    Hemoglobin 11.5 (*)    HCT 35.1 (*)    Platelets 128 (*)    All other components within normal limits  COMPREHENSIVE METABOLIC PANEL - Abnormal; Notable for the following:    Glucose, Bld 135 (*)    BUN 43 (*)    Creatinine, Ser 1.59 (*)    Calcium 8.2 (*)    Total Protein 5.9 (*)    ALT 16 (*)    GFR calc non Af Amer 43 (*)    GFR calc Af Amer 49 (*)    All other components within normal limits  CBG MONITORING, ED - Abnormal; Notable for the following:    Glucose-Capillary 131 (*)    All other components within normal limits  I-STAT CHEM 8, ED - Abnormal; Notable for the following:    BUN 43 (*)    Creatinine, Ser 1.50 (*)    Glucose, Bld 130 (*)    Calcium, Ion 1.00 (*)    Hemoglobin 11.2 (*)    HCT 33.0 (*)    All other components within normal limits  PROTIME-INR  DIFFERENTIAL  I-STAT TROPOININ, ED  CBG MONITORING, ED    Imaging Review Ct Head Code Stroke W/o Cm  10/16/2015  CLINICAL DATA:  Acute onset of altered mental status and headache. Confusion. Code stroke. Initial encounter. EXAM: CT HEAD WITHOUT CONTRAST TECHNIQUE: Contiguous axial images were obtained from the  base of the skull through the vertex without intravenous contrast. COMPARISON:  None. FINDINGS: There is no evidence of acute infarction, mass lesion, or intra- or extra-axial hemorrhage on CT. Prominence of the ventricles and sulci reflects mild cortical volume loss. Mild cerebellar atrophy is noted. A small chronic infarct is noted at the right occipital lobe. Small chronic lacunar infarcts are noted in the external capsule bilaterally. Scattered foci of decreased attenuation at the white matter of the convexity may reflect remote injury or infarct. The brainstem and fourth ventricle are within normal limits. The cerebral hemispheres demonstrate grossly normal gray-white differentiation. No mass effect or midline shift is seen. There is no evidence of fracture; visualized osseous structures are unremarkable in appearance. The orbits are within normal limits. The paranasal sinuses and mastoid air cells are well-aerated. No significant soft tissue abnormalities are seen. IMPRESSION: 1. No acute intracranial pathology seen on CT. 2. 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 lacunar infarcts at the external capsule bilaterally. 3. Scattered foci of decreased attenuation at the white matter of the convexity may reflect remote injury or infarct. 4. Mild cortical volume loss noted. These results were called by telephone at the time of interpretation on 10/16/2015 at 1:51 am to Dr. Ritta Slot, who verbally acknowledged these results. Electronically Signed   By: Roanna Raider M.D.   On: 10/16/2015 01:56   I have personally reviewed and evaluated these images and lab results as part of my medical decision-making.   EKG Interpretation   Date/Time:  Sunday October 16 2015 01:53:16 EDT Ventricular Rate:  72 PR Interval:    QRS Duration: 173 QT Interval:  458 QTC Calculation: 502 R Axis:   -91 Text Interpretation:  Ventricular-paced complexes No further analysis  attempted due to paced  rhythm Confirmed by Donnald Garre, MD, Lebron Conners [phone])  on  10/16/2015 2:19:20 AM     Consult: Dr. Amada JupiterKirkpatrick has seen the patient as a code stroke presentation. At this time not a candidate for TPA. Plan will be for observation and most likely repeat CT tomorrow. Consult: [facility] for admission MDM   Final diagnoses:  Confusion  Severe comorbid illness   Patient presents with acute onset of confusion and visual complaint. Suspected time of onset is 22:00 based on patient's sitting down to watch television at approximately that time. Patient does not have focal motor deficit. He is severely deconditioned and has severe lymphedema. Predominant deficit is short-term recall with repetitive questioning. Patient is not candidate for MRI. Per Dr. Amada JupiterKirkpatrick, plan will be for observation and likely repeat CT in the a.m.    Arby BarretteMarcy Natnael Biederman, MD 10/16/15 [phone]

## 2015-10-16 NOTE — ED Notes (Signed)
Checked CBG 82, RN Brook informed

## 2015-10-16 NOTE — H&P (Addendum)
History and Physical    Jesse [name] ZOX:[id number] DOB: 07/05/1945 DOA: 10/16/2015  Referring MD/NP/PA: Dr. Donnald GarrePfeiffer PCP: [name], MD  Patient coming from: Home  Chief Complaint: Confusion  HPI: Jesse [name] is a 70 y.o. male with medical history significant of HTN, HLD, diabetes mellitus type 2, hypothyroidism, CHF, atrial fibrillation, s/p PM; who presents with confusion. History is obtained the patient as he is reportedly back to baseline. Last thing that he remembers was watching NASCAR yesterday evening. Thereafter, there is a 4-5 hour In time which he has no recollection of events. Reportedly, after the patient got in his recliner he complained of not being able to see then stated that he was unable to hear. His wife immediately called 911. It was noted that he was very disoriented and did not know who his wife was. Thought that his kids were still children, but they are in their 8340s and 6250s. He also thought that his one daughter was still living, but she is deceased. At this point in time he does not know what caused the symptoms and has never had this happen before.  ED Course: Upon admission to the emergency department patient was evaluated and seen to be afebrile with vitals otherwise within normal limits. Lab work  revealed WBC  5.2, hemoglobin 11.2. Initial CT imaging showed no acute signs of any acute abnormality. Patient was evaluated by neurology. It is recommended that the patient be [facility]  Review of Systems: As per HPI otherwise 10 point review of systems negative.   Past Medical History  Diagnosis Date  . Hypertension   . Diabetes mellitus without complication (HCC)   . CHF (congestive heart failure) (HCC)   . CAD (coronary artery disease)   . A-fib (HCC)   . Sleep apnea   . Chronic edema     Past Surgical History  Procedure Laterality Date  . Pacemaker insertion    . Cardiac catheterization Right 07/25/2015    Procedure:  Left Heart Cath and Coronary Angiography;  Surgeon: Laurier NancyShaukat A Khan, MD;  Location: [facility] INVASIVE CV LAB;  Service: Cardiovascular;  Laterality: Right;  . Gastric bypass    . Carpal tunnel release       reports that he quit smoking about 37 years ago. His smoking use included Cigarettes. He has never used smokeless tobacco. He reports that he does not drink alcohol or use illicit drugs.  Allergies  Allergen Reactions  . Other Hives    baskin robbins cake  . Sulfa Antibiotics Other (See Comments)    hallucinations   . Sulfacetamide Sodium   . Zolpidem Other (See Comments)    Hallucinations; "makes me act stupid" per patient  . Latex Rash  . Tape Rash    Family History  Problem Relation Age of Onset  . Diabetes Brother   . Alcohol abuse Father   . Drug abuse Sister     Prior to Admission medications   Medication Sig Start Date End Date Taking? Authorizing Provider  Ascorbic Acid (VITAMIN C) 1000 MG tablet Take 1,000 mg by mouth daily.    Historical Provider, MD  bumetanide (BUMEX) 1 MG tablet Take 1 tablet (1 mg total) by mouth at bedtime. 10/04/15   Jimmy FootmanAndrea Hernandez-Gonzalez, MD  bumetanide (BUMEX) 2 MG tablet Take 1 tablet (2 mg total) by mouth daily. 10/04/15   Jimmy FootmanAndrea Hernandez-Gonzalez, MD  Cholecalciferol (VITAMIN D3) 2000 units TABS Take 2,000 Units by mouth daily.    Historical  Provider, MD  clopidogrel (PLAVIX) 75 MG tablet Take 1 tablet (75 mg total) by mouth daily. 07/26/15   Katharina Caper, MD  ferrous sulfate 325 (65 FE) MG tablet Take 650 mg by mouth 2 (two) times daily.     Historical Provider, MD  gabapentin (NEURONTIN) 400 MG capsule Take 1 capsule by mouth 3 (three) times daily as needed (for pain).     Historical Provider, MD  isosorbide mononitrate (IMDUR) 60 MG 24 hr tablet Take 60 mg by mouth daily.    Historical Provider, MD  levothyroxine (SYNTHROID, LEVOTHROID) 25 MCG tablet Take 25 mcg by mouth daily. 05/26/15   Historical Provider, MD  naltrexone (DEPADE) 50 MG  tablet Take 1 tablet (50 mg total) by mouth daily. 10/05/15   Brandy Hale, MD  nitroGLYCERIN (NITROSTAT) 0.4 MG SL tablet Place 0.4 mg under the tongue every 5 (five) minutes as needed for chest pain.    Historical Provider, MD  pantoprazole (PROTONIX) 40 MG tablet Take 40 mg by mouth daily. 05/02/15   Historical Provider, MD  potassium chloride SA (K-DUR,KLOR-CON) 20 MEQ tablet Take 1 tablet (20 mEq total) by mouth daily. 09/19/15   Clanford Cyndie Mull, MD  rOPINIRole (REQUIP) 2 MG tablet Take 4 mg by mouth at bedtime.     Historical Provider, MD  sertraline (ZOLOFT) 50 MG tablet Take 1 tablet (50 mg total) by mouth daily. 10/05/15   Brandy Hale, MD  spironolactone (ALDACTONE) 25 MG tablet Take 25 mg by mouth daily. 05/25/15   Historical Provider, MD  traZODone (DESYREL) 100 MG tablet Take 1 tablet (100 mg total) by mouth at bedtime as needed for sleep. 10/05/15   Brandy Hale, MD  vitamin B-12 (CYANOCOBALAMIN) 1000 MCG tablet Take 1,000 mcg by mouth daily.    Historical Provider, MD    Physical Exam:  Constitutional: Elderly male who appears in NAD, calm, comfortable Filed Vitals:   10/16/15 0327 10/16/15 0330 10/16/15 0345 10/16/15 0429  BP:  109/70 115/66 101/61  Pulse:  69 73 69  Temp: 97.8 F (36.6 C)   98 F (36.7 C)  TempSrc:    Oral  Resp:  19 15 16   Weight:    131.2 kg (289 lb 3.9 oz)  SpO2:  99% 98% 100%   Eyes: PERRL, lids and conjunctivae normal ENMT: Mucous membranes are moist. Posterior pharynx clear of any exudate or lesions.Normal dentition.  Neck: normal, supple, no masses, no thyromegaly Respiratory: clear to auscultation bilaterally, no wheezing, no crackles. Normal respiratory effort. No accessory muscle use.  Cardiovascular: Regular rate and rhythm, no murmurs / rubs / gallops. Lymphedema of the bilateral lower extremities. 2+ pedal pulses. No carotid bruits.  Abdomen: no tenderness, no masses palpated. No hepatosplenomegaly. Bowel sounds positive.  Musculoskeletal: no  clubbing / cyanosis. Deformity of the bilateral feet.. Normal muscle tone.  Skin: Venous stasis dermatitis Neurologic: CN 2-12 grossly intact. Sensation intact, DTR normal. Strength 5/5 in all 4.  Psychiatric: Normal judgment and insight. Alert and oriented x 3. Normal mood.     Labs on Admission: I have personally reviewed following labs and imaging studies  CBC:  Recent Labs Lab 10/16/15 0134 10/16/15 0141  WBC 5.2  --   NEUTROABS 3.6  --   HGB 11.5* 11.2*  HCT 35.1* 33.0*  MCV 94.1  --   PLT 128*  --    Basic Metabolic Panel:  Recent Labs Lab 10/16/15 0134 10/16/15 0141  NA 137 140  K 4.3 4.3  CL 104  102  CO2 26  --   GLUCOSE 135* 130*  BUN 43* 43*  CREATININE 1.59* 1.50*  CALCIUM 8.2*  --    GFR: Estimated Creatinine Clearance: 66 mL/min (by C-G formula based on Cr of 1.5). Liver Function Tests:  Recent Labs Lab 10/16/15 0134  AST 18  ALT 16*  ALKPHOS 59  BILITOT 0.8  PROT 5.9*  ALBUMIN 3.7   No results for input(s): LIPASE, AMYLASE in the last 168 hours. No results for input(s): AMMONIA in the last 168 hours. Coagulation Profile:  Recent Labs Lab 10/16/15 0134  INR 1.11   Cardiac Enzymes: No results for input(s): CKTOTAL, CKMB, CKMBINDEX, TROPONINI in the last 168 hours. BNP (last 3 results) No results for input(s): PROBNP in the last 8760 hours. HbA1C: No results for input(s): HGBA1C in the last 72 hours. CBG:  Recent Labs Lab 10/16/15 0134 10/16/15 0353  GLUCAP 131* 82   Lipid Profile: No results for input(s): CHOL, HDL, LDLCALC, TRIG, CHOLHDL, LDLDIRECT in the last 72 hours. Thyroid Function Tests: No results for input(s): TSH, T4TOTAL, FREET4, T3FREE, THYROIDAB in the last 72 hours. Anemia Panel: No results for input(s): VITAMINB12, FOLATE, FERRITIN, TIBC, IRON, RETICCTPCT in the last 72 hours. Urine analysis:    Component Value Date/Time   COLORURINE YELLOW 09/18/2015 0225   APPEARANCEUR CLEAR 09/18/2015 0225   LABSPEC  1.015 09/18/2015 0225   PHURINE 5.0 09/18/2015 0225   GLUCOSEU NEGATIVE 09/18/2015 0225   HGBUR NEGATIVE 09/18/2015 0225   BILIRUBINUR NEGATIVE 09/18/2015 0225   KETONESUR NEGATIVE 09/18/2015 0225   PROTEINUR NEGATIVE 09/18/2015 0225   NITRITE NEGATIVE 09/18/2015 0225   LEUKOCYTESUR NEGATIVE 09/18/2015 0225   Sepsis Labs: No results found for this or any previous visit (from the past 240 hour(s)).   Radiological Exams on Admission: Ct Head Code Stroke W/o Cm  10/16/2015  CLINICAL DATA:  Acute onset of altered mental status and headache. Confusion. Code stroke. Initial encounter. EXAM: CT HEAD WITHOUT CONTRAST TECHNIQUE: Contiguous axial images were obtained from the base of the skull through the vertex without intravenous contrast. COMPARISON:  None. FINDINGS: There is no evidence of acute infarction, mass lesion, or intra- or extra-axial hemorrhage on CT. Prominence of the ventricles and sulci reflects mild cortical volume loss. Mild cerebellar atrophy is noted. A small chronic infarct is noted at the right occipital lobe. Small chronic lacunar infarcts are noted in the external capsule bilaterally. Scattered foci of decreased attenuation at the white matter of the convexity may reflect remote injury or infarct. The brainstem and fourth ventricle are within normal limits. The cerebral hemispheres demonstrate grossly normal gray-white differentiation. No mass effect or midline shift is seen. There is no evidence of fracture; visualized osseous structures are unremarkable in appearance. The orbits are within normal limits. The paranasal sinuses and mastoid air cells are well-aerated. No significant soft tissue abnormalities are seen. IMPRESSION: 1. No acute intracranial pathology seen on CT. 2. 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 lacunar infarcts at the external capsule bilaterally. 3. Scattered foci of decreased attenuation at the white matter of the convexity may reflect  remote injury or infarct. 4. Mild cortical volume loss noted. These results were called by telephone at the time of interpretation on 10/16/2015 at 1:51 am to Dr. Ritta Slot, who verbally acknowledged these results. Electronically Signed   By: Roanna Raider M.D.   On: 10/16/2015 01:56    EKG: Independently reviewed. Ventricularly paced rhythm  Assessment/Plan Acute encephalopathy: Now resolved.  Patient had a previous period of confusion/delirium. On the differential included questionable complex migraine. - Admit to telemetry bed - Neurology consult and recommended  - obtaining a EEG  - Check TSH and ammonia  - repeat CT and 24-48 hrs.  Anemia of chronic disease: Patient notes that hemoglobin is usually somewhere around 10. On admission hemoglobin 11.2. - Continue to monitor  Acute kidney injury : Patient's baseline creatinine just last month have been 1.01, but on presentation creatinine elevated at 1.59 - IV fluids of normal saline at 75 ml/hr - Will need to follow-up repeat BMP in a.m.   Diabetes mellitus type 2: Patient notes that his diabetes is well controlled and that he is on metformin, but has not really had elevated blood sugars since he had the gastric sleeve procedure some years ago.  - Continue to monitor  Combined  heart failure: Last echo in 07/2015 with EF of 40% with grade 1 diastolic dysfunction. - Strict ins and outs  Ventricular pacemaker: Stable  Hypothyroidism - Continue levothyroxine  DVT prophylaxis: Lovenox  Code Status: DNR Family Communication:  No family present at bedside Disposition Plan: Possible discharge home in 1-2 days. Consults called: Neurology Admission status: Observation telemetry  Clydie Braun MD [facility] Pager [phone]  If 7PM-7AM, please contact night-coverage [email or web address] Password [email or web address]  10/16/2015, 5:18 AM

## 2015-10-16 NOTE — ED Notes (Addendum)
PER [facility]: Patient to ED from home presenting with stroke symptoms - AMS, lethargy, blurred vision, "worst headache of his life." Per EMS, patient's wife states that patient could not remember her name or recognize his daughters. Patient alert upon arrival, confused, disoriented. Last known well 2200, symptoms discovered 2330. EMS VS: 94/76, HR 70, paced rhythm on monitor, 99% RA, RR 18, CBG 170. Hx CVA 1998 - no deficits.

## 2015-10-16 NOTE — Consult Note (Signed)
Neurology Consultation Reason for Consult: Altered mental status Referring Physician: Donnald Garre, M  CC: Altered mental status  History is obtained from: Patient  HPI: Jesse [name] is a 70 y.o. male with a history of atrial fibrillation on aspirin plus Plavix due to high risk of falls. He was in his normal state of health around 10 PM, then around 11 his wife noticed that he was not acting his normal self. He compared of having a "aura" which she described as bright light. He became confused and complained of blurred vision. His wife also states that he was complaining of not being able to hear her even though she was speaking quite loudly. A lot of the symptoms seem to be improving, and he has since started complaining of  a headache. He describes it as occipitally based, and is not sure if it was sudden onset or not. Though his NIH is 6, this is mostly due to previous deficits and edema.   LKW: 10 PM tpa given?: no, mild symptoms    ROS: A 14 point ROS was performed and is negative except as noted in the HPI.   Past Medical History  Diagnosis Date  . Hypertension   . Diabetes mellitus without complication (HCC)   . CHF (congestive heart failure) (HCC)   . CAD (coronary artery disease)   . A-fib (HCC)   . Sleep apnea   . Chronic edema      Family History  Problem Relation Age of Onset  . Diabetes Brother   . Alcohol abuse Father   . Drug abuse Sister      Social History:  reports that he quit smoking about 37 years ago. His smoking use included Cigarettes. He has never used smokeless tobacco. He reports that he drinks about 13.2 - 14.4 oz of alcohol per week. He reports that he does not use illicit drugs.   Exam: Current vital signs: BP 110/70 mmHg  Pulse 68  Temp(Src) 97.7 F (36.5 C) (Oral)  Resp 18  SpO2 100% Vital signs in last 24 hours: Temp:  [97.7 F (36.5 C)] 97.7 F (36.5 C) (07/09 0155) Pulse Rate:  [68-69] 68 (07/09 0215) Resp:  [12-18] 18 (07/09  0215) BP: (105-110)/(68-70) 110/70 mmHg (07/09 0215) SpO2:  [99 %-100 %] 100 % (07/09 0215)   Physical Exam  Constitutional: Appears well-developed and well-nourished.  Psych: Affect appropriate to situation Eyes: No scleral injection HENT: No OP obstrucion Head: Normocephalic.  Cardiovascular: Normal rate and regular rhythm.  Respiratory: Effort normal and breath sounds normal to anterior ascultation GI: Soft.  No distension. There is no tenderness.  Skin: WDI Extremities: Massively edematous legs bilaterally  Neuro: Mental Status: Patient is awake, alert, oriented to person, place, but not month or year  When asked his age, response 69 He repeatedly indicates that he wants his wife to answer questions which she says is not normal No signs of aphasia or neglect Cranial Nerves: II: Visual Fields are full. Pupils are equal, round, and reactive to light.   III,IV, VI: EOMI without ptosis or diploplia.  V: Facial sensation is symmetric to temperature VII: Facial movement is symmetric.  VIII: hearing is intact to voice X: Uvula elevates symmetrically XI: Shoulder shrug is symmetric. XII: tongue is midline without atrophy or fasciculations.  Motor: Tone is normal. Bulk is normal. 5/5 strength was present in bilateral arms, he has difficulty raising either leg against gravity, but is able to lift his right leg and hold it, he is  not able to get his left leg off the bed and it is much more edematous than the right. Sensory: Sensation is decreased in his right arm which she states is old Cerebellar: No clear ataxia  I have reviewed labs in epic and the results pertinent to this consultation are: Elevated creatinine at 1.6, 1.01 week ago  I have reviewed the images obtained: CT head-no acute findings  Impression: 70 year old male with acute onset visual change, confusion followed by headache. I do not think that subarachnoid is at all likely given the negative CT within 6 hours of  onset. Vertebral dissection would be one consideration, and I think he will need CT angiogram but I would favor getting his creatinine down slightly prior to doing this as I do not think it will change acute management given that he is already on dual antiplatelet therapy.  One other consideration given his recent overdose of Celexa would be RCVS due to his serotonergic medications with resultant posterior reversible encephalopathy syndrome. Unless imaging support is obtained, I would not favor altering his psychiatric medications.  Finally stroke would be one other possibility and a repeat CT in 48 hours may be helpful for differentiating this.  Recommendations: 1) repeat CT in 24-48 hours 2) CT angiogram head and neck, would favor gentle hydration overnight and doing this tomorrow if his creatinine comes down some. 3) TSH, ammonia 4) EEG   Ritta SlotMcNeill Daelon Dunivan, MD [facility] [phone]  If 7pm- 7am, please page neurology on call as listed in AMION.

## 2015-10-17 ENCOUNTER — Encounter (HOSPITAL_COMMUNITY): Payer: Self-pay

## 2015-10-17 ENCOUNTER — Observation Stay (HOSPITAL_BASED_OUTPATIENT_CLINIC_OR_DEPARTMENT_OTHER)
Admit: 2015-10-17 | Discharge: 2015-10-17 | Disposition: A | Discharge status: Home / Self Care | Payer: Medicare Other | Attending: Internal Medicine | Admitting: Internal Medicine

## 2015-10-17 ENCOUNTER — Observation Stay (HOSPITAL_COMMUNITY): Payer: Medicare Other

## 2015-10-17 DIAGNOSIS — I890 Lymphedema, not elsewhere classified: Secondary | ICD-10-CM | POA: Diagnosis not present

## 2015-10-17 DIAGNOSIS — I5032 Chronic diastolic (congestive) heart failure: Secondary | ICD-10-CM | POA: Diagnosis not present

## 2015-10-17 DIAGNOSIS — G9340 Encephalopathy, unspecified: Secondary | ICD-10-CM | POA: Diagnosis not present

## 2015-10-17 DIAGNOSIS — D696 Thrombocytopenia, unspecified: Secondary | ICD-10-CM | POA: Diagnosis not present

## 2015-10-17 DIAGNOSIS — E038 Other specified hypothyroidism: Secondary | ICD-10-CM | POA: Diagnosis not present

## 2015-10-17 DIAGNOSIS — N179 Acute kidney failure, unspecified: Secondary | ICD-10-CM

## 2015-10-17 LAB — CBC WITH DIFFERENTIAL/PLATELET
BASOS PCT: 1 %
Basophils Absolute: 0 10*3/uL (ref 0.0–0.1)
EOS ABS: 0.1 10*3/uL (ref 0.0–0.7)
EOS PCT: 3 %
HCT: 31.9 % — ABNORMAL LOW (ref 39.0–52.0)
HEMOGLOBIN: 10.7 g/dL — AB (ref 13.0–17.0)
LYMPHS ABS: 0.7 10*3/uL (ref 0.7–4.0)
Lymphocytes Relative: 19 %
MCH: 31.2 pg (ref 26.0–34.0)
MCHC: 33.5 g/dL (ref 30.0–36.0)
MCV: 93 fL (ref 78.0–100.0)
MONOS PCT: 8 %
Monocytes Absolute: 0.3 10*3/uL (ref 0.1–1.0)
NEUTROS PCT: 69 %
Neutro Abs: 2.5 10*3/uL (ref 1.7–7.7)
PLATELETS: 111 10*3/uL — AB (ref 150–400)
RBC: 3.43 MIL/uL — ABNORMAL LOW (ref 4.22–5.81)
RDW: 13.8 % (ref 11.5–15.5)
WBC: 3.6 10*3/uL — AB (ref 4.0–10.5)

## 2015-10-17 LAB — BASIC METABOLIC PANEL
Anion gap: 5 (ref 5–15)
BUN: 28 mg/dL — AB (ref 6–20)
CALCIUM: 8.5 mg/dL — AB (ref 8.9–10.3)
CO2: 28 mmol/L (ref 22–32)
CREATININE: 1.13 mg/dL (ref 0.61–1.24)
Chloride: 108 mmol/L (ref 101–111)
GFR calc non Af Amer: >60 mL/min (ref >60)
Glucose, Bld: 87 mg/dL (ref 65–99)
Potassium: 4.3 mmol/L (ref 3.5–5.1)
SODIUM: 141 mmol/L (ref 135–145)

## 2015-10-17 LAB — MAGNESIUM: MAGNESIUM: 2.2 mg/dL (ref 1.7–2.4)

## 2015-10-17 LAB — GLUCOSE, CAPILLARY
GLUCOSE-CAPILLARY: 63 mg/dL — AB (ref 65–99)
GLUCOSE-CAPILLARY: 87 mg/dL (ref 65–99)
GLUCOSE-CAPILLARY: 79 mg/dL (ref 65–99)
GLUCOSE-CAPILLARY: 95 mg/dL (ref 65–99)
Glucose-Capillary: 100 mg/dL — ABNORMAL HIGH (ref 65–99)

## 2015-10-17 MED ORDER — POTASSIUM CHLORIDE CRYS ER 20 MEQ PO TBCR: 20.0000 meq | EXTENDED_RELEASE_TABLET | Freq: Every day | ORAL | Status: AC

## 2015-10-17 MED ORDER — IOPAMIDOL (ISOVUE-370) INJECTION 76%
INTRAVENOUS | Status: AC
Start: 2015-10-17 — End: 2015-10-17
  Administered 2015-10-17: 50 mL
  Filled 2015-10-17: qty 50

## 2015-10-17 MED ORDER — LISINOPRIL 20 MG PO TABS: 20.0000 mg | ORAL_TABLET | Freq: Every morning | ORAL | Status: AC

## 2015-10-17 NOTE — Consult Note (Addendum)
WOC wound consult note Reason for Consult: Consult requested for right arm skin tear.  Pt fell at home and location has continued to bleed since that time.  Bedside nurse applied a pressure dressing last night which has bled through at this time. Pt states he is on Plavix and asprin. Wound type: Right arm with full thickness skin tear; .1X.2X.1cm Wound bed: Slow oozing of small amt blood, does not stop when pressure is applied. Site appears too small for a suture to be applied.  Dressing procedure/placement/frequency: Applied silver nitrate sticks to attempt to contain bleeding, then xeroform gauze, abd pad and ace wrap for light compression and provided orders for bedside nurse to perform topical treatment daily or PRN strike through of bleeding. It may be difficult to stop the bleeding completely related to aspirin and Plavix. Please re-consult if further assistance is needed.  Thank-you,  Cammie Mcgeeawn Teresia Myint MSN, RN, CWOCN, [place]AP, CNS [phone]

## 2015-10-17 NOTE — Care Management Obs Status (Signed)
MEDICARE OBSERVATION STATUS NOTIFICATION   Patient Details  Name: Jesse PerchesSamuel Northup MRN: [id number] Date of Birth: 11/07/1945   Medicare Observation Status Notification Given:  Yes    Kermit BaloKelli F Jalien Weakland, RN 10/17/2015, 12:39 PM

## 2015-10-17 NOTE — Progress Notes (Signed)
Subjective: No further episodes since admission.  He is dressed and states he is ready to go home.  He has already had his EEG.  Exam: Filed Vitals:   10/17/15 0616 10/17/15 1015  BP: 106/59 101/55  Pulse: 66 75  Temp: 97.6 F (36.4 C) 97.5 F (36.4 C)  Resp: 20 17    HEENT-  Normocephalic, no lesions, without obvious abnormality.  Normal external eye and conjunctiva.  Normal external ears. Normal external nose.  Normal pharynx. Cardiovascular- S1, S2 normal, pulses palpable throughout   Lungs- chest clear, no wheezing, rales, normal symmetric air entry Abdomen- normal findings: bowel sounds normal Lymph-no adenopathy palpable Musculoskeletal- Massively edematous bilateral lower extremities Skin-dry    Gen: Sitting in chair, NAD MS: Alert, oriented x4, speech fluent without aphasia CN:II-XII intact Motor: Bilateral uppers 5/5 bilateral lower extremities 4/5, tone and bulk normal Sensory: Diminished sensation right arm   Pertinent Labs/Diagnostics: EEG Impression: This is a normal EEG for the patients stated age. There were no focal, hemispheric or lateralizing features. No epileptiform activity was recorded. A normal EEG does not exclude the diagnosis of a seizure disorder and if seizure remains high on the list of differential diagnosis, an ambulatory EEG may be of value. Clinical correlation is required.  Cr 1.13, improving TSH 6.097  April Pugh, MSN, RN Neurology [phone]  Impression: 70 yo wm with history of HTN, HLD, DM, diastolic heart failure, hypothyroidism, atrial fib. Presenting to the ED after having an episode of acute confusion, visual changes, and a headache.  He has returned to his baseline this morning and is wishing to go home.   The exact etiology of her symptoms unknown probably multifactorial secondary to hypothyroidism and mild elevated creatinine. Primary team has made changes to his Synthroid to address his TSH, and his creatinine is improving.   CTA head and neck Mild atherosclerotic disease in the carotid arteries bilaterally in the neck. No significant carotid stenosis.  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 This may be a chronic finding. There is hypoperfusion of the left MCA branches compared to that seen on the right.  Atherosclerotic mild stenosis in the cavernous carotid bilaterally  Patient was seen, agrees with nurse practitioner documentation and recommendation.   Zulfiqar Willodean RosenthalAli Turk. MD   10/17/2015, 11:32 AM

## 2015-10-17 NOTE — Procedures (Signed)
HPI:  70 y/o with MS change  TECHNICAL SUMMARY:  A multichannel referential and bipolar montage EEG using the standard international 10-20 system was performed on the patient described as awake, drowsy and briefly asleep.  The dominant background activity consists of 9 hertz activity seen most prominantly over the posterior head region.  The backgound activity is reactive to eye opening and closing procedures.  Low voltage fast (beta) activity is distributed symmetrically and maximally over the anterior head regions.  ACTIVATION:  Stepwise photic stimulation and hyperventilation were not performed.  EPILEPTIFORM ACTIVITY:  There were no spikes, sharp waves or paroxysmal activity.  SLEEP:  Stage 1 and brief stage 2 sleep are noted.   IMPRESSION:  This is a normal EEG for the patients stated age.  There were no focal, hemispheric or lateralizing features.  No epileptiform activity was recorded.  A normal EEG does not exclude the diagnosis of a seizure disorder and if seizure remains high on the list of differential diagnosis, an ambulatory EEG may be of value.  Clinical correlation is required.

## 2015-10-17 NOTE — Care Management Note (Signed)
Case Management Note  Patient Details  Name: Jesse PerchesSamuel Frampton MRN: [id number] Date of Birth: 03/22/1946  Subjective/Objective:                    Action/Plan: Pt discharging home with self care and his wife. No further needs per CM.   Expected Discharge Date:                  Expected Discharge Plan:  Home/Self Care  In-House Referral:     Discharge planning Services  CM Consult  Post Acute Care Choice:    Choice offered to:     DME Arranged:    DME Agency:     HH Arranged:    HH Agency:     Status of Service:  Completed, signed off  If discussed at [employer] Length of Stay Meetings, dates discussed:    Additional Comments:  Kermit BaloKelli F Kiaja Shorty, RN 10/17/2015, 3:59 PM

## 2015-10-17 NOTE — Discharge Summary (Signed)
Physician Discharge Summary  Jesse [name] ZOX:[id number] DOB: 1945-10-17 DOA: 10/16/2015  PCP: Pasty Spillers McLean-Scocozza, MD  Admit date: 10/16/2015 Discharge date: 10/17/2015  Admitted From: Home Disposition:  Home  Recommendations for Outpatient Follow-up:  1. Follow up with PCP in 1-2 weeks 2. 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 The patient was watching television when he got up from his recliner to get his remote control. This was the last thing that the patient remembered before arrival to the emergency department. There was a period of 4-5 hours in between where the patient had no recollection. According to the patient's wife, the patient developed difficulty hearing and so I'll bright lights at the beginning of his confusion episode. EMS was activated, the patient was brought to the emergency department. Neurology was consulted. Initial CT of the brain was negative for acute findings. Neurology was consulted. They recommended CT angiogram of his neck as well as EEG both of which were unremarkable.  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 Hypoperfusion of the left greater than right MCA branches. No vertebral artery dissection. -EEG--Negative for seizure predisposition -Cannot perform MRI secondary to pacemaker -Serum B12 914 -It was felt that the patient's presentation was likely due to volume depletion and acute kidney injury. The patient stated that when he was dried  out from Bumex in the past he would have episodes of confusion. The patient was encouraged to follow-up with his cardiologist in [facility] for further adjustments of his diuretic regimen. He was instructed to weigh himself daily and to keep a log until he follows up with his cardiologist on 10/25/2015.  AKI -likely volume depletion -hold bumex--> serum creatinine improved to 1.13 -Serum creatinine peaked at 1.59 -am BMP -baseline creatinine 0.8-1.0 -Patient was instructed to hold off on restarting his Bumex until 10/19/2015 -hold off starting lisinopril until 10/19/15  Ischemic cardiomyopathy/CAD -Continue Plavix -07/25/2015 catheterization totally occluded RCA with collaterals--no stents placed -Continue Imdur  Chronic diastolic and systolic CHF -Dry weight appears to be 287-289 -Daily weights -Hold Bumex 7/9 and 7/10 secondary to elevated creatinine -07/24/2015 echo EF 40%, grade 1 daily, PEEP 51 -Continue Aldactone -Patient is instructed to restart Bumex on 10/19/2015.  Atrial fibrillation -Presently in a ventricular paced -Not anticoagulation candidate secondary to history of falls and GI AVMs -CHADS-VASc = 4 -continue plavix  Right forearm bleeding -The patient had blood through numerous thick dressings from a skin tear -Ultimately, silver nitrate was administered with improvement of his bleeding  Hypothyroidism -TSH 6.097 -Increased Synthroid dose to 50 g daily  Depression -recent admit to [facility] 6/24-6/28 for suicide attempt--ativan OD -pt's wife has all his meds locked up and she dispenses all meds  DM2?? -10/01/2015 and hemoglobin A1c 5.4 -D/c CBGs -D/C SSI   Discharge Instructions      Discharge Instructions    Diet - low sodium heart healthy    Complete by:  As directed      Increase activity slowly    Complete by:  As directed             Medication List  TAKE these medications        acetaminophen 500 MG tablet  Commonly known as:   TYLENOL  Take 500-1,000 mg by mouth every 6 (six) hours as needed for headache.     atorvastatin 80 MG tablet  Commonly known as:  LIPITOR  Take 80 mg by mouth daily.     bumetanide 2 MG tablet  Commonly known as:  BUMEX  Take 1 tablet (2 mg total) by mouth daily.     bumetanide 1 MG tablet  Commonly known as:  BUMEX  Take 1 tablet (1 mg total) by mouth at bedtime.     carvedilol 3.125 MG tablet  Commonly known as:  COREG  Take 3.125 mg by mouth 2 (two) times daily.     clopidogrel 75 MG tablet  Commonly known as:  PLAVIX  Take 1 tablet (75 mg total) by mouth daily.     ferrous sulfate 325 (65 FE) MG tablet  Take 650 mg by mouth 2 (two) times daily.     gabapentin 400 MG capsule  Commonly known as:  NEURONTIN  Take 2 capsules by mouth at bedtime.     glimepiride 2 MG tablet  Commonly known as:  AMARYL  Take 2 mg by mouth every morning.     Investigational - Study Medication  Valsartan/Placebo 160 mg- Take 1 tablet by mouth two times daily ([facility] 2-yr study)     Investigational - Study Medication  LCZ/696-Placebo 200 mg: Take 1 tablet by mouth two times daily ([facility] 2-yr study)     isosorbide mononitrate 60 MG 24 hr tablet  Commonly known as:  IMDUR  Take 60 mg by mouth daily.     levothyroxine 25 MCG tablet  Commonly known as:  SYNTHROID, LEVOTHROID  Take 25 mcg by mouth daily.     lisinopril 20 MG tablet  Commonly known as:  PRINIVIL,ZESTRIL  Take 1 tablet (20 mg total) by mouth every morning.  Start taking on:  10/19/2015     naltrexone 50 MG tablet  Commonly known as:  DEPADE  Take 1 tablet (50 mg total) by mouth daily.     nitroGLYCERIN 0.4 MG SL tablet  Commonly known as:  NITROSTAT  Place 0.4 mg under the tongue every 5 (five) minutes as needed for chest pain.     pantoprazole 40 MG tablet  Commonly known as:  PROTONIX  Take 40 mg by mouth daily.     potassium chloride SA 20 MEQ tablet  Commonly known as:  K-DUR,KLOR-CON    Take 1 tablet (20 mEq total) by mouth daily.  Start taking on:  10/19/2015     rOPINIRole 2 MG tablet  Commonly known as:  REQUIP  Take 4 mg by mouth at bedtime.     sertraline 50 MG tablet  Commonly known as:  ZOLOFT  Take 1 tablet (50 mg total) by mouth daily.     spironolactone 25 MG tablet  Commonly known as:  ALDACTONE  Take 25 mg by mouth daily.     traZODone 100 MG tablet  Commonly known as:  DESYREL  Take 1 tablet (100 mg total) by mouth at bedtime as needed for sleep.     vitamin B-12 1000 MCG tablet  Commonly known as:  CYANOCOBALAMIN  Take 1,000 mcg by mouth daily.     vitamin C 1000 MG tablet  Take 1,000 mg by mouth daily.     Vitamin D3 2000 units Tabs  Take 2,000 Units  by mouth daily.        Allergies  Allergen Reactions  . Other Hives    baskin robbins cake (preservatives!!)  . Sulfa Antibiotics Other (See Comments)    hallucinations   . Sulfacetamide Sodium     Hallucinations  . Zolpidem Other (See Comments)    Hallucinations; "makes me act stupid" per patient  . Latex Rash  . Tape Rash    Consultations:  Neurology   Procedures/Studies: Ct Angio Head W Or Wo Contrast  10/17/2015  ADDENDUM REPORT: 10/17/2015 15:25 ADDENDUM: These results were called by telephone at the time of interpretation on 10/17/2015 at 3:25 pm to April Pugh who verbally acknowledged these results. Electronically Signed   By: Marlan Palau M.D.   On: 10/17/2015 15:25  10/17/2015  CLINICAL DATA:  Acute encephalopathy.  Confusion EXAM: CT ANGIOGRAPHY HEAD AND NECK TECHNIQUE: Multidetector CT imaging of the head and neck was performed using the standard protocol during bolus administration of intravenous contrast. Multiplanar CT image reconstructions and MIPs were obtained to evaluate the vascular anatomy. Carotid stenosis measurements (when applicable) are obtained utilizing NASCET criteria, using the distal internal carotid diameter as the denominator. CONTRAST:  50 mL Isovue  370 IV COMPARISON:  CT head 10/16/2015 FINDINGS: CT HEAD Brain: Generalized atrophy. Mild chronic microvascular ischemic changes are stable. Negative for acute infarct. Negative for hemorrhage or mass lesion. Calvarium and skull base: Negative Paranasal sinuses: Mild mucosal edema in the left maxillary sinus. No air-fluid level. Mastoid sinus clear bilaterally. Orbits: Negative CTA NECK Aortic arch: Mild atherosclerotic calcification in the aortic arch without dissection or stenosis. Proximal great vessels patent. Lung apices clear. Right carotid system: Mild atherosclerotic disease in the common carotid and the carotid bifurcation. No significant stenosis. Left carotid system: Mild atherosclerotic disease in the left common carotid artery and the bifurcation without significant stenosis. Vertebral arteries:Both vertebral arteries patent to the basilar without stenosis or dissection. Skeleton: Moderate cervical spondylosis. There is diffuse facet degeneration. There is extensive foraminal stenosis on the left at C3-4, C4-5 due to bony overgrowth. Negative for fracture. No acute skeletal abnormality. Other neck: Negative for mass or adenopathy in the neck. CTA HEAD Anterior circulation: Atherosclerotic calcification throughout the right cavernous carotid with mild stenosis. Right anterior and right middle cerebral arteries widely patent. Atherosclerotic calcification throughout the left cavernous carotid artery with mild stenosis. There is occlusion of the left M1 segment with associated calcification. This could be a chronic occlusion. There his hypoperfusion of the left MCA branches which are smaller than expected. There are collaterals in the region of the left M1 segment. Left anterior cerebral artery is widely patent. Posterior circulation: Both vertebral arteries patent to the basilar. PICA patent bilaterally. Basilar widely patent. Superior cerebellar arteries patent bilaterally. Fetal origin of the right  posterior cerebral artery. Both posterior cerebral arteries are patent. Venous sinuses: Patent Anatomic variants: Negative for cerebral aneurysm Delayed phase: No enhancing lesions are seen postcontrast. IMPRESSION: Mild atherosclerotic disease in the carotid arteries bilaterally in the neck. No significant carotid stenosis. Negative for vertebral stenosis or dissection in the neck Occlusion left M1 segment with associated calcification. This may be a chronic finding. There is hypoperfusion of the left MCA branches compared to that seen on the right. Atherosclerotic mild stenosis in the cavernous carotid bilaterally. Electronically Signed: By: Marlan Palau M.D. On: 10/17/2015 15:19   Ct Angio Neck W Or Wo Contrast  10/17/2015  ADDENDUM REPORT: 10/17/2015 15:25 ADDENDUM: These results were called by telephone at  the time of interpretation on 10/17/2015 at 3:25 pm to April Pugh who verbally acknowledged these results. Electronically Signed   By: Marlan Palau M.D.   On: 10/17/2015 15:25  10/17/2015  CLINICAL DATA:  Acute encephalopathy.  Confusion EXAM: CT ANGIOGRAPHY HEAD AND NECK TECHNIQUE: Multidetector CT imaging of the head and neck was performed using the standard protocol during bolus administration of intravenous contrast. Multiplanar CT image reconstructions and MIPs were obtained to evaluate the vascular anatomy. Carotid stenosis measurements (when applicable) are obtained utilizing NASCET criteria, using the distal internal carotid diameter as the denominator. CONTRAST:  50 mL Isovue 370 IV COMPARISON:  CT head 10/16/2015 FINDINGS: CT HEAD Brain: Generalized atrophy. Mild chronic microvascular ischemic changes are stable. Negative for acute infarct. Negative for hemorrhage or mass lesion. Calvarium and skull base: Negative Paranasal sinuses: Mild mucosal edema in the left maxillary sinus. No air-fluid level. Mastoid sinus clear bilaterally. Orbits: Negative CTA NECK Aortic arch: Mild atherosclerotic  calcification in the aortic arch without dissection or stenosis. Proximal great vessels patent. Lung apices clear. Right carotid system: Mild atherosclerotic disease in the common carotid and the carotid bifurcation. No significant stenosis. Left carotid system: Mild atherosclerotic disease in the left common carotid artery and the bifurcation without significant stenosis. Vertebral arteries:Both vertebral arteries patent to the basilar without stenosis or dissection. Skeleton: Moderate cervical spondylosis. There is diffuse facet degeneration. There is extensive foraminal stenosis on the left at C3-4, C4-5 due to bony overgrowth. Negative for fracture. No acute skeletal abnormality. Other neck: Negative for mass or adenopathy in the neck. CTA HEAD Anterior circulation: Atherosclerotic calcification throughout the right cavernous carotid with mild stenosis. Right anterior and right middle cerebral arteries widely patent. Atherosclerotic calcification throughout the left cavernous carotid artery with mild stenosis. There is occlusion of the left M1 segment with associated calcification. This could be a chronic occlusion. There his hypoperfusion of the left MCA branches which are smaller than expected. There are collaterals in the region of the left M1 segment. Left anterior cerebral artery is widely patent. Posterior circulation: Both vertebral arteries patent to the basilar. PICA patent bilaterally. Basilar widely patent. Superior cerebellar arteries patent bilaterally. Fetal origin of the right posterior cerebral artery. Both posterior cerebral arteries are patent. Venous sinuses: Patent Anatomic variants: Negative for cerebral aneurysm Delayed phase: No enhancing lesions are seen postcontrast. IMPRESSION: Mild atherosclerotic disease in the carotid arteries bilaterally in the neck. No significant carotid stenosis. Negative for vertebral stenosis or dissection in the neck Occlusion left M1 segment with associated  calcification. This may be a chronic finding. There is hypoperfusion of the left MCA branches compared to that seen on the right. Atherosclerotic mild stenosis in the cavernous carotid bilaterally. Electronically Signed: By: Marlan Palau M.D. On: 10/17/2015 15:19   US Renal  09/18/2015  CLINICAL DATA:  Acute renal failure. History of hypertension, diabetes. EXAM: RENAL / URINARY TRACT ULTRASOUND COMPLETE COMPARISON:  None. FINDINGS: Right Kidney: Length: 12.7 cm. Echogenicity within normal limits. No mass or hydronephrosis visualized. Left Kidney: Length: 12.9 cm. Echogenicity within normal limits. No mass or hydronephrosis visualized. Bladder: Appears normal for degree of bladder distention. IMPRESSION: Negative. Electronically Signed   By: Awilda Metro M.D.   On: 09/18/2015 06:20   Dg Chest Port 1 View  09/18/2015  CLINICAL DATA:  Acute onset of dizziness and shortness of breath. Initial encounter. EXAM: PORTABLE CHEST 1 VIEW COMPARISON:  None. FINDINGS: The lungs are well-aerated. Vascular congestion is noted. Increased interstitial markings raise concern  for mild pulmonary edema. No pleural effusion or pneumothorax is seen. The cardiomediastinal silhouette is mildly enlarged. A pacemaker is noted overlying the left chest wall, with a single lead ending overlying the right ventricle. No acute osseous abnormalities are seen. IMPRESSION: Vascular congestion and mild cardiomegaly. Increased interstitial markings raise concern for mild pulmonary edema. Electronically Signed   By: Roanna Raider M.D.   On: 09/18/2015 03:12   Ct Head Code Stroke W/o Cm  10/16/2015  CLINICAL DATA:  Acute onset of altered mental status and headache. Confusion. Code stroke. Initial encounter. EXAM: CT HEAD WITHOUT CONTRAST TECHNIQUE: Contiguous axial images were obtained from the base of the skull through the vertex without intravenous contrast. COMPARISON:  None. FINDINGS: There is no evidence of acute infarction, mass  lesion, or intra- or extra-axial hemorrhage on CT. Prominence of the ventricles and sulci reflects mild cortical volume loss. Mild cerebellar atrophy is noted. A small chronic infarct is noted at the right occipital lobe. Small chronic lacunar infarcts are noted in the external capsule bilaterally. Scattered foci of decreased attenuation at the white matter of the convexity may reflect remote injury or infarct. The brainstem and fourth ventricle are within normal limits. The cerebral hemispheres demonstrate grossly normal gray-white differentiation. No mass effect or midline shift is seen. There is no evidence of fracture; visualized osseous structures are unremarkable in appearance. The orbits are within normal limits. The paranasal sinuses and mastoid air cells are well-aerated. No significant soft tissue abnormalities are seen. IMPRESSION: 1. No acute intracranial pathology seen on CT. 2. 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 lacunar infarcts at the external capsule bilaterally. 3. Scattered foci of decreased attenuation at the white matter of the convexity may reflect remote injury or infarct. 4. Mild cortical volume loss noted. These results were called by telephone at the time of interpretation on 10/16/2015 at 1:51 am to Dr. Ritta Slot, who verbally acknowledged these results. Electronically Signed   By: Roanna Raider M.D.   On: 10/16/2015 01:56        Discharge Exam: Filed Vitals:   10/17/15 1015 10/17/15 1406  BP: 101/55 108/73  Pulse: 75 74  Temp: 97.5 F (36.4 C) 97.3 F (36.3 C)  Resp: 17 19   Filed Vitals:   10/17/15 0139 10/17/15 0616 10/17/15 1015 10/17/15 1406  BP: 100/57 106/59 101/55 108/73  Pulse: 68 66 75 74  Temp: 98 F (36.7 C) 97.6 F (36.4 C) 97.5 F (36.4 C) 97.3 F (36.3 C)  TempSrc: Oral Oral Axillary Oral  Resp: 20 20 17 19   Height:      Weight:      SpO2: 98% 95% 100% 95%    General: Pt is alert, awake, not in acute  distress Cardiovascular: RRR, S1/S2 +, no rubs, no gallops Respiratory: CTA bilaterally, no wheezing, no rhonchi Abdominal: Soft, NT, ND, bowel sounds + Extremities: 3 + LE edema, no cyanosis   The results of significant diagnostics from this hospitalization (including imaging, microbiology, ancillary and laboratory) are listed below for reference.    Significant Diagnostic Studies: Ct Angio Head W Or Wo Contrast  10/17/2015  ADDENDUM REPORT: 10/17/2015 15:25 ADDENDUM: These results were called by telephone at the time of interpretation on 10/17/2015 at 3:25 pm to April Pugh who verbally acknowledged these results. Electronically Signed   By: Marlan Palau M.D.   On: 10/17/2015 15:25  10/17/2015  CLINICAL DATA:  Acute encephalopathy.  Confusion EXAM: CT ANGIOGRAPHY HEAD AND NECK TECHNIQUE:  Multidetector CT imaging of the head and neck was performed using the standard protocol during bolus administration of intravenous contrast. Multiplanar CT image reconstructions and MIPs were obtained to evaluate the vascular anatomy. Carotid stenosis measurements (when applicable) are obtained utilizing NASCET criteria, using the distal internal carotid diameter as the denominator. CONTRAST:  50 mL Isovue 370 IV COMPARISON:  CT head 10/16/2015 FINDINGS: CT HEAD Brain: Generalized atrophy. Mild chronic microvascular ischemic changes are stable. Negative for acute infarct. Negative for hemorrhage or mass lesion. Calvarium and skull base: Negative Paranasal sinuses: Mild mucosal edema in the left maxillary sinus. No air-fluid level. Mastoid sinus clear bilaterally. Orbits: Negative CTA NECK Aortic arch: Mild atherosclerotic calcification in the aortic arch without dissection or stenosis. Proximal great vessels patent. Lung apices clear. Right carotid system: Mild atherosclerotic disease in the common carotid and the carotid bifurcation. No significant stenosis. Left carotid system: Mild atherosclerotic disease in the  left common carotid artery and the bifurcation without significant stenosis. Vertebral arteries:Both vertebral arteries patent to the basilar without stenosis or dissection. Skeleton: Moderate cervical spondylosis. There is diffuse facet degeneration. There is extensive foraminal stenosis on the left at C3-4, C4-5 due to bony overgrowth. Negative for fracture. No acute skeletal abnormality. Other neck: Negative for mass or adenopathy in the neck. CTA HEAD Anterior circulation: Atherosclerotic calcification throughout the right cavernous carotid with mild stenosis. Right anterior and right middle cerebral arteries widely patent. Atherosclerotic calcification throughout the left cavernous carotid artery with mild stenosis. There is occlusion of the left M1 segment with associated calcification. This could be a chronic occlusion. There his hypoperfusion of the left MCA branches which are smaller than expected. There are collaterals in the region of the left M1 segment. Left anterior cerebral artery is widely patent. Posterior circulation: Both vertebral arteries patent to the basilar. PICA patent bilaterally. Basilar widely patent. Superior cerebellar arteries patent bilaterally. Fetal origin of the right posterior cerebral artery. Both posterior cerebral arteries are patent. Venous sinuses: Patent Anatomic variants: Negative for cerebral aneurysm Delayed phase: No enhancing lesions are seen postcontrast. IMPRESSION: Mild atherosclerotic disease in the carotid arteries bilaterally in the neck. No significant carotid stenosis. Negative for vertebral stenosis or dissection in the neck Occlusion left M1 segment with associated calcification. This may be a chronic finding. There is hypoperfusion of the left MCA branches compared to that seen on the right. Atherosclerotic mild stenosis in the cavernous carotid bilaterally. Electronically Signed: By: Marlan Palau M.D. On: 10/17/2015 15:19   Ct Angio Neck W Or Wo  Contrast  10/17/2015  ADDENDUM REPORT: 10/17/2015 15:25 ADDENDUM: These results were called by telephone at the time of interpretation on 10/17/2015 at 3:25 pm to April Pugh who verbally acknowledged these results. Electronically Signed   By: Marlan Palau M.D.   On: 10/17/2015 15:25  10/17/2015  CLINICAL DATA:  Acute encephalopathy.  Confusion EXAM: CT ANGIOGRAPHY HEAD AND NECK TECHNIQUE: Multidetector CT imaging of the head and neck was performed using the standard protocol during bolus administration of intravenous contrast. Multiplanar CT image reconstructions and MIPs were obtained to evaluate the vascular anatomy. Carotid stenosis measurements (when applicable) are obtained utilizing NASCET criteria, using the distal internal carotid diameter as the denominator. CONTRAST:  50 mL Isovue 370 IV COMPARISON:  CT head 10/16/2015 FINDINGS: CT HEAD Brain: Generalized atrophy. Mild chronic microvascular ischemic changes are stable. Negative for acute infarct. Negative for hemorrhage or mass lesion. Calvarium and skull base: Negative Paranasal sinuses: Mild mucosal edema in the left maxillary  sinus. No air-fluid level. Mastoid sinus clear bilaterally. Orbits: Negative CTA NECK Aortic arch: Mild atherosclerotic calcification in the aortic arch without dissection or stenosis. Proximal great vessels patent. Lung apices clear. Right carotid system: Mild atherosclerotic disease in the common carotid and the carotid bifurcation. No significant stenosis. Left carotid system: Mild atherosclerotic disease in the left common carotid artery and the bifurcation without significant stenosis. Vertebral arteries:Both vertebral arteries patent to the basilar without stenosis or dissection. Skeleton: Moderate cervical spondylosis. There is diffuse facet degeneration. There is extensive foraminal stenosis on the left at C3-4, C4-5 due to bony overgrowth. Negative for fracture. No acute skeletal abnormality. Other neck: Negative for  mass or adenopathy in the neck. CTA HEAD Anterior circulation: Atherosclerotic calcification throughout the right cavernous carotid with mild stenosis. Right anterior and right middle cerebral arteries widely patent. Atherosclerotic calcification throughout the left cavernous carotid artery with mild stenosis. There is occlusion of the left M1 segment with associated calcification. This could be a chronic occlusion. There his hypoperfusion of the left MCA branches which are smaller than expected. There are collaterals in the region of the left M1 segment. Left anterior cerebral artery is widely patent. Posterior circulation: Both vertebral arteries patent to the basilar. PICA patent bilaterally. Basilar widely patent. Superior cerebellar arteries patent bilaterally. Fetal origin of the right posterior cerebral artery. Both posterior cerebral arteries are patent. Venous sinuses: Patent Anatomic variants: Negative for cerebral aneurysm Delayed phase: No enhancing lesions are seen postcontrast. IMPRESSION: Mild atherosclerotic disease in the carotid arteries bilaterally in the neck. No significant carotid stenosis. Negative for vertebral stenosis or dissection in the neck Occlusion left M1 segment with associated calcification. This may be a chronic finding. There is hypoperfusion of the left MCA branches compared to that seen on the right. Atherosclerotic mild stenosis in the cavernous carotid bilaterally. Electronically Signed: By: Marlan Palau M.D. On: 10/17/2015 15:19   US Renal  09/18/2015  CLINICAL DATA:  Acute renal failure. History of hypertension, diabetes. EXAM: RENAL / URINARY TRACT ULTRASOUND COMPLETE COMPARISON:  None. FINDINGS: Right Kidney: Length: 12.7 cm. Echogenicity within normal limits. No mass or hydronephrosis visualized. Left Kidney: Length: 12.9 cm. Echogenicity within normal limits. No mass or hydronephrosis visualized. Bladder: Appears normal for degree of bladder distention. IMPRESSION:  Negative. Electronically Signed   By: Awilda Metro M.D.   On: 09/18/2015 06:20   Dg Chest Port 1 View  09/18/2015  CLINICAL DATA:  Acute onset of dizziness and shortness of breath. Initial encounter. EXAM: PORTABLE CHEST 1 VIEW COMPARISON:  None. FINDINGS: The lungs are well-aerated. Vascular congestion is noted. Increased interstitial markings raise concern for mild pulmonary edema. No pleural effusion or pneumothorax is seen. The cardiomediastinal silhouette is mildly enlarged. A pacemaker is noted overlying the left chest wall, with a single lead ending overlying the right ventricle. No acute osseous abnormalities are seen. IMPRESSION: Vascular congestion and mild cardiomegaly. Increased interstitial markings raise concern for mild pulmonary edema. Electronically Signed   By: Roanna Raider M.D.   On: 09/18/2015 03:12   Ct Head Code Stroke W/o Cm  10/16/2015  CLINICAL DATA:  Acute onset of altered mental status and headache. Confusion. Code stroke. Initial encounter. EXAM: CT HEAD WITHOUT CONTRAST TECHNIQUE: Contiguous axial images were obtained from the base of the skull through the vertex without intravenous contrast. COMPARISON:  None. FINDINGS: There is no evidence of acute infarction, mass lesion, or intra- or extra-axial hemorrhage on CT. Prominence of the ventricles and sulci reflects mild cortical  volume loss. Mild cerebellar atrophy is noted. A small chronic infarct is noted at the right occipital lobe. Small chronic lacunar infarcts are noted in the external capsule bilaterally. Scattered foci of decreased attenuation at the white matter of the convexity may reflect remote injury or infarct. The brainstem and fourth ventricle are within normal limits. The cerebral hemispheres demonstrate grossly normal gray-white differentiation. No mass effect or midline shift is seen. There is no evidence of fracture; visualized osseous structures are unremarkable in appearance. The orbits are within normal  limits. The paranasal sinuses and mastoid air cells are well-aerated. No significant soft tissue abnormalities are seen. IMPRESSION: 1. No acute intracranial pathology seen on CT. 2. 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 lacunar infarcts at the external capsule bilaterally. 3. Scattered foci of decreased attenuation at the white matter of the convexity may reflect remote injury or infarct. 4. Mild cortical volume loss noted. These results were called by telephone at the time of interpretation on 10/16/2015 at 1:51 am to Dr. Ritta SlotMCNEILL KIRKPATRICK, who verbally acknowledged these results. Electronically Signed   By: Roanna RaiderJeffery  Chang M.D.   On: 10/16/2015 01:56     Microbiology: No results found for this or any previous visit (from the past 240 hour(s)).   Labs: Basic Metabolic Panel:  Recent Labs Lab 10/16/15 0134 10/16/15 0141 10/17/15 0631  NA 137 140 141  K 4.3 4.3 4.3  CL 104 102 108  CO2 26  --  28  GLUCOSE 135* 130* 87  BUN 43* 43* 28*  CREATININE 1.59* 1.50* 1.13  CALCIUM 8.2*  --  8.5*  MG  --   --  2.2   Liver Function Tests:  Recent Labs Lab 10/16/15 0134  AST 18  ALT 16*  ALKPHOS 59  BILITOT 0.8  PROT 5.9*  ALBUMIN 3.7   No results for input(s): LIPASE, AMYLASE in the last 168 hours.  Recent Labs Lab 10/16/15 0809  AMMONIA 19   CBC:  Recent Labs Lab 10/16/15 0134 10/16/15 0141 10/17/15 0631  WBC 5.2  --  3.6*  NEUTROABS 3.6  --  2.5  HGB 11.5* 11.2* 10.7*  HCT 35.1* 33.0* 31.9*  MCV 94.1  --  93.0  PLT 128*  --  111*   Cardiac Enzymes: No results for input(s): CKTOTAL, CKMB, CKMBINDEX, TROPONINI in the last 168 hours. BNP: Invalid input(s): POCBNP CBG:  Recent Labs Lab 10/16/15 1204 10/16/15 1654 10/16/15 2207 10/17/15 0707 10/17/15 1138  GLUCAP 87 118* 100* 79 95    Time coordinating discharge:  Greater than 30 minutes  Signed:  Christiano Blandon, DO [facility] Pager: [phone] 10/17/2015, 4:30 PM

## 2015-10-17 NOTE — Progress Notes (Signed)
Discharge instructions reviewed with patient. All questions answered at this time. Transport home by sister.   Sim BoastHavy, RN

## 2015-10-17 NOTE — Progress Notes (Signed)
CPAP order received and acknowledged.  RT, Miriam, notified of order with request for setup for patient.

## 2015-10-17 NOTE — Progress Notes (Signed)
EEG completed; results pending.    

## 2015-10-21 ENCOUNTER — Ambulatory Visit: Payer: 59 | Admitting: Psychiatry

## 2015-10-26 ENCOUNTER — Encounter
Admission: RE | Admit: 2015-10-26 | Discharge: 2015-10-26 | Disposition: A | Discharge status: Home / Self Care | Payer: Medicare Other | Source: Ambulatory Visit | Attending: Specialist | Admitting: Specialist

## 2015-10-26 DIAGNOSIS — Z01818 Encounter for other preprocedural examination: Secondary | ICD-10-CM | POA: Diagnosis present

## 2015-10-26 HISTORY — DX: Angina pectoris, unspecified: I20.9

## 2015-10-26 HISTORY — DX: Major depressive disorder, single episode, unspecified: F32.9

## 2015-10-26 HISTORY — DX: Acute myocardial infarction, unspecified: I21.9

## 2015-10-26 HISTORY — DX: Lymphedema, not elsewhere classified: I89.0

## 2015-10-26 HISTORY — DX: Dehydration: E86.0

## 2015-10-26 HISTORY — DX: Restless legs syndrome: G25.81

## 2015-10-26 HISTORY — DX: Hypothyroidism, unspecified: E03.9

## 2015-10-26 HISTORY — DX: Gastro-esophageal reflux disease without esophagitis: K21.9

## 2015-10-26 HISTORY — DX: Depression, unspecified: F32.A

## 2015-10-26 HISTORY — DX: Gastrointestinal hemorrhage, unspecified: K92.2

## 2015-10-26 MED ORDER — CHLORHEXIDINE GLUCONATE CLOTH 2 % EX PADS
6.0000 | MEDICATED_PAD | Freq: Once | CUTANEOUS | Status: DC
  Filled 2015-10-26: qty 6

## 2015-10-26 NOTE — Pre-Procedure Instructions (Signed)
Ardyth Harps, MD - 05/31/2015 5:48 PM EST Formatting of this note may be different from the original. History of Present Illness:  The patient is a 70 y.o. White male who presents as a new patient for evaluation of Nonischemic Cardiomyopathy with Diastolic HFpEF, most likely secondary to HTN Obesity but doe have MI history also. Patient's most recent LVEF was 60-65% by Echocardiogram on 09/06/14.   He is referred by Dr. Farrell Ours at [facility] to [place] to locate him closer to home for follow-up in a CHF Clinic.   This patient has multiple risk factors for HFpEF. HTN and Morbid Obesity seems most likely etiology factors. He does have history of CAD and MI but no recent angina no systolic LVD.   He has longstanding history of marked volume overload despite good renal function. He has been managed well with aggressive diuresis and marked sodium restriction. He clearly has worked very hard to reduce his dietary sodium intake and is quite knowledgeable about which foods to avoid. He reports intake of sodium as low as 2000 mg per day.   He does report being volume up today. He reports 309 as a good weight for him. Today he is in congestive cycle and is up significantly from that good weight.   Recent Visits  Patient returned at his last visit for follow-up. He was definitely feeling better with less congestive symptopms.  Had been really working on sodium restriction. He had lost a good deal of weight and his congestive symptoms were better. We are thinking that around 304 pounds is a good weight for him.   One return today, he continues to be improved from his new patient visit but does seem more fluid up today compared to his last follow-up visit. He is somewhat more symptomatic as well.   Body weight today is 310.5 pounds up from 305.4 pounds at his last visit to this clinic for gain of 5.1 pounds. He remains well down from 330.8 pounds at his first visit. But is up from dry weight.  We have been  thinking that his dry weight is close to 304 pounds.   CHF Symptom Review  DOE Patient reports dyspnea with exertional activity, which is described as mild in degree.   Orthopnea 1 Pillows None PND 0 times None Edema Yes Feet still likely bit worse today Fatigue Yes - mild but worse than last visit Chest Pain No Typical AP No  Early Satiety Yes Dec Appetite Yes Seems stable today. Some of this persists due to his gastric surgery  Bloating Yes Dizziness No Presyncopy No Syncope No  Past Medical History:  Past Medical History  Diagnosis Date  . CHF (congestive heart failure) (RAF-HCC)  . Diabetes mellitus (RAF-HCC)  . History of transfusion  . Hypertension  . Stroke (RAF-HCC)  reports was c-spine ischemia due to compression  . Sleep apnea  . Hereditary lymphedema of legs  . A-fib (RAF-HCC)  . Coronary atherosclerosis 02/10/2010  . NSTEMI (non-ST elevated myocardial infarction) (RAF-HCC)  . OSA on CPAP  . Gastric polyp  antrum  . Syndactyly  fingers and toes  . Restless legs syndrome  . Heart murmur  . 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 Reflux    Wt Readings from Last 3 Encounters:  05/19/15 140.842 kg (310 lb 8 oz)  04/12/15 141.613 kg (312 lb 3.2 oz)  03/15/15 142.792 kg (314 lb 12.8 oz)   Temp Readings from Last 3 Encounters:  04/12/15 36.6  C (97.9 F) Oral  03/15/15 36.4 C (97.6 F) Oral  03/01/15 36.4 C (97.5 F) Oral   BP Readings from Last 3 Encounters:  05/19/15 106/58  04/12/15 106/59  03/15/15 131/62   Pulse Readings from Last 3 Encounters:  05/19/15 64  04/12/15 70  03/15/15 70   Family History: Family History  Problem Relation Age of Onset  . Hypertension Mother  . Asthma Mother  . Stroke Father  . Substance Abuse Disorder Sister  . Liver disease Daughter  hep C   Social History: Social History   Social History  . Marital Status: Married  Spouse Name: N/A  . Number of Children: N/A  . Years of Education:  N/A   Social History Main Topics  . Smoking status: Former Smoker -- 4.00 packs/day for 15 years  Quit date: 03/24/1979  . Smokeless tobacco: Never Used  . Alcohol Use: 2.4 oz/week  4 Cans of beer per week  . Drug Use: No  . Sexual Activity: Not Asked   Other Topics Concern  . None   Social History Narrative    Medications: Prior to Admission medications  Medication Sig Start Date End Date Taking? Authorizing Provider  ascorbic acid (VITAMIN C) 1000 MG tablet Take 1,000 mg by mouth daily. Yes Historical Provider, MD  atorvastatin (LIPITOR) 80 MG tablet Take 1 tablet (80 mg total) by mouth daily. 02/01/14 02/01/15 Yes Jarome MatinPatrick D Dale, MD  bumetanide (BUMEX) 1 MG tablet Take 3 tablets (3 mg total) by mouth Take as directed. Take 2 mg tablets in the morning and 1 mg tablet in afternoon 01/06/15 01/06/16 Yes Ashley MarinerShabina Sheikh, NP  cholecalciferol, vitamin D3, 2,000 unit cap Take 2,000 Units by mouth daily. Yes Historical Provider, MD  citalopram (CELEXA) 20 MG tablet Take 20 mg by mouth daily. Yes Historical Provider, MD  cyanocobalamin 1000 MCG tablet Take 1,000 mcg by mouth daily. 02/03/10 Yes Historical Provider, MD  ferrous sulfate 325 (65 FE) MG EC tablet Take 650 mg by mouth Two (2) times a day. Yes Historical Provider, MD  gabapentin (NEURONTIN) 400 MG capsule Take 400 mg by mouth Three (3) times a day as needed. Yes Historical Provider, MD  glimepiride (AMARYL) 2 MG tablet Take 2 mg by mouth every morning before breakfast. Yes Historical Provider, MD  isosorbide mononitrate (IMDUR) 60 MG 24 hr tablet Take 60 mg by mouth daily. Yes Historical Provider, MD  levothyroxine (SYNTHROID, LEVOTHROID) 25 MCG tablet Take 25 mcg by mouth daily at 0600. Yes Historical Provider, MD  lisinopril (PRINIVIL,ZESTRIL) 20 MG tablet Take 1 tablet (20 mg total) by mouth daily. 02/01/14 02/01/15 Yes Jarome MatinPatrick D Dale, MD  LORazepam (ATIVAN) 1 MG tablet Take 1 mg by mouth Two (2) times a day. Yes Historical  Provider, MD  nitroglycerin (NITROSTAT) 0.4 MG SL tablet Place 0.4 mg under the tongue every five (5) minutes as needed. For chest pain 01/27/13 Yes Historical Provider, MD  pantoprazole (PROTONIX) 40 MG tablet Take 40 mg by mouth daily. Yes Historical Provider, MD  potassium chloride (KLOR-CON) 10 MEQ CR tablet Take 10 mEq by mouth daily. Yes Historical Provider, MD  rOPINIRole (REQUIP) 2 MG tablet Take 2 mg by mouth Three (3) times a day. Yes Historical Provider, MD  spironolactone (ALDACTONE) 25 MG tablet Take 25 mg by mouth daily. Yes Historical Provider, MD   Medications reviewed and discussed with patient by pharmacist and other team members in clinic.  Patient denies missed doses of medications. Patient reports compliance with sodium  restriction that is routine. By his history he is very knowledgeable about Na content of food. Counts Na mg daily on frequent basis.  Non-CHF Review of Systems: negative  Physical Examination - GENERAL: Well Developed Well Nourished Obese No Acute Distress  - VITAL SIGNS: Supine heart rate 72, supine blood pressure 126/70, standing heart rate 72, standing blood pressure 120/70 - EYES: NONICTERIC  - CARDIOVASCULAR: normal rate and regular rhythm, S1 and S2 normal - JVD Yes 8-10 cm noted better but not resolved - Pulses: Strong - Capillary Refill: Sluggish - Edema: Yes - Edema Location: Ankle Pre-Tibial Knee Edema Degree: 1+ Does have chronic lymphedema as well - still think there is fluid there. Better but not resolved - Respiratory Effort: Regular  - RESPIRATORY:Normal - ABDOMEN: Soft Non Tender  - ABDOMEN: Hepatomegaly: No - ABDOMEN: HJR: Yes - ABDOMEN: Ascites: Yes Possible Mild - Extermities: Negative  - SKIN: Warm  - NEUROLOGICAL:Alert and oriented X3  - PSYCHOLOGICAL: Appropriate   Key Lab Results: pro-bnp 1300 today which is up from last previous available proBNP was 1040 which was down from last visit of 1420 improved from 1890. All his  values are still really up for HFpEF especially considering degree of obesity he has. In addition, today's recent increase in proBNP is consistent with fluid to give again.  SCr 1.12 down a bit from 1.17 and K 4.0 down from 4.4 both from our last clinic visit - stable at this visit  ASSESSMENT AND PLAN  Patient Active Problem List  Diagnosis  . Atrial fibrillation (RAF-HCC)  . Coronary atherosclerosis  . Iron deficiency anemia due to chronic blood loss  . Chronic diastolic heart failure (RAF-HCC)  . Type II diabetes mellitus (RAF-HCC)  . Hemorrhage of gastrointestinal tract  . Morbid obesity (RAF-HCC)  . Obstructive sleep apnea  . Red blood cell antibody positive   Problem #1 HFpEF - most likely secondary to HTN and morbid obesity - does have IHD as well. Seems clearly up a good bit in terms of his volume today.  Appears to have gone to great lengths to address his sodium intake. Still may not be fully consistent from day to day however as reflected by increased body weight and high proBNP despite morbid obesity.   At first visit, he is on relatively low dose of Bumex - will double. Hopefully he can find way to decrease his Na intake even further. Will see how he responds and see him sooner as needed.   Follow-up At his visit today he has experienced significant weight loss and is definitely better. Still seems to have fluid to give by exam and persistently high proBNP.  At this stage will continue diuretic Rx same as he is losing weight on this regimen. Reinforced again need to maintain aggressive sodium restriction. Will stay in close follow-up as we push to dry weight.   Today's Visit Overall he is still significantly better than at his first visit. But he has lost some ground in terms of his fluid status. We see this in symptoms and his proBNP. Think we need to be aggressive with him as he has potential to gain weight rapidly. Will increase his Bumex and observe course. He will work  on sodium restriction as well.   Problem #2 HTN - not a problem today  Problem #3 Atrial Fib - seems rate controlled well - will monitor  Problem #4 DM type II - will recheck his A1c  Problem #5 Morbid Obesity - will  continue to encourage caloric restriction. Is S/P Gastric Sleeve but this has not been effective  Problem #6 Hx of CAD and MI - Not an active issue for him.  Problem #7 OSA - this would be important consideration but he is using CPAP with good compliance. Will monitor this.  Problem #8 GI Bleed - apparent AVMs - will see what his Hgb is today. Continue No ASA or Oral Anticoag. Hgb has improved with fluid loss.   Patient education given on dietary sodium restriction. Patient will work on monitoring fluid status closely or maintaining a body weight log.  Return in about 3 months (around 08/16/2015) for Recheck. Patient will continue to monitor fluid status - we see how he responds to extra bumex. Will see sooner as needed.  Patient will call if heart failure symptoms worsen before next visit.  Dr. Devonne Doughty clinic note for patient visit on 05/19/2015.   Plan of Care Upcoming Encounters Upcoming Encounters  Date Type Specialty Providers Description  12/26/2015 Appointment Cardiology Pernell Dupre, Tanya Nones, MD  [address]  [id number] [facility]  [place], [place] [address]  [phone]

## 2015-10-26 NOTE — Patient Instructions (Signed)
  Your procedure is scheduled on: 10/31/15 Mon Report to Same Day Surgery 2nd floor medical mall To find out your arrival time please call [phone] between 1PM - 3PM on 10/28/15 Fri  Remember: Instructions that are not followed completely may result in serious medical risk, up to and including death, or upon the discretion of your surgeon and anesthesiologist your surgery may need to be rescheduled.    _x___ 1. Do not eat food or drink liquids after midnight. No gum chewing or hard candies.     __x__ 2. No Alcohol for 24 hours before or after surgery.   __x__3. No Smoking for 24 prior to surgery.   ____  4. Bring all medications with you on the day of surgery if instructed.    __x__ 5. Notify your doctor if there is any change in your medical condition     (cold, fever, infections).     Do not wear jewelry, make-up, hairpins, clips or nail polish.  Do not wear lotions, powders, or perfumes. You may wear deodorant.  Do not shave 48 hours prior to surgery. Men may shave face and neck.  Do not bring valuables to the hospital.    [facility] is not responsible for any belongings or valuables.               Contacts, dentures or bridgework may not be worn into surgery.  Leave your suitcase in the car. After surgery it may be brought to your room.  For patients admitted to the hospital, discharge time is determined by your treatment team.   Patients discharged the day of surgery will not be allowed to drive home.    Please read over the following fact sheets that you were given:   [facility] Preparing for Surgery and or MRSA Information   _x___ Take these medicines the morning of surgery with A SIP OF WATER:    1. atorvastatin (LIPITOR) 80 MG tablet  2.carvedilol (COREG) 3.125 MG tablet  3.isosorbide mononitrate (IMDUR) 60 MG 24 hr tablet  4.levothyroxine (SYNTHROID, LEVOTHROID) 25 MCG tablet  5.lisinopril (PRINIVIL,ZESTRIL) 20 MG tablet  6.pantoprazole (PROTONIX) 40 MG  tablet  7.sertraline (ZOLOFT) 50 MG tablet ____ Fleet Enema (as directed)   _x___ Use CHG Soap or sage wipes as directed on instruction sheet   ____ Use inhalers on the day of surgery and bring to hospital day of surgery  ____ Stop metformin 2 days prior to surgery    ____ Take 1/2 of usual insulin dose the night before surgery and none on the morning of           surgery.   ____ Stop aspirin or coumadin, or plavix  _x__ Stop Anti-inflammatories such as Advil, Aleve, Ibuprofen, Motrin, Naproxen,          Naprosyn, Goodies powders or aspirin products. Ok to take Tylenol.   ____ Stop supplements until after surgery.    _x___ Bring C-Pap to the hospital.

## 2015-10-26 NOTE — Pre-Procedure Instructions (Signed)
Called Sherry at [facility] to verify they had obtained a clearance.  Left message.

## 2015-10-26 NOTE — Pre-Procedure Instructions (Signed)
Cardiac clearance on chart. 

## 2015-10-28 ENCOUNTER — Ambulatory Visit: Payer: 59 | Admitting: Psychiatry

## 2015-10-31 ENCOUNTER — Ambulatory Visit: Payer: 59 | Admitting: Licensed Clinical Social Worker

## 2015-10-31 ENCOUNTER — Ambulatory Visit: Admission: RE | Admit: 2015-10-31 | Payer: Medicare Other | Source: Ambulatory Visit | Admitting: Specialist

## 2015-10-31 ENCOUNTER — Encounter: Admission: RE | Payer: Self-pay | Source: Ambulatory Visit

## 2015-10-31 SURGERY — EXCISION, GANGLION CYST, WRIST
Anesthesia: Choice | Laterality: Right

## 2015-10-31 NOTE — Progress Notes (Signed)
   THERAPIST PROGRESS NOTE  Session Time:  Participation Level: Active  Behavioral Response: CasualAlertAnxious  Type of Therapy: Individual Therapy  Treatment Goals addressed: Coping  Interventions: CBT, Motivational Interviewing, Solution Focused, Strength-based, Supportive, Family Systems and Reframing  Summary: Jesse [name] is a 70 y.o. male who presents with continued symptoms of his diagnosis.  He denies using alcohol.  He reports that he has no urges to use alcohol.  He denies gambling.  He denies having urges to gamble.  He reports seeing a smile on his wife's face and visiting with his granddaughter helps him cope.  He reports that he gives all financial control to his wife. 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  Discussion of CD IOP in [place]. Review of his goals and plans on achieving them.  Discussion of his symptoms. On a scale from 1-10.  He reports his symptoms are 4.  Discussion of how to reduce symptoms.     Suicidal/Homicidal: Nowithout intent/plan  Therapist Response: LCSW provided Patient with ongoing emotional support and encouragement.  Normalized feelings.  Commended Patient on his progress and reinforced the importance of client staying focused on his own strengths and resources and resiliency. Processed various strategies for dealing with stressors.    Plan: Return again in 1 weeks.  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

## 2015-11-04 ENCOUNTER — Other Ambulatory Visit: Payer: Self-pay | Admitting: Psychiatry

## 2015-11-11 ENCOUNTER — Ambulatory Visit (INDEPENDENT_AMBULATORY_CARE_PROVIDER_SITE_OTHER): Payer: 59 | Admitting: Psychiatry

## 2015-11-11 DIAGNOSIS — F322 Major depressive disorder, single episode, severe without psychotic features: Secondary | ICD-10-CM

## 2015-11-11 DIAGNOSIS — F630 Pathological gambling: Secondary | ICD-10-CM

## 2015-11-11 MED ORDER — SERTRALINE HCL 50 MG PO TABS: 50.0000 mg | ORAL_TABLET | Freq: Every day | ORAL | 1 refills | Status: DC

## 2015-11-11 MED ORDER — TRAZODONE HCL 100 MG PO TABS: 100.0000 mg | ORAL_TABLET | Freq: Every evening | ORAL | 1 refills | Status: DC | PRN

## 2015-11-11 MED ORDER — NALTREXONE HCL 50 MG PO TABS: 50.0000 mg | ORAL_TABLET | Freq: Every day | ORAL | 1 refills | Status: DC

## 2015-11-11 NOTE — Progress Notes (Signed)
Psychiatric MD Progress NOTE  Patient Identification: Jesse [name] MRN:  [id number] Date of Evaluation:  11/11/2015 Referral Source: Inpt discharge  Chief Complaint:    Visit Diagnosis:    ICD-9-CM ICD-10-CM   1. Severe major depression, single episode, without psychotic features (HCC) 296.23 F32.2   2. 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 He reported that he missed his last appointment as he went out of town and was in [place]. He reported that he is out of his medications. He was recently admitted to the medical floor due to his cardiac condition. He was noticed to have severe lymphedema and his both legs. He reported that he is currently in the investigational study at [facility] and has been following with his cardiologist Dr. Pernell Dupre on a regular basis. He is compliant with his medications. He is taking Bumex and it is helping. Patient stated that he is feeling more calm since he has been out and has discussed with his wife about his gambling. 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 He appeared more calm and alert during the interview. He reported that he is compliant with his medications. He wants to restart taking the medications again.    Patient currently denied having any suicidal homicidal ideations or plans.  He is minimizing his gambling addiction at this time. He reported that he does not have any money and his wife has the control of all his money at this time.     Associated Signs/Symptoms: Depression Symptoms:  depressed mood, psychomotor retardation, fatigue, feelings of worthlessness/guilt, difficulty concentrating, anxiety, disturbed sleep, (Hypo) Manic Symptoms:  none Anxiety Symptoms:  none Psychotic Symptoms:  none PTSD Symptoms: Negative NA  Past Psychiatric History:  History of previous suicide attempt in April by overdose on clonazepam. Recently  attempted suicide by overdose on antidepressant medication. He has never seen a psychiatrist in the past. Long history of gambling  and alcohol use   Previous Psychotropic Medications:  celexa Lorazepam   Substance Abuse History in the last 12 months:  Yes.   Drinks alcohol on a regular basis  Consequences of Substance Abuse: Negative NA  Past Medical History:  Past Medical History:  Diagnosis Date  . A-fib (HCC)   . Anginal pain (HCC)   . CAD (coronary artery disease)   . CHF (congestive heart failure) (HCC)   . Chronic edema   . Dehydration   . Depression   . Diabetes mellitus without complication (HCC)   . GERD (gastroesophageal reflux disease)   . GI bleed   . Hypertension   . Hypothyroidism   . Lymphedema   . Myocardial infarction (HCC)    x2  . Restless leg syndrome   . Sleep apnea     Past Surgical History:  Procedure Laterality Date  . CARDIAC CATHETERIZATION Right 07/25/2015   Procedure: Left Heart Cath and Coronary Angiography;  Surgeon: Laurier Nancy, MD;  Location: [facility] INVASIVE CV LAB;  Service: Cardiovascular;  Laterality: Right;  . CARPAL TUNNEL RELEASE    . COSMETIC SURGERY     hands and feet  . GASTRIC BYPASS    . PACEMAKER INSERTION      Family Psychiatric History:  Sister was a heroine addict, now deceased. She committed suicide at age 63.   Family History:  Family History  Problem Relation Age of Onset  . Diabetes Brother   .  Alcohol abuse Father   . Drug abuse Sister     Social History:   Social History   Social History  . Marital status: Married    Spouse name: N/A  . Number of children: N/A  . Years of education: N/A   Occupational History  . retired    Social History Main Topics  . Smoking status: Former Smoker    Types: Cigarettes    Quit date: 04/09/1978  . Smokeless tobacco: Never Used  . Alcohol use No     Comment: quit 4 months ago  . Drug use: No  . Sexual activity: Not Currently   Other Topics Concern  . Not  on file   Social History Narrative  . No narrative on file    Additional Social History:  Married x 2. Currently married x 42 years.  Has 2 daughters.  Just moved from [place] to . Still has a house in [place].   Allergies:   Allergies  Allergen Reactions  . Other Hives    baskin robbins cake (preservatives!!)  . Sulfa Antibiotics Other (See Comments)    hallucinations   . Sulfacetamide Sodium     Hallucinations  . Zolpidem Other (See Comments)    Hallucinations; "makes me act stupid" per patient  . Latex Rash  . Tape Rash    Metabolic Disorder Labs: Lab Results  Component Value Date   HGBA1C 5.4 10/01/2015   Lab Results  Component Value Date   PROLACTIN 15.6 (H) 10/01/2015   Lab Results  Component Value Date   CHOL 98 09/29/2015   TRIG 153 (H) 09/29/2015   HDL 40 (L) 09/29/2015   CHOLHDL 2.5 09/29/2015   VLDL 31 09/29/2015   LDLCALC 27 09/29/2015     Current Medications: Current Outpatient Prescriptions  Medication Sig Dispense Refill  . acetaminophen (TYLENOL) 500 MG tablet Take 500-1,000 mg by mouth every 6 (six) hours as needed for headache.    . Ascorbic Acid (VITAMIN C) 1000 MG tablet Take 1,000 mg by mouth daily.    Marland Kitchen atorvastatin (LIPITOR) 80 MG tablet Take 80 mg by mouth daily.    . bumetanide (BUMEX) 1 MG tablet Take 1 tablet (1 mg total) by mouth at bedtime. (Patient taking differently: Take 1-2 mg by mouth daily. 2 mg in the morning and 1 mg at bedtime)    . carvedilol (COREG) 3.125 MG tablet Take 3.125 mg by mouth 2 (two) times daily.    . Cholecalciferol (VITAMIN D3) 2000 units TABS Take 2,000 Units by mouth daily.    . clopidogrel (PLAVIX) 75 MG tablet Take 1 tablet (75 mg total) by mouth daily. 30 tablet 6  . ferrous sulfate 325 (65 FE) MG tablet Take 650 mg by mouth 2 (two) times daily.     Marland Kitchen gabapentin (NEURONTIN) 400 MG capsule Take 2 capsules by mouth at bedtime.   0  . glimepiride (AMARYL) 2 MG tablet Take 2 mg by mouth  every morning.    . Investigational - Study Medication Valsartan/Placebo 160 mg- Take 1 tablet by mouth two times daily ([facility] 2-yr study)    . Investigational - Study Medication LCZ/696-Placebo 200 mg: Take 1 tablet by mouth two times daily ([facility] 2-yr study)    . isosorbide mononitrate (IMDUR) 60 MG 24 hr tablet Take 60 mg by mouth daily.    Marland Kitchen levothyroxine (SYNTHROID, LEVOTHROID) 25 MCG tablet Take 25 mcg by mouth daily.  0  . lisinopril (PRINIVIL,ZESTRIL) 20 MG tablet Take  1 tablet (20 mg total) by mouth every morning. 30 tablet 0  . naltrexone (DEPADE) 50 MG tablet Take 1 tablet (50 mg total) by mouth daily. 30 tablet 1  . nitroGLYCERIN (NITROSTAT) 0.4 MG SL tablet Place 0.4 mg under the tongue every 5 (five) minutes as needed for chest pain.    . pantoprazole (PROTONIX) 40 MG tablet Take 40 mg by mouth daily.  0  . potassium chloride SA (K-DUR,KLOR-CON) 20 MEQ tablet Take 1 tablet (20 mEq total) by mouth daily.    Marland Kitchen rOPINIRole (REQUIP) 2 MG tablet Take 4 mg by mouth at bedtime.   0  . sertraline (ZOLOFT) 50 MG tablet Take 1 tablet (50 mg total) by mouth daily. 30 tablet 1  . spironolactone (ALDACTONE) 25 MG tablet Take 25 mg by mouth daily.  0  . traZODone (DESYREL) 100 MG tablet Take 1 tablet (100 mg total) by mouth at bedtime as needed for sleep. 30 tablet 1  . vitamin B-12 (CYANOCOBALAMIN) 1000 MCG tablet Take 1,000 mcg by mouth daily.     No current facility-administered medications for this visit.     Neurologic: Headache: No Seizure: No Paresthesias:No  Musculoskeletal: Strength & Muscle Tone: decreased Gait & Station: normal Patient leans: N/A  Psychiatric Specialty Exam: Review of Systems  Cardiovascular: Positive for chest pain and leg swelling.  Psychiatric/Behavioral: Positive for depression. The patient is nervous/anxious.   All other systems reviewed and are negative.   There were no vitals taken for this visit.There is no height or weight on  file to calculate BMI.  General Appearance: Casual  Eye Contact:  Fair  Speech:  Clear and Coherent and Normal Rate  Volume:  Decreased  Mood:  Anxious and Depressed  Affect:  Appropriate  Thought Process:  Coherent and Goal Directed  Orientation:  Full (Time, Place, and Person)  Thought Content:  Logical  Suicidal Thoughts:  No  Homicidal Thoughts:  No  Memory:  Immediate;   Fair Recent;   Fair Remote;   Fair  Judgement:  Fair  Insight:  Fair  Psychomotor Activity:  Normal  Concentration:  Concentration: Fair and Attention Span: Fair  Recall:  [employer] of Knowledge:Fair  Language: Fair  Akathisia:  No  Handed:  Right  AIMS (if indicated):    Assets:  Communication Skills Desire for Improvement Physical Health Social Support  ADL's:  Intact  Cognition: WNL  Sleep:      Treatment Plan Summary: Medication management   Discussed with the patient and his wife at length about the medications treatment risks benefits and alternatives Continue  Zoloft 50 mg daily Continue trazodone 100 mg by mouth daily at bedtime when necessary for insomnia Continue naltrexone 50 mg  as prescribed He will continue with the therapy on a regular basis Follow-up in 2 months  or earlier depending on his symptoms Reviewed his records from [facility]   More than 50% of the time spent in psychoeducation, counseling and coordination of care.    This note was generated in part or whole with voice recognition software. Voice regonition is usually quite accurate but there are transcription errors that can and very often do occur. I apologize for any typographical errors that were not detected and corrected.    Brandy Hale, MD 8/4/20179:18 AM

## 2015-11-14 ENCOUNTER — Ambulatory Visit: Payer: 59 | Admitting: Licensed Clinical Social Worker

## 2015-11-30 ENCOUNTER — Other Ambulatory Visit: Payer: Self-pay | Admitting: Psychiatry

## 2016-01-11 ENCOUNTER — Ambulatory Visit (INDEPENDENT_AMBULATORY_CARE_PROVIDER_SITE_OTHER): Payer: 59 | Admitting: Psychiatry

## 2016-01-11 ENCOUNTER — Encounter: Payer: Self-pay | Admitting: Psychiatry

## 2016-01-11 VITALS — BP 125/74 | HR 70 | Temp 97.5°F | Wt 294.8 lb

## 2016-01-11 DIAGNOSIS — F331 Major depressive disorder, recurrent, moderate: Secondary | ICD-10-CM | POA: Diagnosis not present

## 2016-01-11 DIAGNOSIS — F630 Pathological gambling: Secondary | ICD-10-CM

## 2016-01-11 DIAGNOSIS — F1020 Alcohol dependence, uncomplicated: Secondary | ICD-10-CM

## 2016-01-11 MED ORDER — SERTRALINE HCL 50 MG PO TABS
50.0000 mg | ORAL_TABLET | Freq: Every day | ORAL | 1 refills | Status: DC
Start: 2016-01-11 — End: 2016-01-11

## 2016-01-11 MED ORDER — TRAZODONE HCL 100 MG PO TABS: 100.0000 mg | ORAL_TABLET | Freq: Every day | ORAL | 1 refills | Status: DC

## 2016-01-11 MED ORDER — SERTRALINE HCL 50 MG PO TABS
50.0000 mg | ORAL_TABLET | Freq: Every day | ORAL | 4 refills | Status: DC
Start: 2016-01-11 — End: 2016-01-11

## 2016-01-11 MED ORDER — NALTREXONE HCL 50 MG PO TABS: 50.0000 mg | ORAL_TABLET | Freq: Every day | ORAL | 4 refills | Status: AC

## 2016-01-11 MED ORDER — SERTRALINE HCL 50 MG PO TABS: 50.0000 mg | ORAL_TABLET | Freq: Every day | ORAL | 4 refills | Status: AC

## 2016-01-11 MED ORDER — NALTREXONE HCL 50 MG PO TABS: 50.0000 mg | ORAL_TABLET | Freq: Every day | ORAL | 4 refills | Status: DC

## 2016-01-11 MED ORDER — NALTREXONE HCL 50 MG PO TABS: 50.0000 mg | ORAL_TABLET | Freq: Every day | ORAL | 1 refills | Status: DC

## 2016-01-11 MED ORDER — TRAZODONE HCL 100 MG PO TABS: 100.0000 mg | ORAL_TABLET | Freq: Every evening | ORAL | 4 refills | Status: AC | PRN

## 2016-01-11 MED ORDER — TRAZODONE HCL 100 MG PO TABS: 100.0000 mg | ORAL_TABLET | Freq: Every evening | ORAL | 4 refills | Status: DC | PRN

## 2016-01-11 NOTE — Progress Notes (Signed)
Psychiatric MD Progress NOTE  Patient Identification: Jesse [name] MRN:  [id number] Date of Evaluation:  01/11/2016 Referral Source: Inpt discharge  Chief Complaint:   Chief Complaint    Follow-up; Medication Refill     Visit Diagnosis:    ICD-9-CM ICD-10-CM   1. MDD (major depressive disorder), recurrent episode, moderate (HCC) 296.32 F33.1   2. 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 Uncomplicated alcohol dependence (HCC) 303.90 F10.20     History of Present Illness:   Patient is a 70 year old married male who presented for follow-up. He reported that he Has been doing well and has been taking his medications as prescribed. Patient brought a bottle of naltrexone and reported that he wants a refill. Patient is still in the investigational study at [facility]. He has been following with his cardiologist Dr. Pernell Dupre on a regular basis. He is compliant with his medications. He is taking Bumex and it is helping. Patient stated that he is feeling more calm and has severe lymphedema in his legs.   He was discussing about his home and reported that he is moving out of his current house in [place] as he is having issues with the homeowners association. He will be moving to [place] on October 12. His home is already packed up in the [place]OD[place] and he is waiting to move soon. Patient reported that the medications are helping him. He currently denied having any suicidal ideations or plans. He is denying having any perceptual disturbances. He reported that he is compliant with his medications.  .     Associated Signs/Symptoms: Depression Symptoms:  depressed mood, psychomotor retardation, fatigue, feelings of worthlessness/guilt, difficulty concentrating, anxiety, disturbed sleep, (Hypo) Manic Symptoms:  none Anxiety Symptoms:  none Psychotic Symptoms:  none PTSD Symptoms: Negative NA  Past Psychiatric History:  History of previous suicide attempt in April by overdose on clonazepam. Recently  attempted suicide by overdose on antidepressant medication. He has never seen a psychiatrist in the past. Long history of gambling  and alcohol use   Previous Psychotropic Medications:  celexa Lorazepam   Substance Abuse History in the last 12 months:  Yes.   Drinks alcohol on a regular basis  Consequences of Substance Abuse: Negative NA  Past Medical History:  Past Medical History:  Diagnosis Date  . A-fib (HCC)   . Anginal pain (HCC)   . CAD (coronary artery disease)   . CHF (congestive heart failure) (HCC)   . Chronic edema   . Dehydration   . Depression   . Diabetes mellitus without complication (HCC)   . GERD (gastroesophageal reflux disease)   . GI bleed   . Hypertension   . Hypothyroidism   . Lymphedema   . Myocardial infarction    x2  . Restless leg syndrome   . Sleep apnea     Past Surgical History:  Procedure Laterality Date  . CARDIAC CATHETERIZATION Right 07/25/2015   Procedure: Left Heart Cath and Coronary Angiography;  Surgeon: Laurier Nancy, MD;  Location: [facility] INVASIVE CV LAB;  Service: Cardiovascular;  Laterality: Right;  . CARPAL TUNNEL RELEASE    . COSMETIC SURGERY     hands and feet  . GASTRIC BYPASS    . PACEMAKER INSERTION      Family Psychiatric History:  Sister was a heroine addict, now deceased. She committed suicide at age 35.   Family History:  Family History  Problem Relation Age of Onset  . Diabetes Brother   . Alcohol abuse Father   .  Drug abuse Sister     Social History:   Social History   Social History  . Marital status: Married    Spouse name: N/A  . Number of children: N/A  . Years of education: N/A   Occupational History  . retired    Social History Main Topics  . Smoking status: Former Smoker    Types: Cigarettes    Quit date: 04/09/1978  . Smokeless tobacco: Never Used  . Alcohol use No     Comment: quit 4 months ago  . Drug use: No  . Sexual activity: Not Currently   Other Topics Concern  . None    Social History Narrative  . None    Additional Social History:  Married x 2. Currently married x 42 years.  Has 2 daughters.  Just moved from [place] to [place]. Still has a house in [place].   Allergies:   Allergies  Allergen Reactions  . Other Hives    baskin robbins cake (preservatives!!)  . Sulfa Antibiotics Other (See Comments)    hallucinations   . Sulfacetamide Sodium     Hallucinations  . Zolpidem Other (See Comments)    Hallucinations; "makes me act stupid" per patient  . Latex Rash  . Tape Rash    Metabolic Disorder Labs: Lab Results  Component Value Date   HGBA1C 5.4 10/01/2015   Lab Results  Component Value Date   PROLACTIN 15.6 (H) 10/01/2015   Lab Results  Component Value Date   CHOL 98 09/29/2015   TRIG 153 (H) 09/29/2015   HDL 40 (L) 09/29/2015   CHOLHDL 2.5 09/29/2015   VLDL 31 09/29/2015   LDLCALC 27 09/29/2015     Current Medications: Current Outpatient Prescriptions  Medication Sig Dispense Refill  . acetaminophen (TYLENOL) 500 MG tablet Take 500-1,000 mg by mouth every 6 (six) hours as needed for headache.    . Ascorbic Acid (VITAMIN C) 1000 MG tablet Take 1,000 mg by mouth daily.    Marland Kitchen atorvastatin (LIPITOR) 80 MG tablet Take 80 mg by mouth daily.    . bumetanide (BUMEX) 1 MG tablet Take 1 tablet (1 mg total) by mouth at bedtime. (Patient taking differently: Take 1-2 mg by mouth daily. 2 mg in the morning and 1 mg at bedtime)    . carvedilol (COREG) 3.125 MG tablet Take 3.125 mg by mouth 2 (two) times daily.    . Cholecalciferol (VITAMIN D3) 2000 units TABS Take 2,000 Units by mouth daily.    . clopidogrel (PLAVIX) 75 MG tablet Take 1 tablet (75 mg total) by mouth daily. 30 tablet 6  . ferrous sulfate 325 (65 FE) MG tablet Take 650 mg by mouth 2 (two) times daily.     Marland Kitchen gabapentin (NEURONTIN) 400 MG capsule Take 2 capsules by mouth at bedtime.   0  . glimepiride (AMARYL) 2 MG tablet Take 2 mg by mouth every morning.    .  Investigational - Study Medication Valsartan/Placebo 160 mg- Take 1 tablet by mouth two times daily ([facility] 2-yr study)    . Investigational - Study Medication LCZ/696-Placebo 200 mg: Take 1 tablet by mouth two times daily ([facility] 2-yr study)    . isosorbide mononitrate (IMDUR) 60 MG 24 hr tablet Take 60 mg by mouth daily.    Marland Kitchen levothyroxine (SYNTHROID, LEVOTHROID) 25 MCG tablet Take 25 mcg by mouth daily.  0  . lisinopril (PRINIVIL,ZESTRIL) 20 MG tablet Take 1 tablet (20 mg total) by mouth every morning. 30 tablet  0  . naltrexone (DEPADE) 50 MG tablet Take 1 tablet (50 mg total) by mouth daily. 90 tablet 4  . nitroGLYCERIN (NITROSTAT) 0.4 MG SL tablet Place 0.4 mg under the tongue every 5 (five) minutes as needed for chest pain.    . pantoprazole (PROTONIX) 40 MG tablet Take 40 mg by mouth daily.  0  . potassium chloride SA (K-DUR,KLOR-CON) 20 MEQ tablet Take 1 tablet (20 mEq total) by mouth daily.    Marland Kitchen. rOPINIRole (REQUIP) 2 MG tablet Take 4 mg by mouth at bedtime.   0  . sertraline (ZOLOFT) 50 MG tablet Take 1 tablet (50 mg total) by mouth daily. 90 tablet 4  . spironolactone (ALDACTONE) 25 MG tablet Take 25 mg by mouth daily.  0  . traZODone (DESYREL) 100 MG tablet Take 1 tablet (100 mg total) by mouth at bedtime as needed for sleep. 90 tablet 4  . vitamin B-12 (CYANOCOBALAMIN) 1000 MCG tablet Take 1,000 mcg by mouth daily.     No current facility-administered medications for this visit.     Neurologic: Headache: No Seizure: No Paresthesias:No  Musculoskeletal: Strength & Muscle Tone: decreased Gait & Station: normal Patient leans: N/A  Psychiatric Specialty Exam: Review of Systems  Cardiovascular: Positive for chest pain and leg swelling.  Psychiatric/Behavioral: Positive for depression. The patient is nervous/anxious.   All other systems reviewed and are negative.   Blood pressure 125/74, pulse 70, temperature 97.5 F (36.4 C), temperature source Oral, weight  294 lb 12.8 oz (133.7 kg).Body mass index is 38.89 kg/m.  General Appearance: Casual  Eye Contact:  Fair  Speech:  Clear and Coherent and Normal Rate  Volume:  Decreased  Mood:  Anxious and Depressed  Affect:  Appropriate  Thought Process:  Coherent and Goal Directed  Orientation:  Full (Time, Place, and Person)  Thought Content:  Logical  Suicidal Thoughts:  No  Homicidal Thoughts:  No  Memory:  Immediate;   Fair Recent;   Fair Remote;   Fair  Judgement:  Fair  Insight:  Fair  Psychomotor Activity:  Normal  Concentration:  Concentration: Fair and Attention Span: Fair  Recall:  [employer]  Fund of Knowledge:Fair  Language: Fair  Akathisia:  No  Handed:  Right  AIMS (if indicated):    Assets:  Communication Skills Desire for Improvement Physical Health Social Support  ADL's:  Intact  Cognition: WNL  Sleep:      Treatment Plan Summary: Medication management   Discussed with the patient and his wife at length about the medications treatment risks benefits and alternatives Continue  Zoloft 50 mg daily Continue trazodone 100 mg by mouth daily at bedtime when necessary for insomnia Continue naltrexone 50 mg  as prescribed  Follow-up in 4 months  or earlier depending on his symptoms    More than 50% of the time spent in psychoeducation, counseling and coordination of care.    This note was generated in part or whole with voice recognition software. Voice regonition is usually quite accurate but there are transcription errors that can and very often do occur. I apologize for any typographical errors that were not detected and corrected.    Brandy HaleUzma Thales Knipple, MD 10/4/20179:05 AM

## 2016-02-08 DEATH — deceased

## 2016-04-25 ENCOUNTER — Ambulatory Visit: Payer: 59 | Admitting: Psychiatry

## 2017-04-19 IMAGING — US US RENAL
1 series · 14 of 25 positions shown · non-contrast
Comparison: None.

CLINICAL DATA: Acute renal failure. History of hypertension,
diabetes.

EXAM:
RENAL / URINARY TRACT ULTRASOUND COMPLETE

[Series 1: us renal · 0.26mm/px · 14 of 30 slices shown]
[im 1/30]
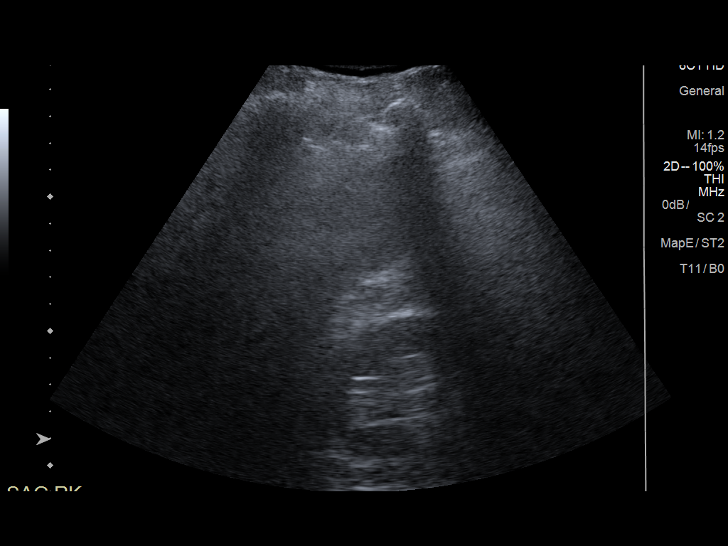
[im 3/30]
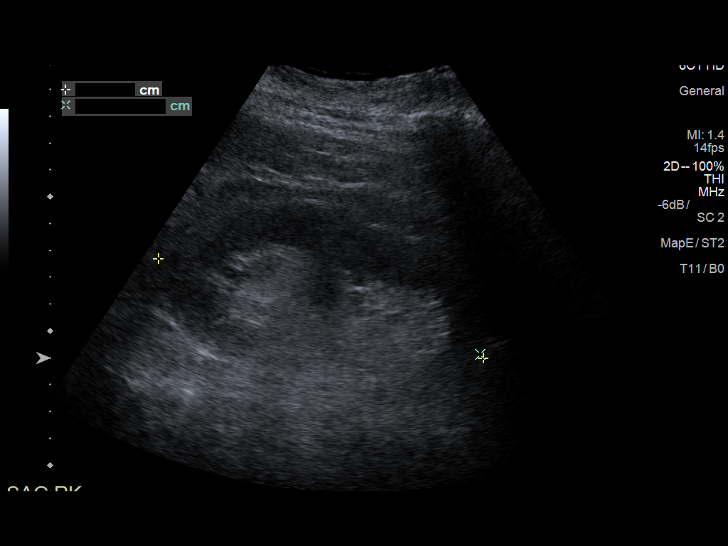
[im 5/30]
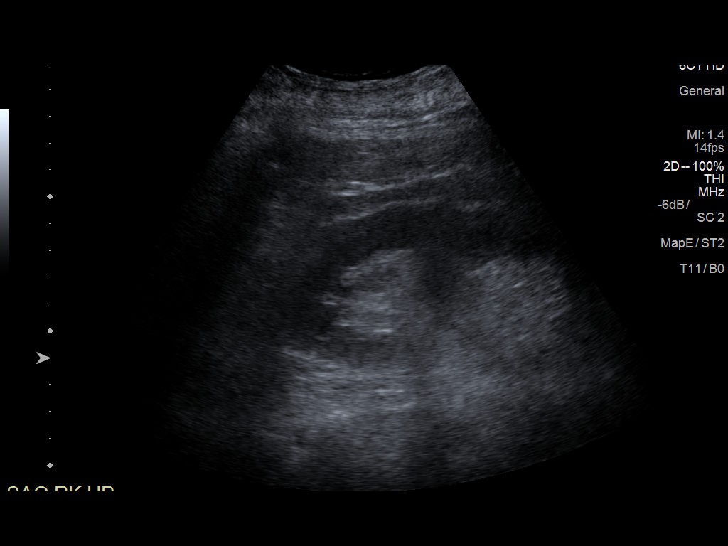
[im 8/30]
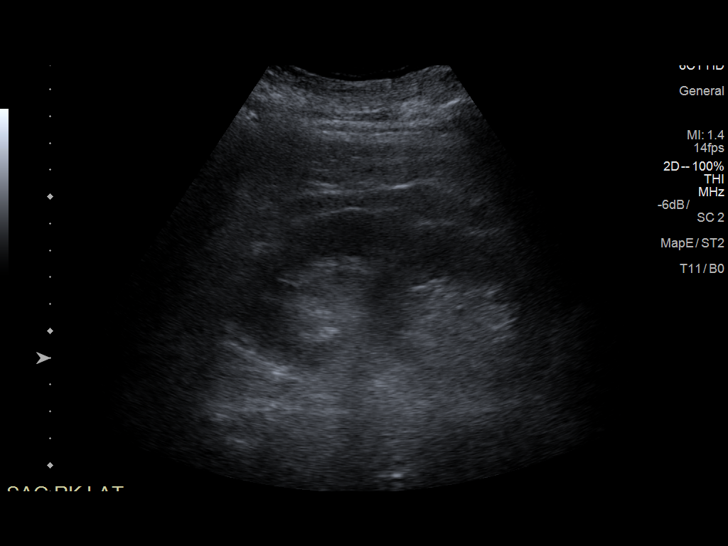
[im 10/30]
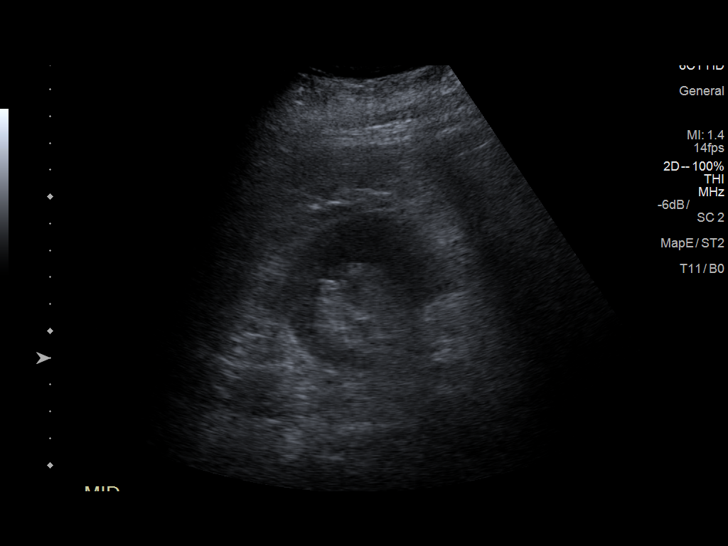
[im 11/30]
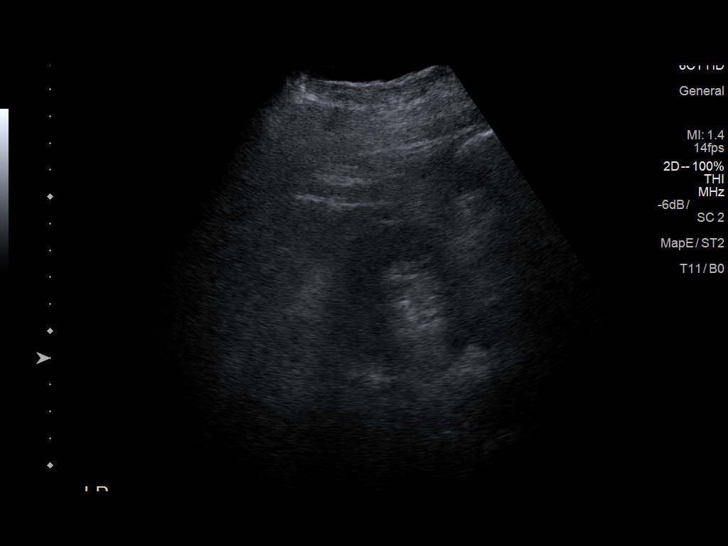
[im 14/30]
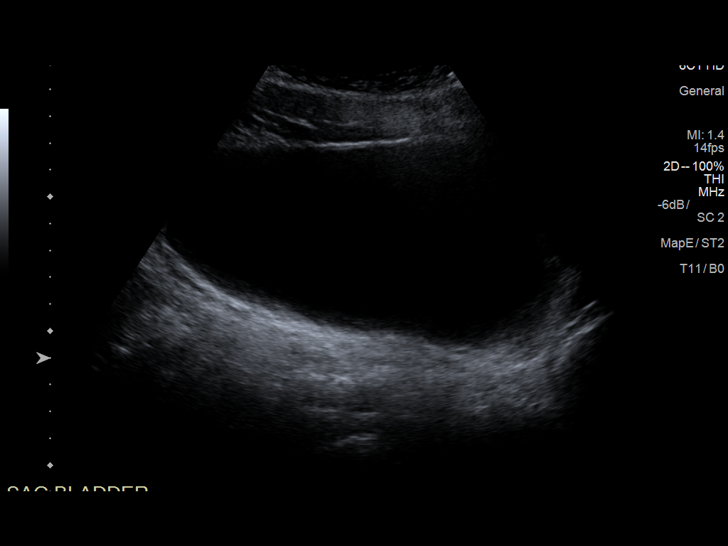
[im 16/30]
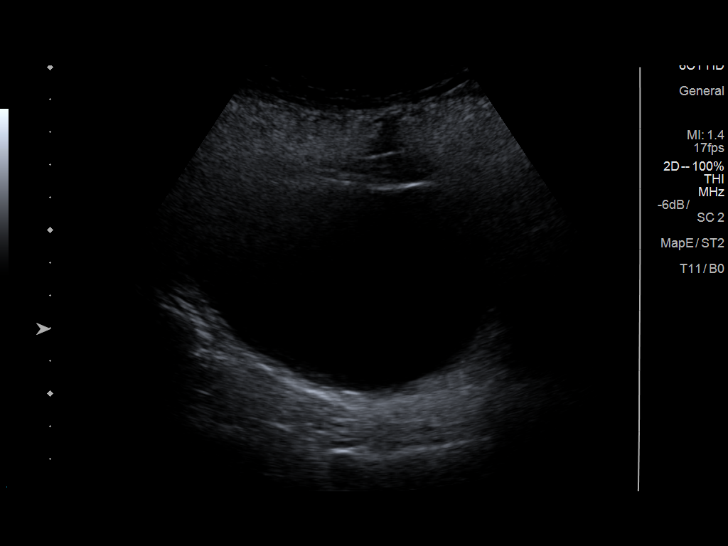
[im 19/30]
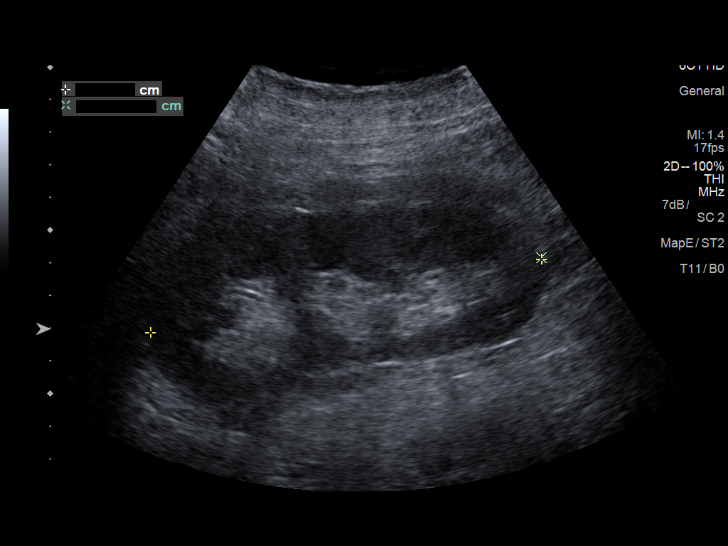
[im 20/30]
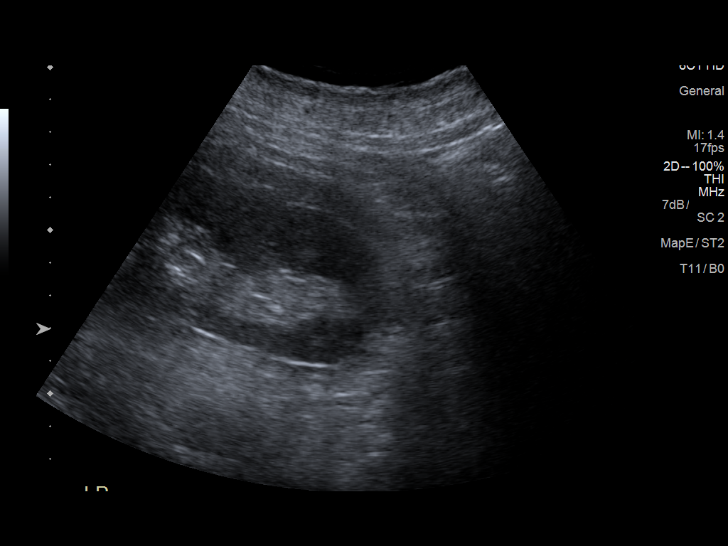
[im 22/30]
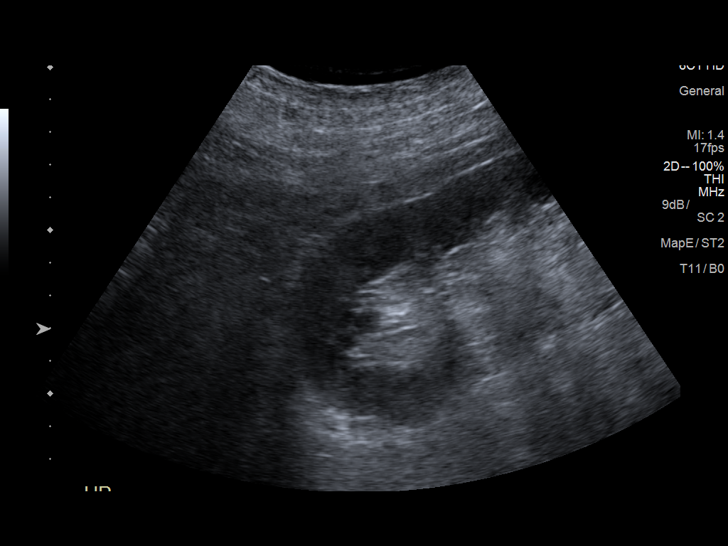
[im 25/30]
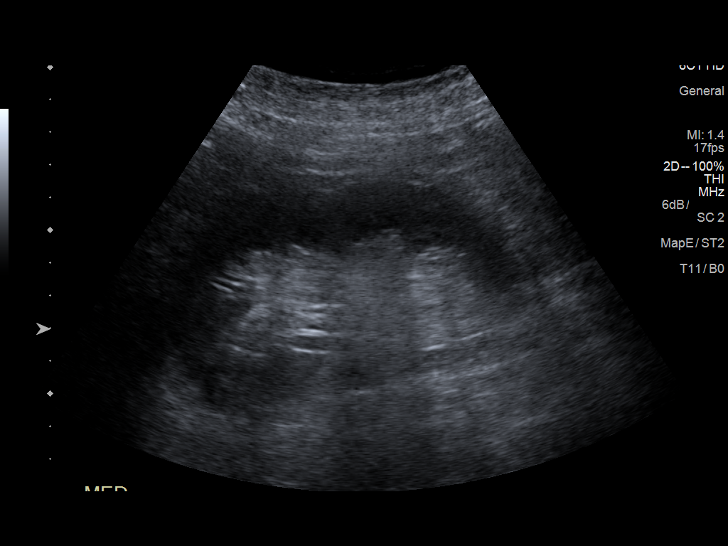
[im 27/30]
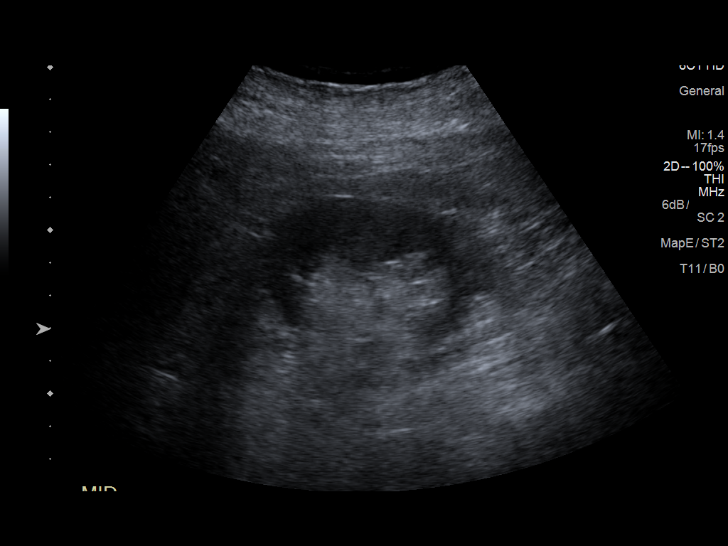
[im 30/30]
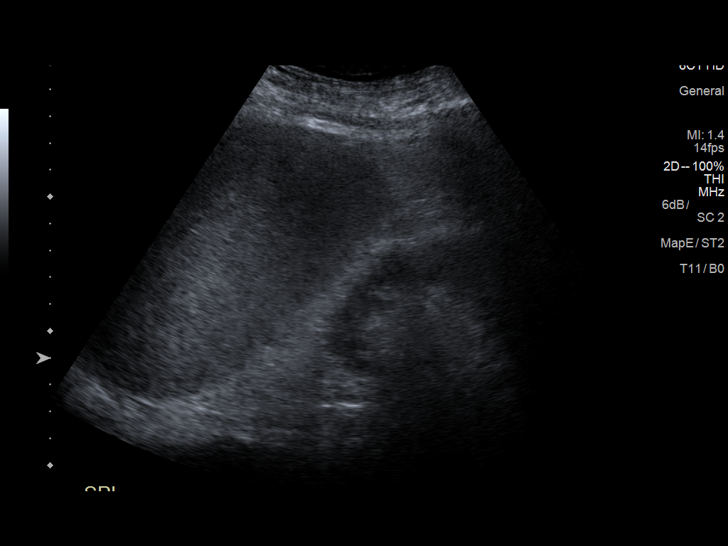

[14 of 25 positions shown; findings below may reference images not displayed]

FINDINGS: Right Kidney:

Length: 12.7 cm. Echogenicity within normal limits. No mass or
hydronephrosis visualized.

Left Kidney:

Length: 12.9 cm. Echogenicity within normal limits. No mass or
hydronephrosis visualized.

Bladder:

Appears normal for degree of bladder distention.
IMPRESSION: Negative.

## 2017-07-08 IMAGING — CR DG CHEST 1V PORT
2 series · 2 of 2 positions shown · non-contrast
Comparison: None.

CLINICAL DATA: Acute onset of dizziness and shortness of breath.
Initial encounter.

EXAM:
PORTABLE CHEST 1 VIEW

[AP (1 of 2)]
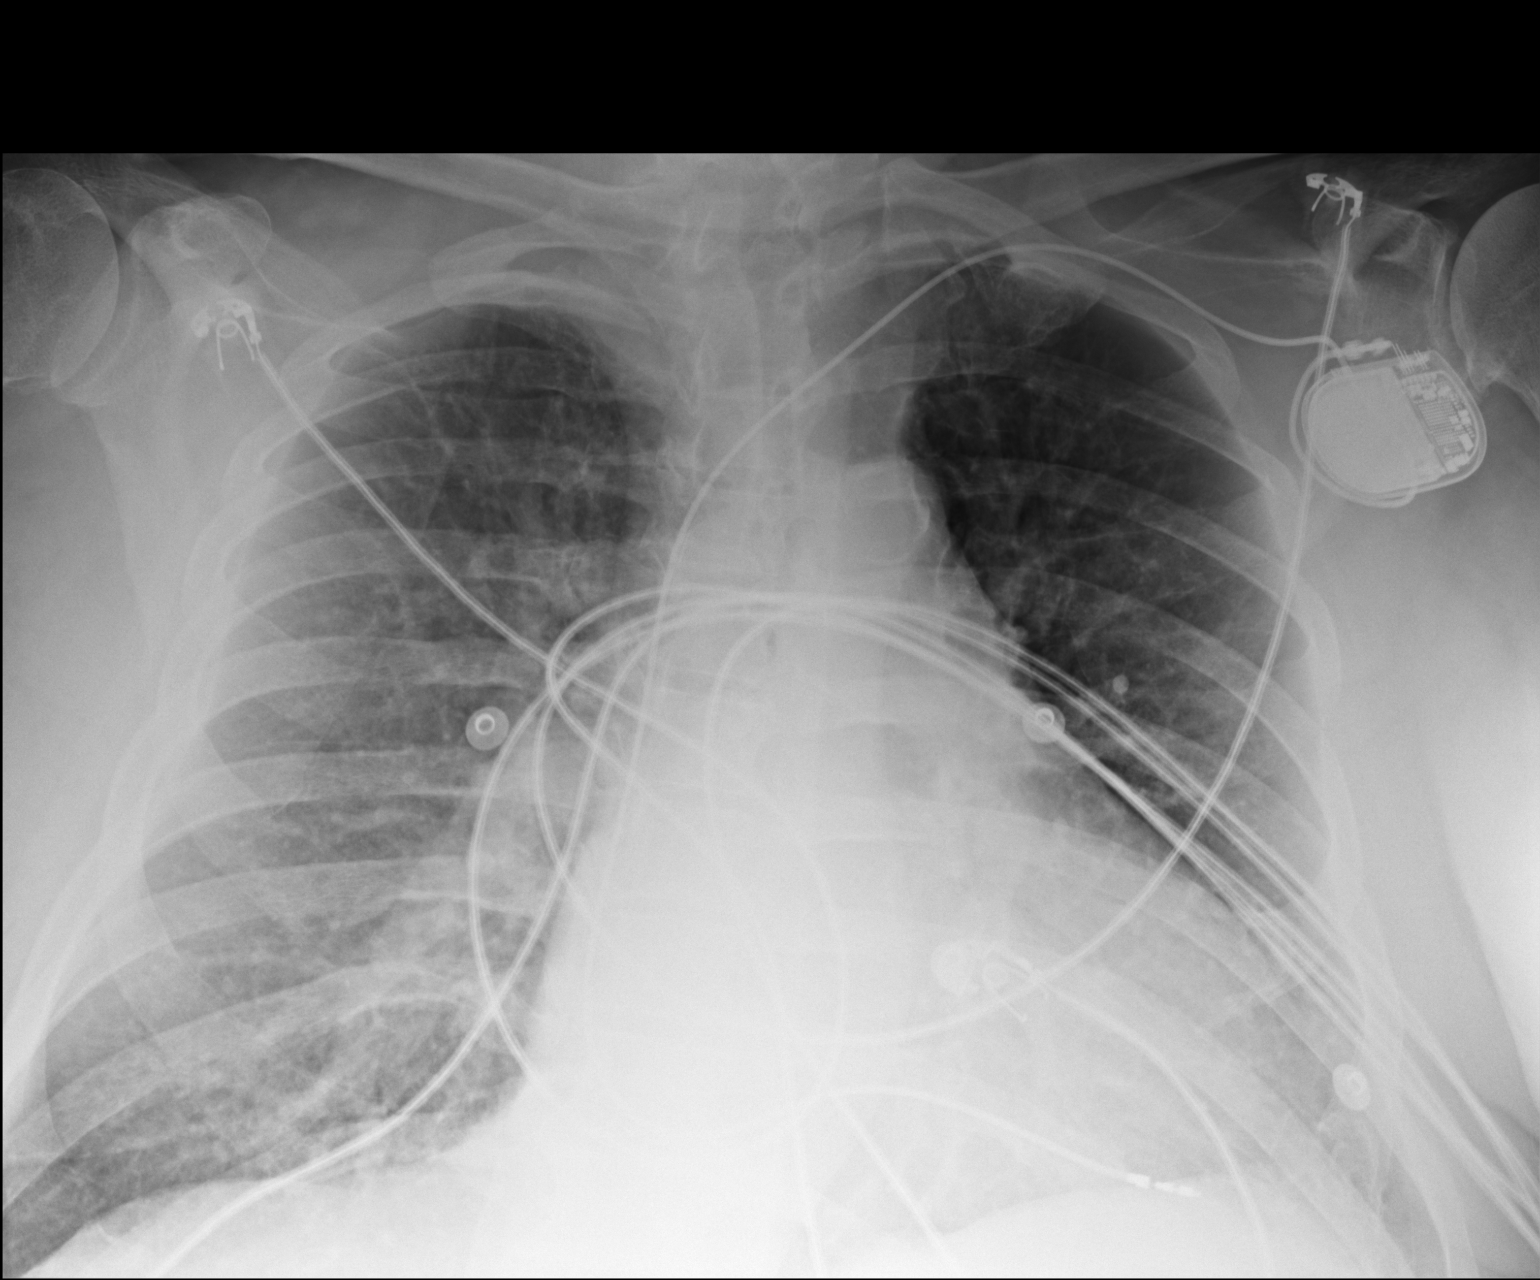

[AP (2 of 2)]
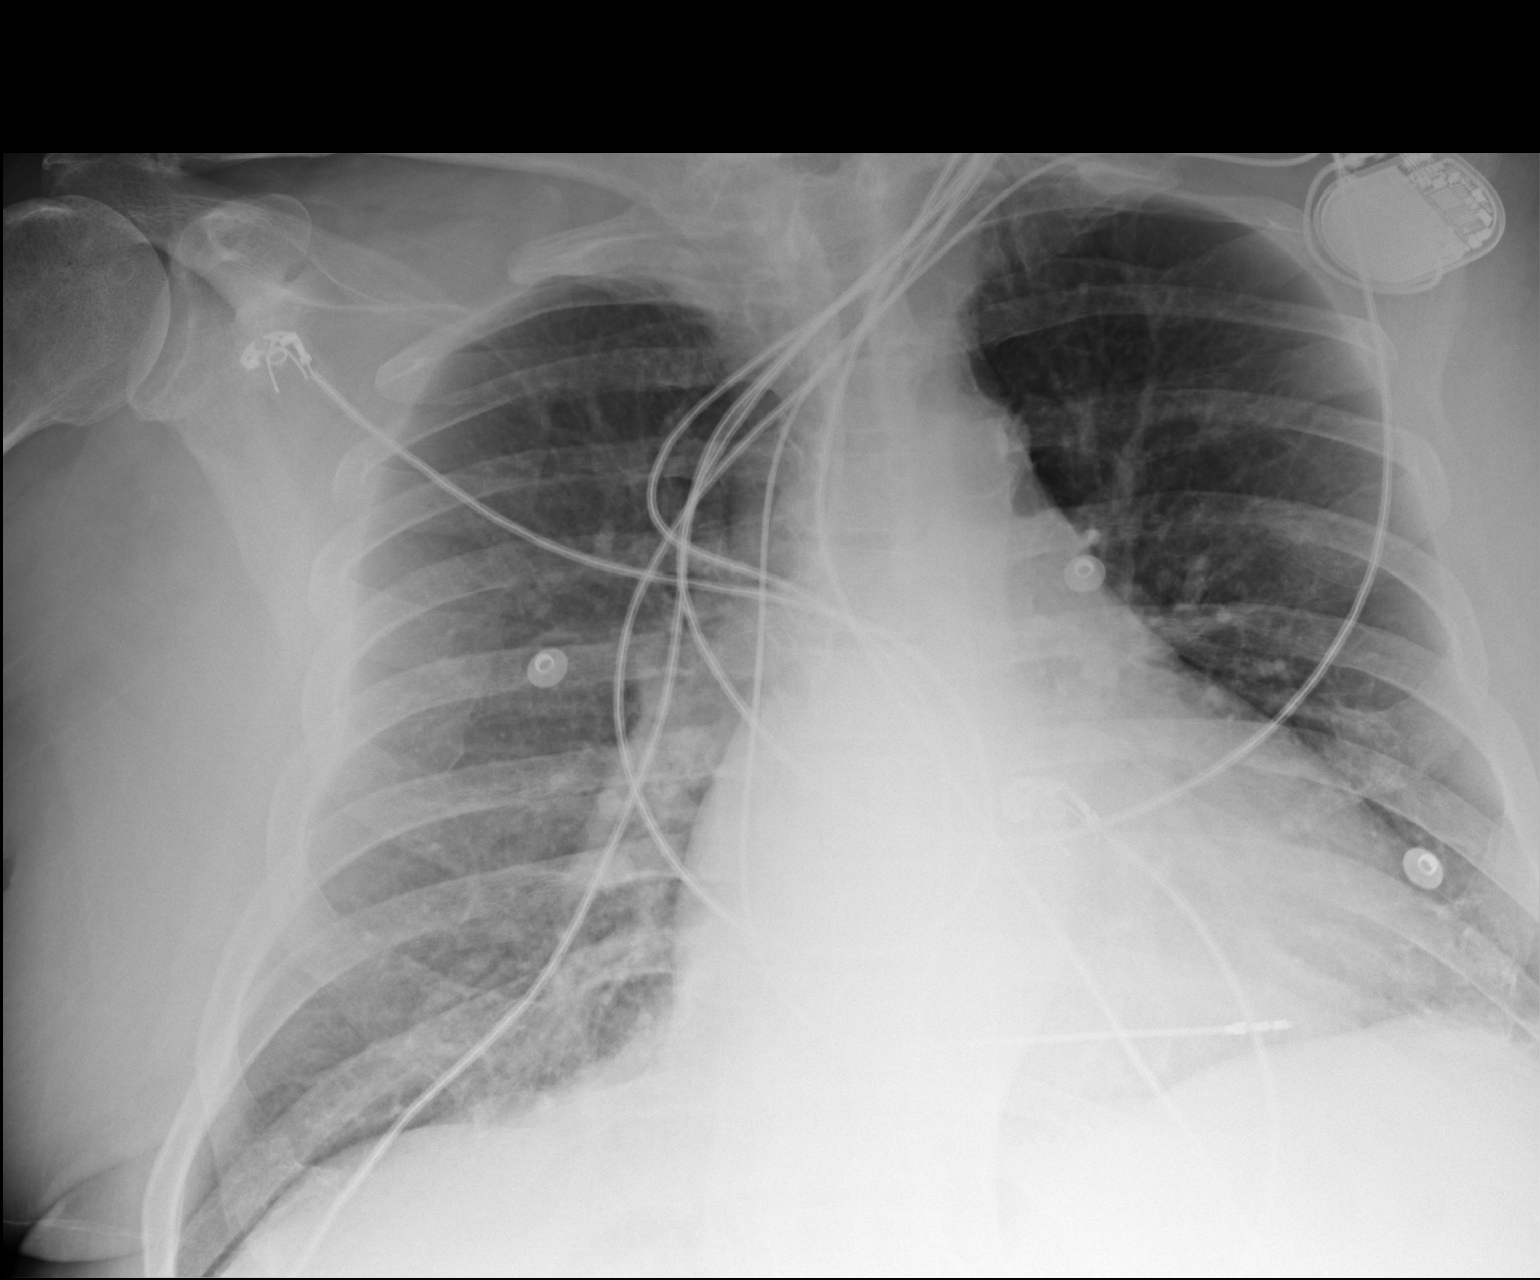

[2 of 2 positions shown; findings below may reference images not displayed]

FINDINGS: The lungs are well-aerated. Vascular congestion is noted. Increased
interstitial markings raise concern for mild pulmonary edema. No
pleural effusion or pneumothorax is seen.

The cardiomediastinal silhouette is mildly enlarged. A pacemaker is
noted overlying the left chest wall, with a single lead ending
overlying the right ventricle. No acute osseous abnormalities are
seen.
IMPRESSION: Vascular congestion and mild cardiomegaly. Increased interstitial
markings raise concern for mild pulmonary edema.

## 2017-08-05 IMAGING — CT CT HEAD CODE STROKE
3 of 4 series · 15 of 47 positions shown, 18 images · non-contrast
Comparison: None.

CLINICAL DATA: Acute onset of altered mental status and headache.
Confusion. Code stroke. Initial encounter.

EXAM:
CT HEAD WITHOUT CONTRAST
TECHNIQUE: Contiguous axial images were obtained from the base of the skull
through the vertex without intravenous contrast.

[Series 201: head w/o, idose (1) · axial · non-contrast · 0.52mm/px · z∈[-138,+17]mm · 9 of 37 slices shown, 12 images]
[im 3/37  brain]
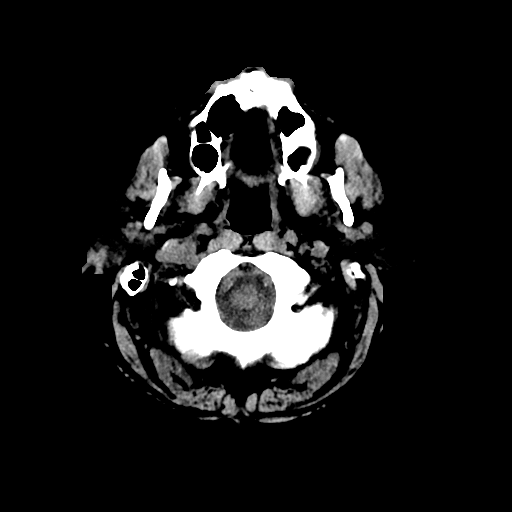
[im 3/37  bone]
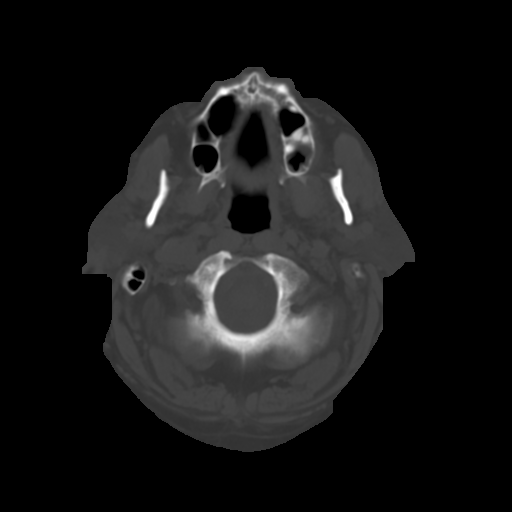
[im 8/37  brain]
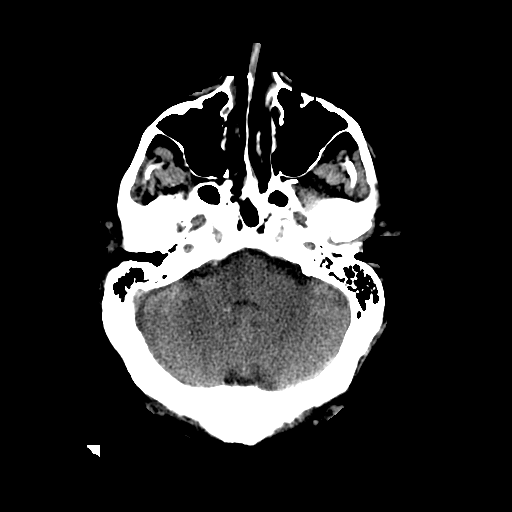
[im 11/37  brain]
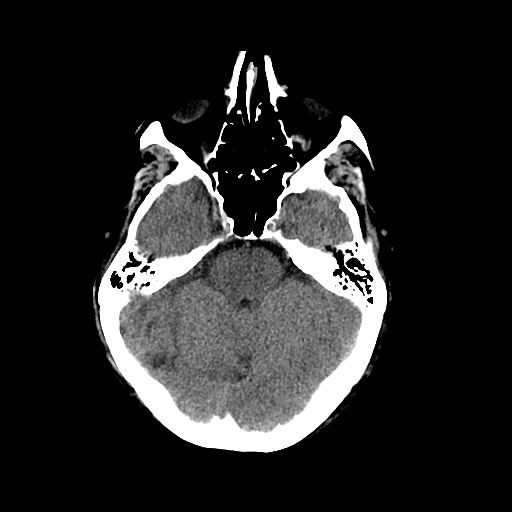
[im 16/37  brain]
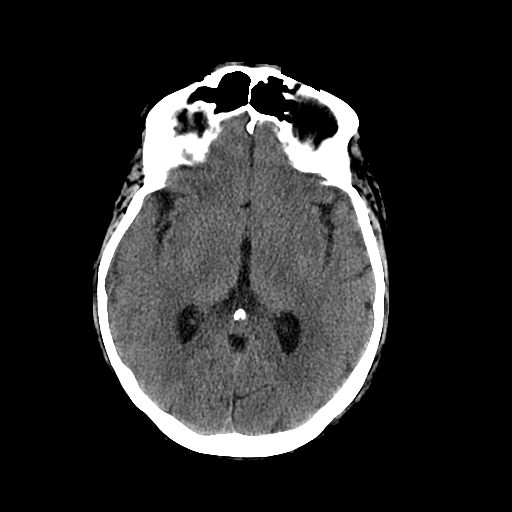
[im 19/37  brain]
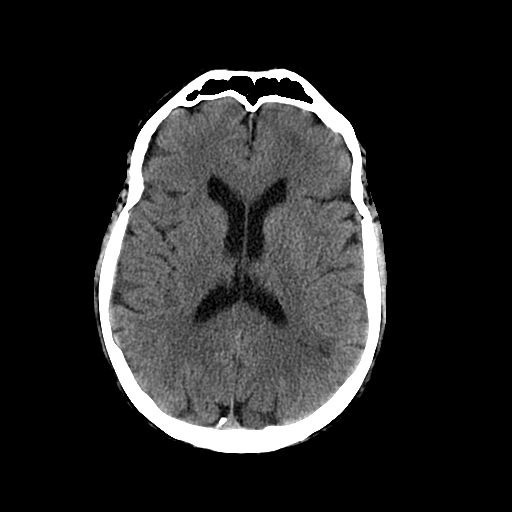
[im 19/37  bone]
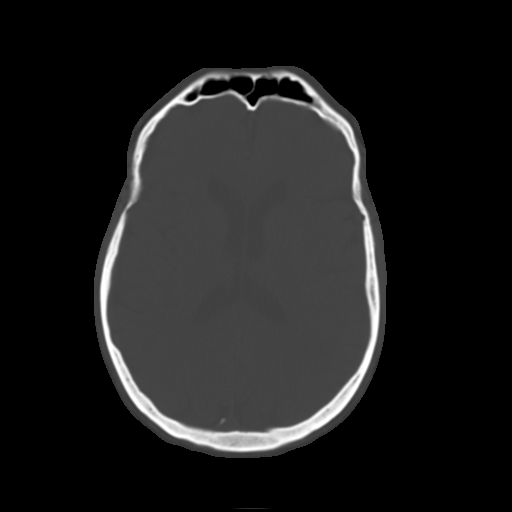
[im 21/37  brain]
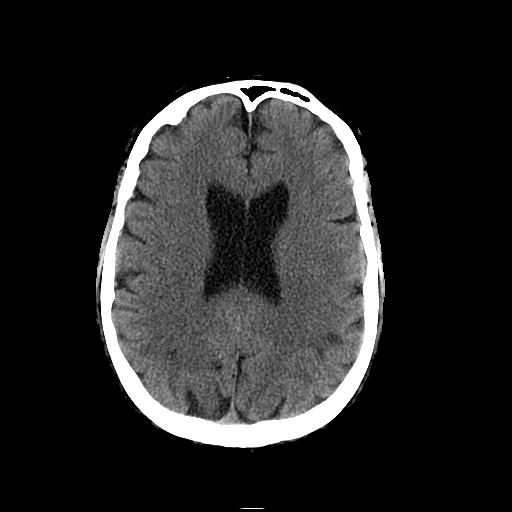
[im 26/37  brain]
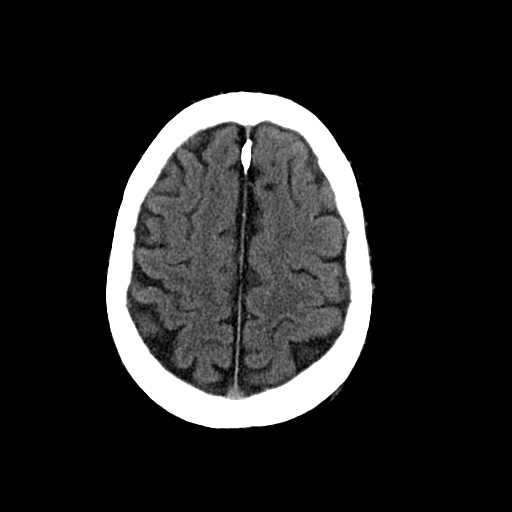
[im 29/37  brain]
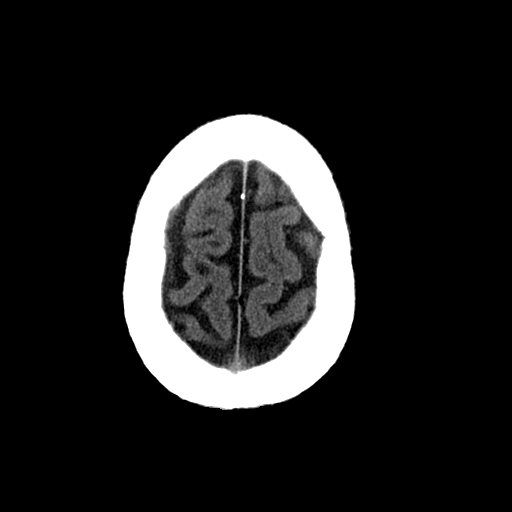
[im 34/37  brain]
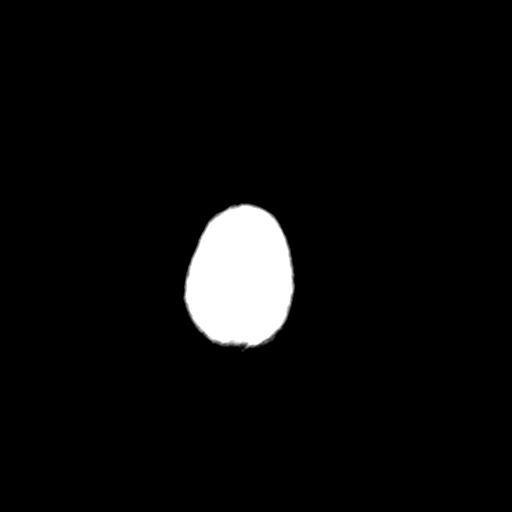
[im 34/37  bone]
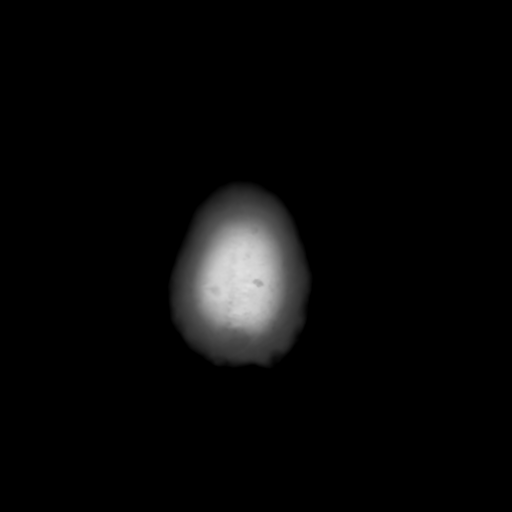

[Series 203: coronal st, idose (1) · coronal · 0.40mm/px · 3 of 89 slices shown]
[im 30/89  brain]
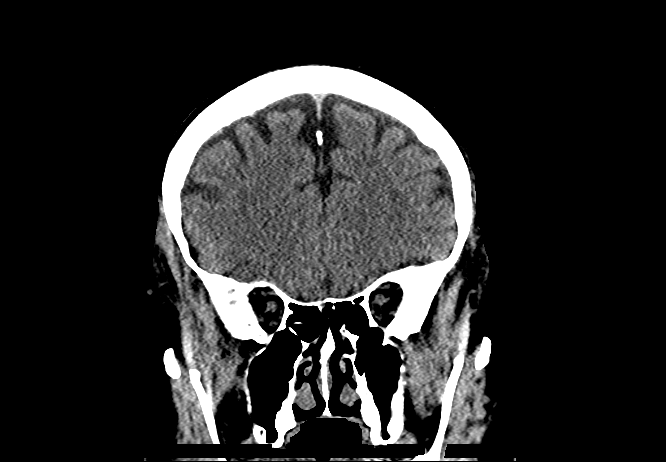
[im 40/89  brain]
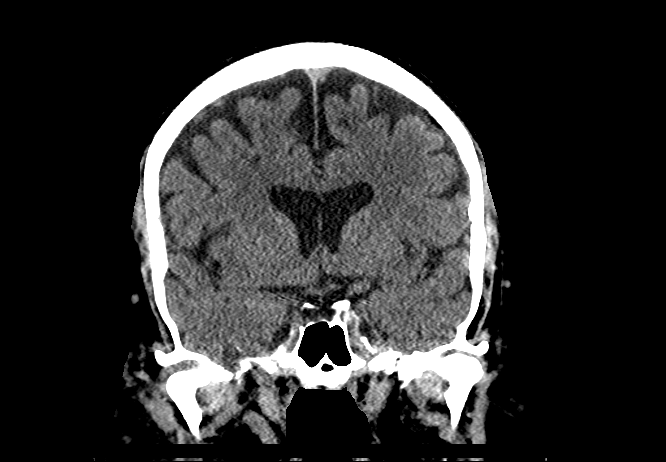
[im 49/89  brain]
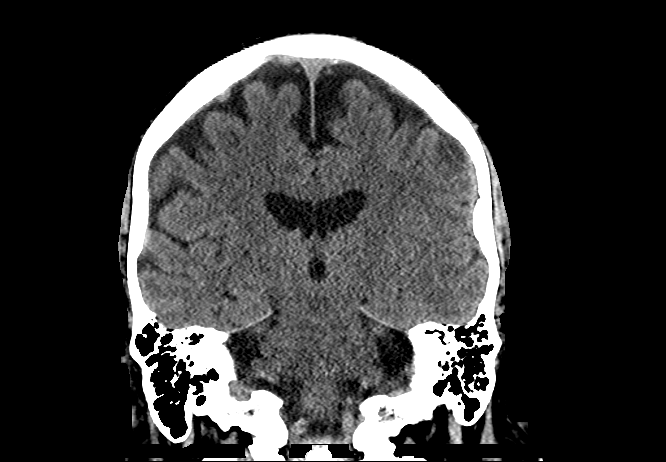

[Series 204: sagittal st, idose (1) · sagittal · 0.40mm/px · 3 of 89 slices shown]
[im 30/89  brain]
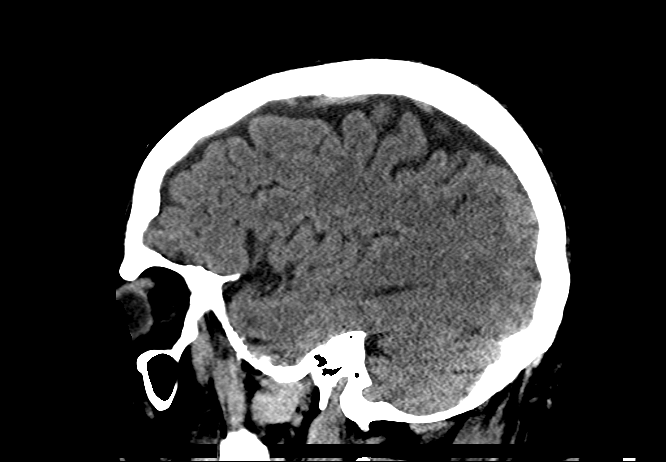
[im 45/89  brain]
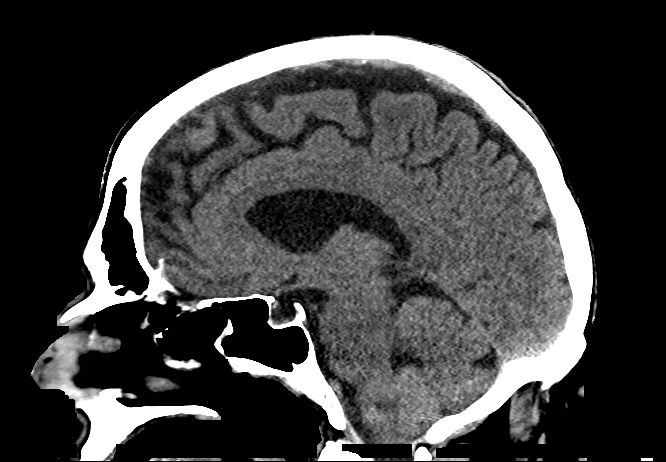
[im 59/89  brain]
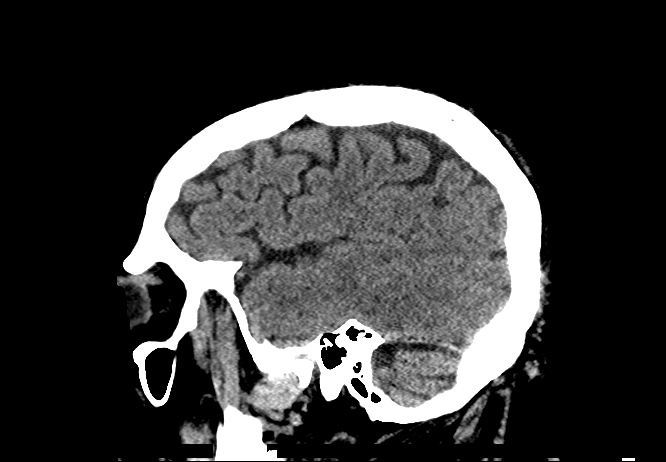

[15 of 47 positions shown; findings below may reference images not displayed]

FINDINGS: There is no evidence of acute infarction, mass lesion, or intra- or
extra-axial hemorrhage on CT.

Prominence of the ventricles and sulci reflects mild cortical volume
loss. Mild cerebellar atrophy is noted. A small chronic infarct is
noted at the right occipital lobe. Small chronic lacunar infarcts
are noted in the external capsule bilaterally. Scattered foci of
decreased attenuation at the white matter of the convexity may
reflect remote injury or infarct.

The brainstem and fourth ventricle are within normal limits. The
cerebral hemispheres demonstrate grossly normal gray-white
differentiation. No mass effect or midline shift is seen.

There is no evidence of fracture; visualized osseous structures are
unremarkable in appearance. The orbits are within normal limits. The
paranasal sinuses and mastoid air cells are well-aerated. No
significant soft tissue abnormalities are seen.
IMPRESSION: 1. No acute intracranial pathology seen on CT.
2. Small chronic infarct at the right occipital lobe. Small chronic
lacunar infarcts at the external capsule bilaterally.
3. Scattered foci of decreased attenuation at the white matter of
the convexity may reflect remote injury or infarct.
4. Mild cortical volume loss noted.
These results were called by telephone at the time of interpretation
on 10/16/2015 at [DATE] to Dr. JIMPSON ZHAMUNGUI, who verbally
acknowledged these results.
